# Patient Record
Sex: Male | Born: 1970 | Race: Black or African American | Hispanic: No | Marital: Single
Health system: Western US, Academic
[De-identification: ages and names within clinical notes are randomized; demographics above are authoritative.]

## PROBLEM LIST (undated history)

## (undated) DIAGNOSIS — I255 Ischemic cardiomyopathy: Secondary | ICD-10-CM

## (undated) DIAGNOSIS — I509 Heart failure, unspecified: Secondary | ICD-10-CM

## (undated) DIAGNOSIS — E785 Hyperlipidemia, unspecified: Secondary | ICD-10-CM

## (undated) DIAGNOSIS — Z95810 Presence of automatic (implantable) cardiac defibrillator: Secondary | ICD-10-CM

## (undated) DIAGNOSIS — I10 Essential (primary) hypertension: Secondary | ICD-10-CM

## (undated) DIAGNOSIS — E119 Type 2 diabetes mellitus without complications: Secondary | ICD-10-CM

## (undated) DIAGNOSIS — J8489 Other specified interstitial pulmonary diseases: Secondary | ICD-10-CM

## (undated) DIAGNOSIS — G8929 Other chronic pain: Secondary | ICD-10-CM

## (undated) HISTORY — DX: Essential (primary) hypertension: I10

## (undated) HISTORY — DX: Ischemic cardiomyopathy: I25.5

## (undated) HISTORY — DX: Hyperlipidemia, unspecified: E78.5

## (undated) HISTORY — DX: Presence of automatic (implantable) cardiac defibrillator: Z95.810

## (undated) HISTORY — PX: CHOLECYSTECTOMY: SHX55

---

## 2002-11-28 DIAGNOSIS — E1142 Type 2 diabetes mellitus with diabetic polyneuropathy: Secondary | ICD-10-CM | POA: Insufficient documentation

## 2019-09-10 HISTORY — PX: CARDIAC DEFIBRILLATOR PLACEMENT: SHX171

## 2020-09-21 ENCOUNTER — Encounter: Payer: Self-pay | Admitting: *Deleted

## 2020-09-21 ENCOUNTER — Emergency Department
Admission: EM | Admit: 2020-09-21 | Discharge: 2020-09-21 | Disposition: A | Discharge status: Home / Self Care | Payer: Self-pay | Attending: Emergency Medicine | Admitting: Emergency Medicine

## 2020-09-21 ENCOUNTER — Other Ambulatory Visit: Payer: Self-pay

## 2020-09-21 DIAGNOSIS — F17200 Nicotine dependence, unspecified, uncomplicated: Secondary | ICD-10-CM | POA: Insufficient documentation

## 2020-09-21 DIAGNOSIS — R112 Nausea with vomiting, unspecified: Secondary | ICD-10-CM | POA: Insufficient documentation

## 2020-09-21 DIAGNOSIS — R1084 Generalized abdominal pain: Secondary | ICD-10-CM | POA: Insufficient documentation

## 2020-09-21 DIAGNOSIS — M549 Dorsalgia, unspecified: Secondary | ICD-10-CM | POA: Insufficient documentation

## 2020-09-21 DIAGNOSIS — R197 Diarrhea, unspecified: Secondary | ICD-10-CM | POA: Insufficient documentation

## 2020-09-21 LAB — URINALYSIS, COMPLETE (UACMP) WITH MICROSCOPIC
Bacteria, UA: NONE SEEN
Bilirubin Urine: NEGATIVE
Glucose, UA: NEGATIVE mg/dL
Ketones, ur: NEGATIVE mg/dL
Leukocytes,Ua: NEGATIVE
Nitrite: NEGATIVE
Protein, ur: >=300 mg/dL — AB
Specific Gravity, Urine: 1.031 — ABNORMAL HIGH (ref 1.005–1.030)
pH: 5 (ref 5.0–8.0)

## 2020-09-21 LAB — COMPREHENSIVE METABOLIC PANEL
ALT: 57 U/L — ABNORMAL HIGH (ref 0–44)
AST: 38 U/L (ref 15–41)
Albumin: 3 g/dL — ABNORMAL LOW (ref 3.5–5.0)
Alkaline Phosphatase: 312 U/L — ABNORMAL HIGH (ref 38–126)
Anion gap: 8 (ref 5–15)
BUN: 16 mg/dL (ref 6–20)
CO2: 23 mmol/L (ref 22–32)
Calcium: 7.5 mg/dL — ABNORMAL LOW (ref 8.9–10.3)
Chloride: 108 mmol/L (ref 98–111)
Creatinine, Ser: 0.69 mg/dL (ref 0.61–1.24)
GFR, Estimated: >60 mL/min (ref >60)
Glucose, Bld: 135 mg/dL — ABNORMAL HIGH (ref 70–99)
Potassium: 3.6 mmol/L (ref 3.5–5.1)
Sodium: 139 mmol/L (ref 135–145)
Total Bilirubin: 1 mg/dL (ref 0.3–1.2)
Total Protein: 6.9 g/dL (ref 6.5–8.1)

## 2020-09-21 LAB — CBC
HCT: 39.2 % (ref 39.0–52.0)
Hemoglobin: 12.4 g/dL — ABNORMAL LOW (ref 13.0–17.0)
MCH: 29 pg (ref 26.0–34.0)
MCHC: 31.6 g/dL (ref 30.0–36.0)
MCV: 91.8 fL (ref 80.0–100.0)
Platelets: 238 10*3/uL (ref 150–400)
RBC: 4.27 MIL/uL (ref 4.22–5.81)
RDW: 15.5 % (ref 11.5–15.5)
WBC: 3.5 10*3/uL — ABNORMAL LOW (ref 4.0–10.5)
nRBC: 0 % (ref 0.0–0.2)

## 2020-09-21 LAB — LIPASE, BLOOD: Lipase: 30 U/L (ref 11–51)

## 2020-09-21 MED ORDER — PROMETHAZINE HCL 12.5 MG PO TABS: 12.5000 mg | ORAL_TABLET | Freq: Three times a day (TID) | ORAL | 0 refills | Status: DC | PRN

## 2020-09-21 MED ORDER — OMEPRAZOLE 20 MG PO CPDR: 20.0000 mg | DELAYED_RELEASE_CAPSULE | Freq: Every day | ORAL | 0 refills | Status: DC

## 2020-09-21 NOTE — ED Triage Notes (Signed)
Pt ambulatory to triage.  Pt has vomiting and diarrhea.  Hx pancreatitis.  Sx for 3 days.  Diarrhea x 3 today.  Pt alert speech clear.

## 2020-09-21 NOTE — ED Provider Notes (Signed)
[facility] Emergency Department Provider Note  ____________________________________________   First MD Initiated Contact with Patient 09/21/20 2131     (approximate)  I have reviewed the triage vital signs and the nursing notes.   HISTORY  Chief Complaint Emesis and Diarrhea    HPI Travis Lucas is a 49 y.o. male  Here with nausea, vomiting, diarrhea. Pt states that starting Saturday, he has had mild diffuse abdominal and back pain, nausea, vomiting, and diarrhea. He repots a h/o pancreatitis and states his current sx feel similar to his prior episodes. Reports the pain is mild, not particularly worse with eating or drinking, and non-localized though he feels it slightly more in the back. No flank pain, dysuria, frequency. Pain is not constant. No urinary sx. He has had some loose stools but no blood or mucus in stools. His emesis is random and he's been able to eat/drink without difficulty. He states that last time he had these sx, he took an OTC antacid which "worked [employer]."        No past medical history on file.  PMHx: Non contributory  PSHx: None  SHx: occasional EtOH  There are no problems to display for this patient.     Prior to Admission medications   Medication Sig Start Date End Date Taking? Authorizing Provider  omeprazole (PRILOSEC) 20 MG capsule Take 1 capsule (20 mg total) by mouth daily for 14 days. 09/21/20 10/05/20  Shaune Pollack, MD  promethazine (PHENERGAN) 12.5 MG tablet Take 1 tablet (12.5 mg total) by mouth every 8 (eight) hours as needed for nausea or vomiting. 09/21/20   Shaune Pollack, MD    Allergies Penicillins  No family history on file.  Social History Social History   Tobacco Use  . Smoking status: Current Every Day Smoker  . Smokeless tobacco: Never Used  Substance Use Topics  . Alcohol use: Yes  . Drug use: Not Currently    Review of Systems  Review of Systems  Constitutional: Positive for  fatigue. Negative for chills and fever.  HENT: Negative for sore throat.   Respiratory: Negative for shortness of breath.   Cardiovascular: Negative for chest pain.  Gastrointestinal: Positive for abdominal pain, diarrhea, nausea and vomiting.  Genitourinary: Negative for flank pain.  Musculoskeletal: Negative for neck pain.  Skin: Negative for rash and wound.  Allergic/Immunologic: Negative for immunocompromised state.  Neurological: Negative for weakness and numbness.  Hematological: Does not bruise/bleed easily.  All other systems reviewed and are negative.    ____________________________________________  PHYSICAL EXAM:      VITAL SIGNS: ED Triage Vitals  Enc Vitals Group     BP 09/21/20 1830 128/85     Pulse Rate 09/21/20 1830 100     Resp 09/21/20 1830 20     Temp 09/21/20 1830 98.2 F (36.8 C)     Temp Source 09/21/20 1830 Oral     SpO2 09/21/20 1830 100 %     Weight 09/21/20 1831 180 lb (81.6 kg)     Height 09/21/20 1831 6\' 3"  (1.905 m)     Head Circumference --      Peak Flow --      Pain Score 09/21/20 1831 4     Pain Loc --      Pain Edu? --      Excl. in GC? --      Physical Exam Vitals and nursing note reviewed.  Constitutional:      General: He is not in acute  distress.    Appearance: He is well-developed.  HENT:     Head: Normocephalic and atraumatic.  Eyes:     Conjunctiva/sclera: Conjunctivae normal.  Cardiovascular:     Rate and Rhythm: Normal rate and regular rhythm.     Heart sounds: Normal heart sounds.  Pulmonary:     Effort: Pulmonary effort is normal. No respiratory distress.     Breath sounds: No wheezing.  Abdominal:     General: Abdomen is flat. There is no distension.     Tenderness: There is no abdominal tenderness. There is no guarding or rebound.     Comments: No focal TTP. No CVAT Bilaterally.  Musculoskeletal:     Cervical back: Neck supple.  Skin:    General: Skin is warm.     Capillary Refill: Capillary refill takes less  than 2 seconds.     Findings: No rash.  Neurological:     Mental Status: He is alert and oriented to person, place, and time.     Motor: No abnormal muscle tone.       ____________________________________________   LABS (all labs ordered are listed, but only abnormal results are displayed)  Labs Reviewed  COMPREHENSIVE METABOLIC PANEL - Abnormal; Notable for the following components:      Result Value   Glucose, Bld 135 (*)    Calcium 7.5 (*)    Albumin 3.0 (*)    ALT 57 (*)    Alkaline Phosphatase 312 (*)    All other components within normal limits  CBC - Abnormal; Notable for the following components:   WBC 3.5 (*)    Hemoglobin 12.4 (*)    All other components within normal limits  URINALYSIS, COMPLETE (UACMP) WITH MICROSCOPIC - Abnormal; Notable for the following components:   Color, Urine AMBER (*)    APPearance HAZY (*)    Specific Gravity, Urine 1.031 (*)    Hgb urine dipstick SMALL (*)    Protein, ur >=300 (*)    All other components within normal limits  LIPASE, BLOOD    ____________________________________________  EKG:  ________________________________________  RADIOLOGY All imaging, including plain films, CT scans, and ultrasounds, independently reviewed by me, and interpretations confirmed via formal radiology reads.  ED MD interpretation:     Official radiology report(s): No results found.  ____________________________________________  PROCEDURES   Procedure(s) performed (including Critical Care):  Procedures  ____________________________________________  INITIAL IMPRESSION / MDM / ASSESSMENT AND PLAN / ED COURSE  As part of my medical decision making, I reviewed the following data within the electronic MEDICAL RECORD NUMBER Nursing notes reviewed and incorporated, Old chart reviewed, Notes from prior ED visits, and [place] Controlled Substance Database       *Yedidya Duddy was evaluated in Emergency Department on 09/21/2020 for the symptoms  described in the history of present illness. He was evaluated in the context of the global COVID-19 pandemic, which necessitated consideration that the patient might be at risk for infection with the SARS-CoV-2 virus that causes COVID-19. Institutional protocols and algorithms that pertain to the evaluation of patients at risk for COVID-19 are in a state of rapid change based on information released by regulatory bodies including the CDC and federal and state organizations. These policies and algorithms were followed during the patient's care in the ED.  Some ED evaluations and interventions may be delayed as a result of limited staffing during the pandemic.*     Medical Decision Making:  49 yo M here with n/v/d, mild diffuse abd  pain. On arrival, pt in no distress with no focal abd TTP. Labs show minimal anemia, slight ALT/AlkP elevation though normal LFTs and Bili. Hypocalcemia noted but only mild when corrected for albumin. Lipase is normal. UA c/w dehydration but otherwise not significant. Possible small blood noted - flank pain is b/l mild, doubt stone. No focal TTP to suggest cholecystitis, appendicitis, diverticulitis.  Discussed labs, sx with pt. Discussed that primary suspicion is viral GI illness vs recurrent gastritis/GERD vs food-borne illness. However, given his h/o prior pancreatitis, offered imaging for further evaluation. Pt states he feels much better on his own and declines further imaging. He would like to attempt outpt management. Will d/c with antacids, antiemetics and good return precautions.  ____________________________________________  FINAL CLINICAL IMPRESSION(S) / ED DIAGNOSES  Final diagnoses:  Nausea vomiting and diarrhea  Generalized abdominal pain     MEDICATIONS GIVEN DURING THIS VISIT:  Medications - No data to display   ED Discharge Orders         Ordered    omeprazole (PRILOSEC) 20 MG capsule  Daily        09/21/20 2146    promethazine (PHENERGAN) 12.5 MG  tablet  Every 8 hours PRN        09/21/20 2146           Note:  This document was prepared using Dragon voice recognition software and may include unintentional dictation errors.   Shaune Pollack, MD 09/21/20 2157

## 2020-09-21 NOTE — Discharge Instructions (Signed)
You can take over-the-counter alternatives for antacids if needed/not covered by the pharmacy.  For your pain/indigestion: - Take a generic antacid such as omeprazole, lansoprazole, esomeprazole (PPI medications). The cheapest generic should be more than enough to help. - If severe, take Tums or Maalox as needed  Avoid alcohol and foods high in fat  Drink plenty of fluid

## 2020-09-26 ENCOUNTER — Other Ambulatory Visit: Payer: Self-pay

## 2020-09-26 ENCOUNTER — Emergency Department: Payer: Self-pay

## 2020-09-26 ENCOUNTER — Emergency Department
Admission: EM | Admit: 2020-09-26 | Discharge: 2020-09-26 | Disposition: A | Discharge status: Home / Self Care | Payer: Self-pay | Attending: Emergency Medicine | Admitting: Emergency Medicine

## 2020-09-26 ENCOUNTER — Encounter: Payer: Self-pay | Admitting: Emergency Medicine

## 2020-09-26 DIAGNOSIS — F17200 Nicotine dependence, unspecified, uncomplicated: Secondary | ICD-10-CM | POA: Insufficient documentation

## 2020-09-26 DIAGNOSIS — R0602 Shortness of breath: Secondary | ICD-10-CM | POA: Insufficient documentation

## 2020-09-26 LAB — CBC
HCT: 34.4 % — ABNORMAL LOW (ref 39.0–52.0)
Hemoglobin: 11.1 g/dL — ABNORMAL LOW (ref 13.0–17.0)
MCH: 28.8 pg (ref 26.0–34.0)
MCHC: 32.3 g/dL (ref 30.0–36.0)
MCV: 89.4 fL (ref 80.0–100.0)
Platelets: 241 10*3/uL (ref 150–400)
RBC: 3.85 MIL/uL — ABNORMAL LOW (ref 4.22–5.81)
RDW: 15 % (ref 11.5–15.5)
WBC: 4.8 10*3/uL (ref 4.0–10.5)
nRBC: 0 % (ref 0.0–0.2)

## 2020-09-26 LAB — TROPONIN I (HIGH SENSITIVITY): Troponin I (High Sensitivity): 17 ng/L (ref <18)

## 2020-09-26 LAB — COMPREHENSIVE METABOLIC PANEL
ALT: 36 U/L (ref 0–44)
AST: 28 U/L (ref 15–41)
Albumin: 2.5 g/dL — ABNORMAL LOW (ref 3.5–5.0)
Alkaline Phosphatase: 391 U/L — ABNORMAL HIGH (ref 38–126)
Anion gap: 7 (ref 5–15)
BUN: 7 mg/dL (ref 6–20)
CO2: 22 mmol/L (ref 22–32)
Calcium: 7.3 mg/dL — ABNORMAL LOW (ref 8.9–10.3)
Chloride: 110 mmol/L (ref 98–111)
Creatinine, Ser: 0.74 mg/dL (ref 0.61–1.24)
GFR, Estimated: >60 mL/min (ref >60)
Glucose, Bld: 170 mg/dL — ABNORMAL HIGH (ref 70–99)
Potassium: 3.2 mmol/L — ABNORMAL LOW (ref 3.5–5.1)
Sodium: 139 mmol/L (ref 135–145)
Total Bilirubin: 0.7 mg/dL (ref 0.3–1.2)
Total Protein: 5.9 g/dL — ABNORMAL LOW (ref 6.5–8.1)

## 2020-09-26 MED ORDER — DOXYCYCLINE HYCLATE 50 MG PO CAPS: 100.0000 mg | ORAL_CAPSULE | Freq: Two times a day (BID) | ORAL | 0 refills | Status: AC

## 2020-09-26 NOTE — ED Notes (Signed)
Reviewed discharge instructions, follow-up care, and prescriptions with patient. Patient verbalized understanding of all information reviewed. Patient stable, with no distress noted at this time.  Patient brought snack and beverage prior to discharge by this RN.

## 2020-09-26 NOTE — ED Provider Notes (Addendum)
[facility] Emergency Department Provider Note   ____________________________________________   First MD Initiated Contact with Patient 09/26/20 0406     (approximate)  I have reviewed the triage vital signs and the nursing notes.   HISTORY  Chief Complaint Shortness of Breath    HPI Travis Lucas is a 49 y.o. 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  Patient states he has had multiple symptoms within the past 2 weeks including palpitations, shortness of breath, generalized weakness, a bleeding lesion to the crown of his head, and subjective fever.  Patient states that he was recently admitted to hospital in [facility] where he was receiving IV antibiotics for a "MRSA blood infection".  Patient states that he was in 3/4 weeks of IV treatment with a PICC line when he was presented the opportunity to use an experimental antibiotic.  Patient states that he got his first dose but was scheduled to have the second yesterday and was unable to receive it as he was not in [facility].  Patient is unclear as to his exact diagnosis.  Patient denies any IV drug use, known history of HIV, or any personal/family history of immunocompromising syndromes         History reviewed. No pertinent past medical history.  There are no problems to display for this patient.     Prior to Admission medications   Medication Sig Start Date End Date Taking? Authorizing Provider  doxycycline (VIBRAMYCIN) 50 MG capsule Take 2 capsules (100 mg total) by mouth 2 (two) times daily for 5 days. 09/26/20 10/01/20  Merwyn Katos, MD  omeprazole (PRILOSEC) 20 MG capsule Take 1 capsule (20 mg total) by mouth daily for 14 days. 09/21/20 10/05/20  Shaune Pollack, MD  promethazine (PHENERGAN) 12.5 MG tablet Take 1 tablet (12.5 mg total) by mouth every 8 (eight) hours as needed for nausea or vomiting. 09/21/20    Shaune Pollack, MD    Allergies Penicillins  No family history on file.  Social History Social History   Tobacco Use  . Smoking status: Current Every Day Smoker  . Smokeless tobacco: Never Used  Substance Use Topics  . Alcohol use: Yes  . Drug use: Not Currently    Review of Systems Constitutional: Positive fever/chills Eyes: No visual changes. ENT: No sore throat. Cardiovascular: Endorses chest pain. Respiratory: Endorses shortness of breath. Gastrointestinal: No abdominal pain.  No nausea, no vomiting.  No diarrhea. Genitourinary: Negative for dysuria. Musculoskeletal: Negative for acute arthralgias Skin: Negative for rash. Neurological: Negative for headaches, numbness/paresthesias in any extremity Psychiatric: Negative for suicidal ideation/homicidal ideation   ____________________________________________   PHYSICAL EXAM:  VITAL SIGNS: ED Triage Vitals  Enc Vitals Group     BP 09/26/20 0103 134/88     Pulse Rate 09/26/20 0103 100     Resp 09/26/20 0103 18     Temp 09/26/20 0103 98.1 F (36.7 C)     Temp Source 09/26/20 0103 Oral     SpO2 09/26/20 0103 97 %     Weight 09/26/20 0104 185 lb (83.9 kg)     Height 09/26/20 0104 6\' 3"  (1.905 m)     Head Circumference --      Peak Flow --      Pain Score 09/26/20 0104 7     Pain Loc --      Pain Edu? --      Excl.  in GC? --    Constitutional: Alert and oriented. Well appearing and in no acute distress. Eyes: Conjunctivae are normal. PERRL. Head: Atraumatic. Nose: No congestion/rhinnorhea. Mouth/Throat: Mucous membranes are moist. Neck: No stridor Cardiovascular: Grossly normal heart sounds.  Good peripheral circulation. Respiratory: Normal respiratory effort.  No retractions. Gastrointestinal: Soft and nontender. No distention. Musculoskeletal: No obvious deformities Neurologic:  Normal speech and language. No gross focal neurologic deficits are appreciated. Skin:  Skin is warm and dry. No rash  noted. Psychiatric: Mood and affect are normal. Speech and behavior are normal.  ____________________________________________   LABS (all labs ordered are listed, but only abnormal results are displayed)  Labs Reviewed  CBC - Abnormal; Notable for the following components:      Result Value   RBC 3.85 (*)    Hemoglobin 11.1 (*)    HCT 34.4 (*)    All other components within normal limits  COMPREHENSIVE METABOLIC PANEL - Abnormal; Notable for the following components:   Potassium 3.2 (*)    Glucose, Bld 170 (*)    Calcium 7.3 (*)    Total Protein 5.9 (*)    Albumin 2.5 (*)    Alkaline Phosphatase 391 (*)    All other components within normal limits  TROPONIN I (HIGH SENSITIVITY)  TROPONIN I (HIGH SENSITIVITY)   ____________________________________________  EKG  ED ECG REPORT I, Merwyn Katos, the attending physician, personally viewed and interpreted this ECG.  Date: 09/26/2020 EKG Time: 0104 Rate: 101 Rhythm: Tachycardic sinus rhythm QRS Axis: normal Intervals: normal ST/T Wave abnormalities: normal Narrative Interpretation: no evidence of acute ischemia  ____________________________________________  RADIOLOGY  ED MD interpretation: 2 view x-ray of the chest shows mild cardiomegaly without any significant evidence of acute cardiopulmonary pathology.  No evidence of pneumonia, pneumothorax, or widened mediastinum  Official radiology report(s): DG Chest 2 View  Result Date: 09/26/2020 CLINICAL DATA:  Dyspnea EXAM: CHEST - 2 VIEW COMPARISON:  None. FINDINGS: Lungs are clear. No pneumothorax or pleural effusion. Left subclavian dual lead pacemaker defibrillator is in expected position. Mild cardiomegaly. Pulmonary vascularity is normal. No acute bone abnormality. IMPRESSION: No active cardiopulmonary disease.  Mild cardiomegaly. Electronically Signed   By: Helyn Numbers MD   On: 09/26/2020 02:05     ____________________________________________   PROCEDURES  Procedure(s) performed (including Critical Care):  .1-3 Lead EKG Interpretation Performed by: Merwyn Katos, MD Authorized by: Merwyn Katos, MD     Interpretation: normal     ECG rate:  93   ECG rate assessment: normal     Rhythm: sinus rhythm     Ectopy: none     Conduction: normal       ____________________________________________   INITIAL IMPRESSION / ASSESSMENT AND PLAN / ED COURSE  As part of my medical decision making, I reviewed the following data within the electronic MEDICAL RECORD NUMBER Nursing notes reviewed and incorporated, Labs reviewed, EKG interpreted, Old chart reviewed, Radiograph reviewed and Notes from prior ED visits reviewed and incorporated        The patient is suffering from shortness of breath, but the immediate cause is not apparent.  Potential causes considered include, but are not limited to, asthma or COPD, congestive heart failure, pulmonary embolism, pneumothorax, coronary syndrome, pneumonia, and pleural effusion.  Despite the evaluation including history, exam, and testing, the cause of the shortness of breath remains unclear. However, during the ED stay, patients condition improved, and at the time of discharge the shortness of breath is resolved, they  are feeling well, and want to go home.  Patient will be discharged with strict return precautions and advice to follow up with primary MD within 24 hours for further evaluation.      ____________________________________________   FINAL CLINICAL IMPRESSION(S) / ED DIAGNOSES  Final diagnoses:  SOB (shortness of breath)     ED Discharge Orders         Ordered    doxycycline (VIBRAMYCIN) 50 MG capsule  2 times daily        09/26/20 0531           Note:  This document was prepared using Dragon voice recognition software and may include unintentional dictation errors.   Merwyn Katos, MD 09/26/20 3716     Merwyn Katos, MD 09/26/20 [phone]

## 2020-09-26 NOTE — ED Notes (Addendum)
Patient ambulatory to lobby with steady gait.

## 2020-09-26 NOTE — ED Triage Notes (Addendum)
Patient brought in by ems. Patient with complaint of shortness of breath that started an hour ago. Patient states that he was diagnosed with MRSA in his blood stream. Patient states that he was suppose to go have an experimental antibiotics but he was not able to get to his appointment yesterday. Patient with complaint of back pain. Patient states that he has a history of pancreatitis and this pain feels the same as when he has had pancreatitis in the past.

## 2020-09-26 NOTE — ED Notes (Signed)
ED Provider at bedside. 

## 2020-09-26 NOTE — ED Notes (Signed)
Patient reports that he was just recently released [place] state. Patient reports being dx with MRSA; patient was receiving IV Vancomycin in the hospital. Patient reports that the hospital physicians recommended he be discharged with a PICC line for IV antibiotics, and were willing to set him up in a hotel with a home health nurse that would visit him as he is homeless. Patient reports that instead he opted to stay with his sisters here in [place] and receive IM doses of antibiotic. Patient unsure of name of antibiotic, but states it was experimental. Patient was supposed to take dose yesterday but missed his dose.   Patient reports new wounds/sores to the top of his head that he believes to be infected with MRSA as he missed his dose of IM antibiotic.   Patient c/o lower right back pain, reports he thinks it's his pancreas radiating pain to the area.

## 2020-10-26 ENCOUNTER — Inpatient Hospital Stay
Admission: EM | Admit: 2020-10-26 | Discharge: 2020-11-02 | DRG: 291 | Disposition: A | Discharge status: Home / Self Care | Payer: Self-pay | Attending: Internal Medicine | Admitting: Internal Medicine

## 2020-10-26 ENCOUNTER — Emergency Department: Payer: Self-pay

## 2020-10-26 ENCOUNTER — Other Ambulatory Visit: Payer: Self-pay

## 2020-10-26 DIAGNOSIS — R778 Other specified abnormalities of plasma proteins: Secondary | ICD-10-CM | POA: Diagnosis present

## 2020-10-26 DIAGNOSIS — G8929 Other chronic pain: Secondary | ICD-10-CM | POA: Diagnosis present

## 2020-10-26 DIAGNOSIS — I5023 Acute on chronic systolic (congestive) heart failure: Secondary | ICD-10-CM | POA: Diagnosis present

## 2020-10-26 DIAGNOSIS — Z91138 Patient's unintentional underdosing of medication regimen for other reason: Secondary | ICD-10-CM

## 2020-10-26 DIAGNOSIS — I10 Essential (primary) hypertension: Secondary | ICD-10-CM

## 2020-10-26 DIAGNOSIS — T447X6A Underdosing of beta-adrenoreceptor antagonists, initial encounter: Secondary | ICD-10-CM | POA: Diagnosis present

## 2020-10-26 DIAGNOSIS — F1290 Cannabis use, unspecified, uncomplicated: Secondary | ICD-10-CM | POA: Diagnosis present

## 2020-10-26 DIAGNOSIS — Z20822 Contact with and (suspected) exposure to covid-19: Secondary | ICD-10-CM | POA: Diagnosis present

## 2020-10-26 DIAGNOSIS — I110 Hypertensive heart disease with heart failure: Principal | ICD-10-CM | POA: Diagnosis present

## 2020-10-26 DIAGNOSIS — J84116 Cryptogenic organizing pneumonia: Secondary | ICD-10-CM | POA: Diagnosis present

## 2020-10-26 DIAGNOSIS — IMO0001 Reserved for inherently not codable concepts without codable children: Secondary | ICD-10-CM

## 2020-10-26 DIAGNOSIS — F17210 Nicotine dependence, cigarettes, uncomplicated: Secondary | ICD-10-CM | POA: Diagnosis present

## 2020-10-26 DIAGNOSIS — Z8614 Personal history of Methicillin resistant Staphylococcus aureus infection: Secondary | ICD-10-CM

## 2020-10-26 DIAGNOSIS — Z880 Allergy status to penicillin: Secondary | ICD-10-CM

## 2020-10-26 DIAGNOSIS — Z794 Long term (current) use of insulin: Secondary | ICD-10-CM

## 2020-10-26 DIAGNOSIS — E785 Hyperlipidemia, unspecified: Secondary | ICD-10-CM | POA: Diagnosis present

## 2020-10-26 DIAGNOSIS — M545 Low back pain, unspecified: Secondary | ICD-10-CM | POA: Diagnosis present

## 2020-10-26 DIAGNOSIS — D649 Anemia, unspecified: Secondary | ICD-10-CM | POA: Diagnosis present

## 2020-10-26 DIAGNOSIS — T501X6A Underdosing of loop [high-ceiling] diuretics, initial encounter: Secondary | ICD-10-CM | POA: Diagnosis present

## 2020-10-26 DIAGNOSIS — I4510 Unspecified right bundle-branch block: Secondary | ICD-10-CM | POA: Diagnosis present

## 2020-10-26 DIAGNOSIS — I428 Other cardiomyopathies: Secondary | ICD-10-CM | POA: Diagnosis present

## 2020-10-26 DIAGNOSIS — E119 Type 2 diabetes mellitus without complications: Secondary | ICD-10-CM

## 2020-10-26 DIAGNOSIS — E1165 Type 2 diabetes mellitus with hyperglycemia: Secondary | ICD-10-CM | POA: Diagnosis present

## 2020-10-26 DIAGNOSIS — E876 Hypokalemia: Secondary | ICD-10-CM | POA: Diagnosis not present

## 2020-10-26 DIAGNOSIS — J8489 Other specified interstitial pulmonary diseases: Secondary | ICD-10-CM | POA: Diagnosis present

## 2020-10-26 DIAGNOSIS — I160 Hypertensive urgency: Secondary | ICD-10-CM | POA: Diagnosis present

## 2020-10-26 DIAGNOSIS — I509 Heart failure, unspecified: Secondary | ICD-10-CM

## 2020-10-26 DIAGNOSIS — I2510 Atherosclerotic heart disease of native coronary artery without angina pectoris: Secondary | ICD-10-CM | POA: Diagnosis present

## 2020-10-26 DIAGNOSIS — Z95810 Presence of automatic (implantable) cardiac defibrillator: Secondary | ICD-10-CM

## 2020-10-26 DIAGNOSIS — K219 Gastro-esophageal reflux disease without esophagitis: Secondary | ICD-10-CM | POA: Diagnosis present

## 2020-10-26 DIAGNOSIS — Z5900 Homelessness unspecified: Secondary | ICD-10-CM

## 2020-10-26 DIAGNOSIS — F17200 Nicotine dependence, unspecified, uncomplicated: Secondary | ICD-10-CM

## 2020-10-26 DIAGNOSIS — Z79899 Other long term (current) drug therapy: Secondary | ICD-10-CM

## 2020-10-26 HISTORY — DX: Heart failure, unspecified: I50.9

## 2020-10-26 HISTORY — DX: Other specified interstitial pulmonary diseases: J84.89

## 2020-10-26 HISTORY — DX: Type 2 diabetes mellitus without complications: E11.9

## 2020-10-26 HISTORY — DX: Other chronic pain: G89.29

## 2020-10-26 LAB — CBC
HCT: 35.9 % — ABNORMAL LOW (ref 39.0–52.0)
Hemoglobin: 11.2 g/dL — ABNORMAL LOW (ref 13.0–17.0)
MCH: 27.9 pg (ref 26.0–34.0)
MCHC: 31.2 g/dL (ref 30.0–36.0)
MCV: 89.3 fL (ref 80.0–100.0)
Platelets: 227 10*3/uL (ref 150–400)
RBC: 4.02 MIL/uL — ABNORMAL LOW (ref 4.22–5.81)
RDW: 15.9 % — ABNORMAL HIGH (ref 11.5–15.5)
WBC: 5.8 10*3/uL (ref 4.0–10.5)
nRBC: 0 % (ref 0.0–0.2)

## 2020-10-26 LAB — BASIC METABOLIC PANEL
Anion gap: 7 (ref 5–15)
BUN: 22 mg/dL — ABNORMAL HIGH (ref 6–20)
CO2: 24 mmol/L (ref 22–32)
Calcium: 7.7 mg/dL — ABNORMAL LOW (ref 8.9–10.3)
Chloride: 105 mmol/L (ref 98–111)
Creatinine, Ser: 0.96 mg/dL (ref 0.61–1.24)
GFR, Estimated: >60 mL/min (ref >60)
Glucose, Bld: 364 mg/dL — ABNORMAL HIGH (ref 70–99)
Potassium: 4.3 mmol/L (ref 3.5–5.1)
Sodium: 136 mmol/L (ref 135–145)

## 2020-10-26 LAB — BRAIN NATRIURETIC PEPTIDE: B Natriuretic Peptide: 1396.1 pg/mL — ABNORMAL HIGH (ref 0.0–100.0)

## 2020-10-26 LAB — TROPONIN I (HIGH SENSITIVITY): Troponin I (High Sensitivity): 20 ng/L — ABNORMAL HIGH (ref <18)

## 2020-10-26 MED ORDER — FUROSEMIDE 10 MG/ML IJ SOLN: 20.0000 mg | Freq: Once | INTRAMUSCULAR | Status: DC

## 2020-10-26 MED ORDER — LABETALOL HCL 5 MG/ML IV SOLN
10.0000 mg | INTRAVENOUS | Status: DC | PRN
  Administered 2020-10-27: 10 mg via INTRAVENOUS
  Filled 2020-10-26: qty 4

## 2020-10-26 MED ORDER — METOPROLOL SUCCINATE ER 25 MG PO TB24
25.0000 mg | ORAL_TABLET | Freq: Every day | ORAL | Status: DC
  Administered 2020-10-27: 25 mg via ORAL
  Filled 2020-10-26: qty 1

## 2020-10-26 MED ORDER — FUROSEMIDE 10 MG/ML IJ SOLN
40.0000 mg | Freq: Two times a day (BID) | INTRAMUSCULAR | Status: DC
  Administered 2020-10-27 – 2020-10-31 (×10): 40 mg via INTRAVENOUS
  Filled 2020-10-26 (×11): qty 4

## 2020-10-26 NOTE — ED Provider Notes (Signed)
[facility] Emergency Department Provider Note   ____________________________________________   First MD Initiated Contact with Patient 10/26/20 2136     (approximate)  I have reviewed the triage vital signs and the nursing notes.   HISTORY  Chief Complaint Leg Swelling    HPI Travis Lucas is a 49 y.o. male patient reports lower extremity swelling for several weeks getting worse.  Short of breath about the same amount of time.  He has a history of CHF and Boop.  The Boop apparently because of CHF.  He has an implantable defibrillator.         Past Medical History:  Diagnosis Date  . BOOP (bronchiolitis obliterans with organizing pneumonia) (HCC)   . CHF (congestive heart failure) (HCC)   . Chronic back pain   . Diabetes mellitus without complication (HCC)     There are no problems to display for this patient.   Past Surgical History:  Procedure Laterality Date  . CARDIAC DEFIBRILLATOR PLACEMENT  09/10/2019  . CHOLECYSTECTOMY      Prior to Admission medications   Medication Sig Start Date End Date Taking? Authorizing Provider  omeprazole (PRILOSEC) 20 MG capsule Take 1 capsule (20 mg total) by mouth daily for 14 days. 09/21/20 10/05/20  Shaune Pollack, MD  promethazine (PHENERGAN) 12.5 MG tablet Take 1 tablet (12.5 mg total) by mouth every 8 (eight) hours as needed for nausea or vomiting. 09/21/20   Shaune Pollack, MD    Allergies Penicillins  No family history on file.  Social History Social History   Tobacco Use  . Smoking status: Current Every Day Smoker  . Smokeless tobacco: Never Used  . Tobacco comment: 1 cigarrette a day   Vaping Use  . Vaping Use: Never used  Substance Use Topics  . Alcohol use: Yes    Comment: "socially"   . Drug use: Yes    Types: Marijuana    Review of Systems  Constitutional: No fever/chills Eyes: No visual changes. ENT: No sore throat. Cardiovascular: Denies chest pain. Respiratory:   shortness of breath. Gastrointestinal: No abdominal pain.  No nausea, no vomiting.  No diarrhea.  No constipation. Genitourinary: Negative for dysuria. Musculoskeletal: Negative for back pain. Skin: Negative for rash. Neurological: Negative for headaches, focal weakness   ____________________________________________   PHYSICAL EXAM:  VITAL SIGNS: ED Triage Vitals  Enc Vitals Group     BP 10/26/20 1541 (!) 129/92     Pulse Rate 10/26/20 1541 (!) 102     Resp 10/26/20 1541 18     Temp 10/26/20 1541 98.9 F (37.2 C)     Temp Source 10/26/20 1541 Oral     SpO2 10/26/20 1541 96 %     Weight 10/26/20 1545 180 lb (81.6 kg)     Height 10/26/20 1545 6\' 3"  (1.905 m)     Head Circumference --      Peak Flow --      Pain Score 10/26/20 1545 8     Pain Loc --      Pain Edu? --      Excl. in GC? --     Constitutional: Alert and oriented. Well appearing and in no acute distress. Eyes: Conjunctivae are normal. PER Head: Atraumatic. Nose: No congestion/rhinnorhea. Mouth/Throat: Mucous membranes are moist.  Oropharynx non-erythematous. Neck: No stridor.   Cardiovascular: Normal rate, regular rhythm. Grossly normal heart sounds.  Good peripheral circulation. Respiratory: Normal respiratory effort.  No retractions. Lungs crackles in bases Gastrointestinal: Soft and nontender.  No distention. No abdominal bruits.  Musculoskeletal: No lower extremity tenderness Marked edema.   Neurologic:  Normal speech and language. No gross focal neurologic deficits are appreciated.  Skin:  Skin is warm, dry and intact. No rash noted.   ____________________________________________   LABS (all labs ordered are listed, but only abnormal results are displayed)  Labs Reviewed  BASIC METABOLIC PANEL - Abnormal; Notable for the following components:      Result Value   Glucose, Bld 364 (*)    BUN 22 (*)    Calcium 7.7 (*)    All other components within normal limits  CBC - Abnormal; Notable for the  following components:   RBC 4.02 (*)    Hemoglobin 11.2 (*)    HCT 35.9 (*)    RDW 15.9 (*)    All other components within normal limits  BRAIN NATRIURETIC PEPTIDE - Abnormal; Notable for the following components:   B Natriuretic Peptide 1,396.1 (*)    All other components within normal limits  TROPONIN I (HIGH SENSITIVITY) - Abnormal; Notable for the following components:   Troponin I (High Sensitivity) 20 (*)    All other components within normal limits   ____________________________________________  EKG EKG read interpreted by me shows sinus tachycardia rate of 101 left axis nonspecific ST-T wave changes decreased R wave progression  ____________________________________________  RADIOLOGY Jill Poling, personally viewed and evaluated these images (plain radiographs) as part of my medical decision making, as well as reviewing the written report by the radiologist.  X-ray read by radiology and reviewed by me shows CHF with increasing vascular congestion compared to her EKG chest x-ray from last visit.  Official radiology report(s): DG Chest 2 View  Result Date: 10/26/2020 CLINICAL DATA:  Lower extremity edema for several days, shortness of breath EXAM: CHEST - 2 VIEW COMPARISON:  09/26/2020 FINDINGS: Frontal and lateral views of the chest demonstrates stable AICD. Cardiac silhouette is enlarged. Slight increase in central vascular congestion, with interval development of small bilateral pleural effusions. No acute airspace disease. No pneumothorax. IMPRESSION: 1. Mild congestive heart failure, with increased central vascular congestion and small bilateral effusions. Electronically Signed   By: Sharlet Salina M.D.   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  He then went to [facility] and was admitted to the hospital therefore group causing congestive failure.  He had a implantable cardiac defibrillator placed.  He left the hospital was discharges as before the pharmacy reopened so did not get his medicines.  He has been living partly with his sister.  Partly he was living somewhere else had a fight with his landlord and was not able to get any of his medicines including some experimental medicine that he was taking which I cannot find in the old records.  Patient basically has not had his metoprolol or Lasix since having his defibrillator placed.  He showed me his [facility] card which shows he is not service-connected and which expired last year.  Patient does have congestive failure.  I do not believe it safe to let him go in the current circumstances as he will just continue  to worsen.  He does agree to stay in the hospital.             ____________________________________________   FINAL CLINICAL IMPRESSION(S) / ED DIAGNOSES  Final diagnoses:  Acute on chronic congestive heart failure, unspecified heart failure type [facility])     ED Discharge Orders    None      *Please note:  Travis Lucas was evaluated in Emergency Department on 10/26/2020 for the symptoms described in the history of present illness. He was evaluated in the context of the global COVID-19 pandemic, which necessitated consideration that the patient might be at risk for infection with the SARS-CoV-2 virus that causes COVID-19. Institutional protocols and algorithms that pertain to the evaluation of patients at risk for COVID-19 are in a state of rapid change based on information released by regulatory bodies including the CDC and federal and state organizations. These policies and algorithms were followed  during the patient's care in the ED.  Some ED evaluations and interventions may be delayed as a result of limited staffing during and the pandemic.*   Note:  This document was prepared using Dragon voice recognition software and may include unintentional dictation errors.    Arnaldo Natal, MD 10/26/20 2236

## 2020-10-26 NOTE — ED Triage Notes (Signed)
PT to ED with cc of bilat lower extremity swelling for a few days. PT states cannot feel his feet, this is not new states it happens sometimes. It has been about a month since he's been able to take his Lasix or metoprolol. PT endorses being more shob than normal.

## 2020-10-26 NOTE — ED Notes (Signed)
Pt states that his legs started swelling a couple weeks ago bilaterally and that he began feeling shob. Denies cp and fevers. No hx of CHF.

## 2020-10-26 NOTE — H&P (Addendum)
History and Physical    [name] OHY:[id number] DOB: 02-28-1971 DOA: 10/26/2020  PCP: Patient, No Pcp Per   Patient coming from: Home   Chief Complaint: Leg swelling, SOB   HPI: Travis Lucas is a 49 y.o. male with medical history significant for nonischemic cardiomyopathy with AICD, insulin-dependent diabetes mellitus, BOOP, and chronic back pain, now presenting to emergency department for evaluation of lower extremity edema and shortness of breath.  Patient reports that he has been out of his medications for about a month and has since developed worsening leg swelling bilaterally, orthopnea, and shortness of breath.  He denies any chest pain, fevers, chills, or productive cough. He reports undergoing cardiac catheterization recently at an outside hospital and was told the "arteries are clean."  ED Course: Upon arrival to the ED, patient is found to be afebrile, saturating mid 90s on room air, heart rate of 100, and with diastolic blood pressure greater than 100.  EKG features sinus rhythm with incomplete RBBB.  Chest x-ray is notable for vascular congestion and small bilateral pleural effusions.  Chemistry panel features a glucose of 364.  High-sensitivity troponin slightly elevated.  BNP elevated to 1396.  Patient was given IV Lasix in the ED.  Review of Systems:  All other systems reviewed and apart from HPI, are negative.  Past Medical History:  Diagnosis Date  . BOOP (bronchiolitis obliterans with organizing pneumonia) (HCC)   . CHF (congestive heart failure) (HCC)   . Chronic back pain   . Diabetes mellitus without complication [facility])     Past Surgical History:  Procedure Laterality Date  . CARDIAC DEFIBRILLATOR PLACEMENT  09/10/2019  . CHOLECYSTECTOMY      Social History:   reports that he has been smoking. He has never used smokeless tobacco. He reports current alcohol use. He reports current drug use. Drug: Marijuana.  Allergies  Allergen Reactions  . Penicillins      History reviewed. No pertinent family history.   Prior to Admission medications   Medication Sig Start Date End Date Taking? Authorizing Provider  insulin aspart (NOVOLOG) 100 UNIT/ML injection Inject into the skin 2 (two) times daily with a meal. Inject 40 units before breakfast and 20 units before dinner.   Yes [provider]  insulin regular (NOVOLIN R) 100 units/mL injection Inject into the skin as needed for high blood sugar. Sliding scale.   Yes [provider]  naproxen sodium (ALEVE) 220 MG tablet Take 220 mg by mouth as needed.   Yes [provider]    Physical Exam: Vitals:   10/26/20 1541 10/26/20 1545 10/26/20 2137 10/26/20 2300  BP: (!) 129/92  (!) 133/103 (!) 155/119  Pulse: (!) 102  100 97  Resp: 18  18 19   Temp: 98.9 F (37.2 C)     TempSrc: Oral     SpO2: 96%  95% 98%  Weight:  81.6 kg    Height:  6\' 3"  (1.905 m)      Constitutional: NAD, calm  Eyes: PERTLA, lids and conjunctivae normal ENMT: Mucous membranes are moist. Posterior pharynx clear of any exudate or lesions.   Neck: normal, supple, no masses, no thyromegaly Respiratory: clear to auscultation bilaterally, no wheezing, no crackles. No accessory muscle use.  Cardiovascular: S1 & S2 heard, regular rate and rhythm. Pretibial pitting edema bilaterally. Neck veins distended.  Abdomen: No distension, no tenderness, soft. Bowel sounds active.  Musculoskeletal: no clubbing / cyanosis. No joint deformity upper and lower extremities.   Skin: no  significant rashes, lesions, ulcers. Warm, dry, well-perfused. Neurologic: CN 2-12 grossly intact. Sensation to light touch diminished in feet b/l. Strength 5/5 in all 4 limbs.  Psychiatric: Alert and oriented to person, place, and situation. Calm and cooperative.    Labs and Imaging on Admission: I have personally reviewed following labs and imaging studies  CBC: Recent Labs  Lab 10/26/20 1600  WBC 5.8  HGB 11.2*  HCT 35.9*  MCV  89.3  PLT 227   Basic Metabolic Panel: Recent Labs  Lab 10/26/20 1600  NA 136  K 4.3  CL 105  CO2 24  GLUCOSE 364*  BUN 22*  CREATININE 0.96  CALCIUM 7.7*   GFR: Estimated Creatinine Clearance: 108.6 mL/min (by C-G formula based on SCr of 0.96 mg/dL). Liver Function Tests: No results for input(s): AST, ALT, ALKPHOS, BILITOT, PROT, ALBUMIN in the last 168 hours. No results for input(s): LIPASE, AMYLASE in the last 168 hours. No results for input(s): AMMONIA in the last 168 hours. Coagulation Profile: No results for input(s): INR, PROTIME in the last 168 hours. Cardiac Enzymes: No results for input(s): CKTOTAL, CKMB, CKMBINDEX, TROPONINI in the last 168 hours. BNP (last 3 results) No results for input(s): PROBNP in the last 8760 hours. HbA1C: No results for input(s): HGBA1C in the last 72 hours. CBG: No results for input(s): GLUCAP in the last 168 hours. Lipid Profile: No results for input(s): CHOL, HDL, LDLCALC, TRIG, CHOLHDL, LDLDIRECT in the last 72 hours. Thyroid Function Tests: No results for input(s): TSH, T4TOTAL, FREET4, T3FREE, THYROIDAB in the last 72 hours. Anemia Panel: No results for input(s): VITAMINB12, FOLATE, FERRITIN, TIBC, IRON, RETICCTPCT in the last 72 hours. Urine analysis:    Component Value Date/Time   COLORURINE AMBER (A) 09/21/2020 1834   APPEARANCEUR HAZY (A) 09/21/2020 1834   LABSPEC 1.031 (H) 09/21/2020 1834   PHURINE 5.0 09/21/2020 1834   GLUCOSEU NEGATIVE 09/21/2020 1834   HGBUR SMALL (A) 09/21/2020 1834   BILIRUBINUR NEGATIVE 09/21/2020 1834   KETONESUR NEGATIVE 09/21/2020 1834   PROTEINUR >=300 (A) 09/21/2020 1834   NITRITE NEGATIVE 09/21/2020 1834   LEUKOCYTESUR NEGATIVE 09/21/2020 1834   Sepsis Labs: @LABRCNTIP (procalcitonin:4,lacticidven:4) )No results found for this or any previous visit (from the past 240 hour(s)).   Radiological Exams on Admission: DG Chest 2 View  Result Date: 10/26/2020 CLINICAL DATA:  Lower extremity  edema for several days, shortness of breath EXAM: CHEST - 2 VIEW COMPARISON:  09/26/2020 FINDINGS: Frontal and lateral views of the chest demonstrates stable AICD. Cardiac silhouette is enlarged. Slight increase in central vascular congestion, with interval development of small bilateral pleural effusions. No acute airspace disease. No pneumothorax. IMPRESSION: 1. Mild congestive heart failure, with increased central vascular congestion and small bilateral effusions. Electronically Signed   By: 09/28/2020 M.D.   On: 10/26/2020 16:33    EKG: Independently reviewed. Sinus rhythm, incomplete RBBB.   Assessment/Plan   1. Acute on chronic CHF  - Patient reports hx of NICM with AICD, has not been taking his medications for ~1 month, presents with progressive leg swelling, orthopnea, and SOB, and is found to have CHF findings on CXR and BNP 1396  - Diurese with Lasix 40 mg IV q12h, resume Toprol, check echocardiogram    2. Insulin-dependent DM  - Serum glucose 364 in ED  - Check CBGs, start with low-intensity SSI    3. BOOP  - Patient reports hx of BOOP, medical records not currently available  - Appears to be stable for outpatient  follow-up, TOC consulted for help establishing with local physician    4. Hypertensive urgency  - DBP >100 in ED in setting of hypervolemia and medication non-compliance  - Anticipate improvement with diuresis and resumption of metoprolol     DVT prophylaxis: Lovenox  Code Status: Full  Family Communication: Discussed with patient  Disposition Plan:  Patient is from: Home  Anticipated d/c is to: Home  Anticipated d/c date is: Possibly as early as 10/27/20 Patient currently: Pending improvement in SOB with diuresis, echocardiogram  Consults called: None  Admission status: Observation     Briscoe Deutscher, MD [facility]  10/26/2020, 11:45 PM

## 2020-10-27 ENCOUNTER — Observation Stay (HOSPITAL_COMMUNITY)
Admit: 2020-10-27 | Discharge: 2020-10-27 | Disposition: A | Discharge status: Home / Self Care | Payer: Self-pay | Attending: Physician Assistant | Admitting: Physician Assistant

## 2020-10-27 DIAGNOSIS — I10 Essential (primary) hypertension: Secondary | ICD-10-CM

## 2020-10-27 DIAGNOSIS — I5021 Acute systolic (congestive) heart failure: Secondary | ICD-10-CM

## 2020-10-27 DIAGNOSIS — Z720 Tobacco use: Secondary | ICD-10-CM

## 2020-10-27 DIAGNOSIS — I5023 Acute on chronic systolic (congestive) heart failure: Secondary | ICD-10-CM

## 2020-10-27 DIAGNOSIS — E785 Hyperlipidemia, unspecified: Secondary | ICD-10-CM

## 2020-10-27 LAB — RESP PANEL BY RT-PCR (FLU A&B, COVID) ARPGX2
Influenza A by PCR: NEGATIVE
Influenza B by PCR: NEGATIVE
SARS Coronavirus 2 by RT PCR: NEGATIVE

## 2020-10-27 LAB — ECHOCARDIOGRAM COMPLETE
AR max vel: 2.36 cm2
AV Area VTI: 2.06 cm2
AV Area mean vel: 2.17 cm2
AV Mean grad: 2 mmHg
AV Peak grad: 3.1 mmHg
Ao pk vel: 0.89 m/s
Area-P 1/2: 5.58 cm2
Height: 75 in
S' Lateral: 5.67 cm
Weight: 2880 oz

## 2020-10-27 LAB — BASIC METABOLIC PANEL
Anion gap: 11 (ref 5–15)
BUN: 20 mg/dL (ref 6–20)
CO2: 24 mmol/L (ref 22–32)
Calcium: 8.2 mg/dL — ABNORMAL LOW (ref 8.9–10.3)
Chloride: 104 mmol/L (ref 98–111)
Creatinine, Ser: 0.95 mg/dL (ref 0.61–1.24)
GFR, Estimated: >60 mL/min (ref >60)
Glucose, Bld: 289 mg/dL — ABNORMAL HIGH (ref 70–99)
Potassium: 3.7 mmol/L (ref 3.5–5.1)
Sodium: 139 mmol/L (ref 135–145)

## 2020-10-27 LAB — GLUCOSE, CAPILLARY
Glucose-Capillary: 296 mg/dL — ABNORMAL HIGH (ref 70–99)
Glucose-Capillary: 207 mg/dL — ABNORMAL HIGH (ref 70–99)

## 2020-10-27 LAB — HIV ANTIBODY (ROUTINE TESTING W REFLEX): HIV Screen 4th Generation wRfx: NONREACTIVE

## 2020-10-27 LAB — CBG MONITORING, ED
Glucose-Capillary: 367 mg/dL — ABNORMAL HIGH (ref 70–99)
Glucose-Capillary: 295 mg/dL — ABNORMAL HIGH (ref 70–99)
Glucose-Capillary: 258 mg/dL — ABNORMAL HIGH (ref 70–99)

## 2020-10-27 LAB — HEMOGLOBIN A1C
Hgb A1c MFr Bld: 11.9 % — ABNORMAL HIGH (ref 4.8–5.6)
Mean Plasma Glucose: 294.83 mg/dL

## 2020-10-27 LAB — TSH: TSH: 2.707 u[IU]/mL (ref 0.350–4.500)

## 2020-10-27 MED ORDER — ATORVASTATIN CALCIUM 10 MG PO TABS
10.0000 mg | ORAL_TABLET | Freq: Every day | ORAL | Status: DC
  Administered 2020-10-27: 10 mg via ORAL
  Filled 2020-10-27: qty 1

## 2020-10-27 MED ORDER — ONDANSETRON HCL 4 MG/2ML IJ SOLN: 4.0000 mg | Freq: Four times a day (QID) | INTRAMUSCULAR | Status: DC | PRN

## 2020-10-27 MED ORDER — INSULIN ASPART 100 UNIT/ML ~~LOC~~ SOLN
0.0000 [IU] | Freq: Every day | SUBCUTANEOUS | Status: DC
  Administered 2020-10-27: 5 [IU] via SUBCUTANEOUS
  Filled 2020-10-27: qty 1

## 2020-10-27 MED ORDER — INSULIN ASPART 100 UNIT/ML ~~LOC~~ SOLN
0.0000 [IU] | Freq: Every day | SUBCUTANEOUS | Status: DC
  Administered 2020-10-27 – 2020-11-01 (×4): 2 [IU] via SUBCUTANEOUS
  Filled 2020-10-27 (×4): qty 1

## 2020-10-27 MED ORDER — SODIUM CHLORIDE 0.9% FLUSH
3.0000 mL | Freq: Two times a day (BID) | INTRAVENOUS | Status: DC
  Administered 2020-10-27 – 2020-11-01 (×12): 3 mL via INTRAVENOUS

## 2020-10-27 MED ORDER — ACETAMINOPHEN 325 MG PO TABS: 650.0000 mg | ORAL_TABLET | ORAL | Status: DC | PRN

## 2020-10-27 MED ORDER — INSULIN ASPART 100 UNIT/ML ~~LOC~~ SOLN
0.0000 [IU] | Freq: Three times a day (TID) | SUBCUTANEOUS | Status: DC
  Administered 2020-10-27: 8 [IU] via SUBCUTANEOUS
  Administered 2020-10-28: 3 [IU] via SUBCUTANEOUS
  Administered 2020-10-28: 11 [IU] via SUBCUTANEOUS
  Administered 2020-10-29 (×2): 3 [IU] via SUBCUTANEOUS
  Administered 2020-10-29: 5 [IU] via SUBCUTANEOUS
  Administered 2020-10-30: 8 [IU] via SUBCUTANEOUS
  Administered 2020-10-30 (×2): 3 [IU] via SUBCUTANEOUS
  Administered 2020-10-31 (×2): 5 [IU] via SUBCUTANEOUS
  Administered 2020-10-31 – 2020-11-01 (×2): 3 [IU] via SUBCUTANEOUS
  Administered 2020-11-01: 13:00:00 5 [IU] via SUBCUTANEOUS
  Administered 2020-11-01: 17:00:00 3 [IU] via SUBCUTANEOUS
  Administered 2020-11-02: 15 [IU] via SUBCUTANEOUS
  Filled 2020-10-27 (×17): qty 1

## 2020-10-27 MED ORDER — ENOXAPARIN SODIUM 40 MG/0.4ML ~~LOC~~ SOLN
40.0000 mg | SUBCUTANEOUS | Status: DC
  Administered 2020-10-28: 40 mg via SUBCUTANEOUS
  Filled 2020-10-27: qty 0.4

## 2020-10-27 MED ORDER — INSULIN GLARGINE 100 UNIT/ML ~~LOC~~ SOLN
16.0000 [IU] | Freq: Every day | SUBCUTANEOUS | Status: DC
  Administered 2020-10-27 – 2020-10-29 (×3): 16 [IU] via SUBCUTANEOUS
  Filled 2020-10-27 (×3): qty 0.16

## 2020-10-27 MED ORDER — SODIUM CHLORIDE 0.9 % IV SOLN: 250.0000 mL | INTRAVENOUS | Status: DC | PRN

## 2020-10-27 MED ORDER — INSULIN ASPART 100 UNIT/ML ~~LOC~~ SOLN
0.0000 [IU] | Freq: Three times a day (TID) | SUBCUTANEOUS | Status: DC
  Administered 2020-10-27 (×2): 5 [IU] via SUBCUTANEOUS
  Filled 2020-10-27 (×2): qty 1

## 2020-10-27 MED ORDER — SODIUM CHLORIDE 0.9% FLUSH: 3.0000 mL | INTRAVENOUS | Status: DC | PRN

## 2020-10-27 MED ORDER — LIVING WELL WITH DIABETES BOOK
Freq: Once | Status: DC
  Filled 2020-10-27: qty 1

## 2020-10-27 MED ORDER — FUROSEMIDE 10 MG/ML IJ SOLN
40.0000 mg | Freq: Once | INTRAMUSCULAR | Status: AC
  Administered 2020-10-27: 40 mg via INTRAVENOUS
  Filled 2020-10-27: qty 4

## 2020-10-27 NOTE — Progress Notes (Signed)
*  PRELIMINARY RESULTS* Echocardiogram 2D Echocardiogram has been performed.  Travis Lucas 10/27/2020, 12:53 PM

## 2020-10-27 NOTE — Progress Notes (Addendum)
PROGRESS NOTE   Corrie Brannen  OIZ:[id number]    DOB: [birth year]    DOA: 10/26/2020  PCP: Patient, No Pcp Per   I have briefly reviewed patients previous medical records in [facility].  Chief Complaint  Patient presents with  . Leg Swelling    Brief Narrative:  49 year old male, homeless, recently moved to [place] from [place] on October 02, 2020, currently residing with his sister and plans to remain in [place], past medical history significant for nonischemic cardiomyopathy, chronic systolic CHF, AICD placed 09/20/2019, type II DM/IDDM, hypertension, hyperlipidemia, CAD, COOP, ongoing tobacco use disorder, THC use disorder, depression, GERD, gastric ulcer, MRSA bacteremia?  Completed antibiotic course, previously on home oxygen but currently not, presented to the ED on 10/26/2020 due to marked lower extremity edema, dyspnea and orthopnea in the context of noncompliance with his medications for more than a month.  He reportedly underwent cardiac cath recently at outside hospital and was told that his "arteries were clean".  Admitted for acute on chronic systolic CHF.  Cardiology was consulted for assistance with   Assessment & Plan:  Principal Problem:   Acute on chronic systolic CHF (congestive heart failure) (HCC) Active Problems:   Insulin-requiring or dependent type II diabetes mellitus (HCC)   BOOP (bronchiolitis obliterans with organizing pneumonia) (HCC)   Hypertensive urgency   Acute on chronic systolic CHF/nonischemic cardiomyopathy/s/p AICD: CHF precipitated by prolonged noncompliance to medications due to social issues.  Started on Lasix 40 mg IV twice daily, remains significantly volume overloaded, continue.  Cardiology consulted, follow 2D echo that has been ordered, strict intake output and daily weights, Toprol-XL 25 mg daily initiated and recommend starting PTA losartan and spironolactone as BP permits-after patient is euvolemic.  (As per my review of  out of facility patient's medical records, PTA patient was on aspirin 81 mg daily, Lasix 20 mg daily, Imdur 30 mg daily, Toprol-XL 100 mg daily, Aldactone 25 mg daily, Lipitor 10 mg daily, Cozaar 25 mg daily).  TOC consulted to assist with finding PCP locally and medications upon discharge.  On admission, TSH: 2.707, BNP: 1396.1, HS troponin: 20, chest x-ray suggestive of pulmonary edema.  Essential hypertension/hypertensive urgency: Currently uncontrolled as below.  Initiated Toprol XL 25 mg daily.  Also on IV Lasix for diuresis which should help.  Continue as needed labetalol.  Titrate up medications as tolerated and consider gradually adding other meds as noted above.  Hyperlipidemia: Resume Lipitor 10 mg daily.  Type II DM/IDDM: Diabetes coordinator input appreciated.  As per recommendations, started Lantus 16 units daily, continue SSI and adjust insulins as needed.  Follow A1c.  Polysubstance abuse (tobacco, THC) Cessation counseled.  Anemia, suspect of chronic disease Follow CBC.  Cryptogenic organizing pneumonia: Recommend outpatient pulmonology consultation upon discharge.  History of MRSA bacteremia Patient reports completing treatment, partly in [place] and subsequently in [place].  Homeless TOC consulted for assistance  Body mass index is 22.5 kg/m.    DVT prophylaxis: enoxaparin (LOVENOX) injection 40 mg Start: 10/27/20 0132     Code Status: Full Code Family Communication: None at bedside. Disposition:  Status is: Observation  The patient will require care spanning > 2 midnights and should be moved to inpatient because: IV treatments appropriate due to intensity of illness or inability to take PO  Dispo: The patient is from: Home              Anticipated d/c is to: Home  Anticipated d/c date is: 3 days              Patient currently is not medically stable to d/c.        Consultants:   Cardiology  Procedures:    None  Antimicrobials:    Anti-infectives (From admission, onward)   None        Subjective:  Seen this morning while still in the ED.  Urinating well.  Feels somewhat better.  Leg swelling/pain is better but still has significant leg swellings going up to his thighs.  Reports ongoing dyspnea.  No chest pain.  I briefly reviewed hard copies of medical records faxed from outside facility.  Objective:   Vitals:   10/27/20 1123 10/27/20 1200 10/27/20 1230 10/27/20 1253  BP: (!) 139/106   (!) 132/103  Pulse: 88 91 90 81  Resp: 16   16  Temp:      TempSrc:      SpO2: 99% 100% 99% 97%  Weight:      Height:        General exam: Pleasant young male, moderately built and nourished sitting up comfortably in bed without distress. Respiratory system: Few fine bibasilar crackles.  Rest of lung fields clear to auscultation without wheezing or rhonchi.  No increased work of breathing. Cardiovascular system: S1 & S2 heard, RRR. No JVD, murmurs, rubs, gallops or clicks.  3+ pitting bilateral leg edema extending up to mid thigh at least. Gastrointestinal system: Abdomen is nondistended, soft and nontender. No organomegaly or masses felt. Normal bowel sounds heard. Central nervous system: Alert and oriented. No focal neurological deficits. Extremities: Symmetric 5 x 5 power. Skin: No rashes, lesions or ulcers Psychiatry: Judgement and insight appear normal. Mood & affect appropriate.     Data Reviewed:   I have personally reviewed following labs and imaging studies   CBC: Recent Labs  Lab 10/26/20 1600  WBC 5.8  HGB 11.2*  HCT 35.9*  MCV 89.3  PLT 227    Basic Metabolic Panel: Recent Labs  Lab 10/26/20 1600 10/27/20 0420  NA 136 139  K 4.3 3.7  CL 105 104  CO2 24 24  GLUCOSE 364* 289*  BUN 22* 20  CREATININE 0.96 0.95  CALCIUM 7.7* 8.2*    Liver Function Tests: No results for input(s): AST, ALT, ALKPHOS, BILITOT, PROT, ALBUMIN in the last 168  hours.  CBG: Recent Labs  Lab 10/27/20 0152 10/27/20 0828 10/27/20 1327  GLUCAP 367* 295* 258*    Microbiology Studies:   Recent Results (from the past 240 hour(s))  Resp Panel by RT-PCR (Flu A&B, Covid) Nasopharyngeal Swab     Status: None   Collection Time: 10/26/20 11:48 PM   Specimen: Nasopharyngeal Swab; Nasopharyngeal(NP) swabs in vial transport medium  Result Value Ref Range Status   SARS Coronavirus 2 by RT PCR NEGATIVE NEGATIVE Final    Comment: (NOTE) SARS-CoV-2 target nucleic acids are NOT DETECTED.  The SARS-CoV-2 RNA is generally detectable in upper respiratory specimens during the acute phase of infection. The lowest concentration of SARS-CoV-2 viral copies this assay can detect is 138 copies/mL. A negative result does not preclude SARS-Cov-2 infection and should not be used as the sole basis for treatment or other patient management decisions. A negative result may occur with  improper specimen collection/handling, submission of specimen other than nasopharyngeal swab, presence of viral mutation(s) within the areas targeted by this assay, and inadequate number of viral copies(<138 copies/mL). A negative result must be combined  with clinical observations, patient history, and epidemiological information. The expected result is Negative.  Fact Sheet for Patients:  BloggerCourse.com  Fact Sheet for Healthcare Providers:  SeriousBroker.it  This test is no t yet approved or cleared by the [place] FDA and  has been authorized for detection and/or diagnosis of SARS-CoV-2 by FDA under an Emergency Use Authorization (EUA). This EUA will remain  in effect (meaning this test can be used) for the duration of the COVID-19 declaration under Section 564(b)(1) of the Act, 21 U.S.C.section 360bbb-3(b)(1), unless the authorization is terminated  or revoked sooner.       Influenza A by PCR NEGATIVE NEGATIVE Final    Influenza B by PCR NEGATIVE NEGATIVE Final    Comment: (NOTE) The Xpert Xpress SARS-CoV-2/FLU/RSV plus assay is intended as an aid in the diagnosis of influenza from Nasopharyngeal swab specimens and should not be used as a sole basis for treatment. Nasal washings and aspirates are unacceptable for Xpert Xpress SARS-CoV-2/FLU/RSV testing.  Fact Sheet for Patients: BloggerCourse.com  Fact Sheet for Healthcare Providers: SeriousBroker.it  This test is not yet approved or cleared by the [place] FDA and has been authorized for detection and/or diagnosis of SARS-CoV-2 by FDA under an Emergency Use Authorization (EUA). This EUA will remain in effect (meaning this test can be used) for the duration of the COVID-19 declaration under Section 564(b)(1) of the Act, 21 U.S.C. section 360bbb-3(b)(1), unless the authorization is terminated or revoked.  Performed at [facility], [address], [place], [place] [address]      Radiology Studies:  DG Chest 2 View  Result Date: 10/26/2020 CLINICAL DATA:  Lower extremity edema for several days, shortness of breath EXAM: CHEST - 2 VIEW COMPARISON:  09/26/2020 FINDINGS: Frontal and lateral views of the chest demonstrates stable AICD. Cardiac silhouette is enlarged. Slight increase in central vascular congestion, with interval development of small bilateral pleural effusions. No acute airspace disease. No pneumothorax. IMPRESSION: 1. Mild congestive heart failure, with increased central vascular congestion and small bilateral effusions. Electronically Signed   By: Sharlet Salina M.D.   On: 10/26/2020 16:33     Scheduled Meds:   . enoxaparin (LOVENOX) injection  40 mg Subcutaneous Q24H  . furosemide  40 mg Intravenous BID  . insulin aspart  0-5 Units Subcutaneous QHS  . insulin aspart  0-9 Units Subcutaneous TID WC  . living well with diabetes book   Does not apply Once  .  metoprolol succinate  25 mg Oral Daily  . sodium chloride flush  3 mL Intravenous Q12H    Continuous Infusions:   . sodium chloride       LOS: 0 days     Marcellus Scott, MD, [place], [facility]. [facility]    To contact the attending provider between 7A-7P or the covering provider during after hours 7P-7A, please log into the web site [email or web address] and access using universal [place] password for that web site. If you do not have the password, please call the hospital operator.  10/27/2020, 2:08 PM

## 2020-10-27 NOTE — Plan of Care (Signed)
?  Problem: Education: ?Goal: Ability to verbalize understanding of medication therapies will improve ?Outcome: Progressing ?  ?Problem: Activity: ?Goal: Capacity to carry out activities will improve ?Outcome: Progressing ?  ?Problem: Cardiac: ?Goal: Ability to achieve and maintain adequate cardiopulmonary perfusion will improve ?Outcome: Progressing ?  ?

## 2020-10-27 NOTE — ED Notes (Signed)
Floor RN updated on interventions.

## 2020-10-27 NOTE — ED Notes (Signed)
Lunch tray is not present, so will defer CBG check and insulin administration until lunch tray is present.

## 2020-10-27 NOTE — ED Notes (Signed)
Waiting for room to be cleaned.

## 2020-10-27 NOTE — ED Notes (Signed)
Patient given lunch tray.

## 2020-10-27 NOTE — Progress Notes (Signed)
Patient admitted to unit approximately at this time by ED nurse. Chart report reviewed by Clinical research associate. Patient ambulatory upon initial assessment, A/Ox4 and able to verbalize needs. No SOB/CP verbalized. SR/ST on tele with history of placement of an AICD device verified with monitor tech. BP remains elevated. MD aware. Patient oriented to room and room accessories. Requested shower upon admission to floor. Independent in room. Call bell within reach. Will continue to monitor.

## 2020-10-27 NOTE — ED Notes (Signed)
Echo tech at bedside.

## 2020-10-27 NOTE — Consult Note (Signed)
Cardiology Consultation:   Patient ID: Travis Lucas MRN: [id number]; DOB: 29-Oct-1971  Admit date: 10/26/2020 Date of Consult: 10/27/2020  Primary Care Provider: Patient, No Pcp Per [facility] Cardiologist:Dr. Agbor-Etang [facility] Electrophysiologist:  None    Patient Profile:   Travis Lucas is a 49 y.o. male with a hx of systolic heart failure (EF currently unknown on review of copied [place] records), s/p AICD (per records, [facility] 2020), CAD, ICM, hypertension, hyperlipidemia, DM2, GERD, gastric ulcer, migraines, pancreatitis, vertigo, chronic constipation, recent 08/2020 MRSA positive status / bacteremia, current alcohol/tobacco/marijuana use, and who is being seen today for the evaluation of acute on chronic heart failure at the request of Dr. Al Corpus.  History of Present Illness:   Travis Lucas is a 49 year old male that recently moved here from [place], [place], where he was homeless, and in order to be with his sisters.  The following PMH was obtained via copied medical records from [place] and patient report.  Of note, copied medical records do not have any specific ejection fraction, echo, or cardiac catheterization/device implantation records included.  He reports a recent hospitalization in [place], during which time he was told that he had reduced LV function.  Since that time, he reports staying away from fluids but continuing to eat salt.  He denies discharge with any cardiac medications at that time.     He denies any family history of heart disease, including sudden cardiac death.  Past surgical history listed on medical record shows cardiac defibrillator placement 09/20/2019 with dual-chamber ICD by Dr. Tobie Poet.   He currently smokes 1 cigarette/day, reduced from 10 cigarettes daily.  He reports drinking 1 beer daily.  He notes daily marijuana use.  He moved here 1 month ago, after his recent hospitalization in [place] for systolic heart failure.  On  review of paper records not yet scanned into his record from the [place] emergency department, he presented 09/01/2020 with chest pain and shortness of breath, as well as nausea and emesis.  He reported dizziness x1 day with EMS reporting blood glucose 444.  It was noted that he followed regularly with a cardiologist but had been lost to follow-up for over 1 year and homeless.  He had not been taking his Lasix as directed.  Lactic acid 2.2, Tn 0.03, BNP 1786.  He received IV insulin. ID found him positive for MRSA.  He was discharged on ASA 81 mg, Dalvance 500 mg, furosemide 20 mg daily, Imdur 30 mg daily, Toprol-XL 100 mg daily, atorvastatin 10 mg daily, and losartan 25 mg daily along with his other medications and IV abx.  Since that time, he reports progressive shortness of breath, dyspnea, and increasing lower extremity edema.  At times, he reports racing heart rate and dizziness with hospital records showing previous diagnosis of vertigo.  He denies chest pain, palpitations, pnd, orthopnea, n, v, syncope, or early satiety.  He reportedly decided to present to [facility] emergency department.  In the ED, initial vitals stable.  Labs significant for stable renal function and electrolytes.  BNP 1396.1, high-sensitivity troponin 20, hemoglobin 11.2 hematocrit 35.9, respiratory panel negative.Chest x-ray showed mild congestive heart failure with increase central vascular congestion and small bilateral effusions.   Past Medical History:  Diagnosis Date   BOOP (bronchiolitis obliterans with organizing pneumonia) (HCC)    CHF (congestive heart failure) (HCC)    Chronic back pain    Diabetes mellitus without complication [facility])     Past Surgical History:  Procedure Laterality Date  CARDIAC DEFIBRILLATOR PLACEMENT  09/10/2019   CHOLECYSTECTOMY       Home Medications:  Prior to Admission medications   Medication Sig Start Date End Date Taking? Authorizing Provider  insulin aspart (NOVOLOG) 100 UNIT/ML  injection Inject into the skin 2 (two) times daily with a meal. Inject 40 units before breakfast and 20 units before dinner.   Yes [provider]  insulin regular (NOVOLIN R) 100 units/mL injection Inject into the skin as needed for high blood sugar. Sliding scale.   Yes [provider]  naproxen sodium (ALEVE) 220 MG tablet Take 220 mg by mouth as needed.   Yes [provider]    Inpatient Medications: Scheduled Meds:  enoxaparin (LOVENOX) injection  40 mg Subcutaneous Q24H   furosemide  40 mg Intravenous BID   insulin aspart  0-5 Units Subcutaneous QHS   insulin aspart  0-9 Units Subcutaneous TID WC   metoprolol succinate  25 mg Oral Daily   sodium chloride flush  3 mL Intravenous Q12H   Continuous Infusions:  sodium chloride     PRN Meds: sodium chloride, acetaminophen, labetalol, ondansetron (ZOFRAN) IV, sodium chloride flush  Allergies:    Allergies  Allergen Reactions   Penicillins     Social History:   Social History   Socioeconomic History   Marital status: Single    Spouse name: Not on file   Number of children: Not on file   Years of education: Not on file   Highest education level: Not on file  Occupational History   Not on file  Tobacco Use   Smoking status: Current Every Day Smoker   Smokeless tobacco: Never Used   Tobacco comment: 1 cigarrette a day   Vaping Use   Vaping Use: Never used  Substance and Sexual Activity   Alcohol use: Yes    Comment: "socially"    Drug use: Yes    Types: Marijuana   Sexual activity: Not on file  Other Topics Concern   Not on file  Social History Narrative   Not on file   Social Determinants of Health   Financial Resource Strain:    Difficulty of Paying Living Expenses: Not on file  Food Insecurity:    Worried About Running Out of Food in the Last Year: Not on file   Ran Out of Food in the Last Year: Not on file  Transportation Needs:    Lack of  Transportation (Medical): Not on file   Lack of Transportation (Non-Medical): Not on file  Physical Activity:    Days of Exercise per Week: Not on file   Minutes of Exercise per Session: Not on file  Stress:    Feeling of Stress : Not on file  Social Connections:    Frequency of Communication with Friends and Family: Not on file   Frequency of Social Gatherings with Friends and Family: Not on file   Attends Religious Services: Not on file   Active Member of Clubs or Organizations: Not on file   Attends Banker Meetings: Not on file   Marital Status: Not on file  Intimate Partner Violence:    Fear of Current or Ex-Partner: Not on file   Emotionally Abused: Not on file   Physically Abused: Not on file   Sexually Abused: Not on file    Family History:   History reviewed. No pertinent family history.  He denies family history of CAD or heart failure. ROS:  Please see the history of present  illness.  Review of Systems  Respiratory: Positive for shortness of breath. Negative for cough and hemoptysis.   Cardiovascular: Positive for leg swelling. Negative for chest pain, palpitations and orthopnea.  Gastrointestinal: Negative for blood in stool and melena.  Genitourinary: Negative for hematuria.  Musculoskeletal: Negative for falls.  Neurological: Positive for dizziness. Negative for loss of consciousness.  Psychiatric/Behavioral: Positive for substance abuse.  All other systems reviewed and are negative.   All other ROS reviewed and negative.     Physical Exam/Data:   Vitals:   10/27/20 0700 10/27/20 0702 10/27/20 0730 10/27/20 0832  BP: (!) 123/99  (!) 133/101 114/88  Pulse: 91 89  87  Resp: 13 12  14   Temp:    98.4 F (36.9 C)  TempSrc:    Oral  SpO2: (!) 87% 96%  100%  Weight:      Height:        Intake/Output Summary (Last 24 hours) at 10/27/2020 1018 Last data filed at 10/27/2020 0935 Gross per 24 hour  Intake --  Output 2950 ml  Net  -2950 ml   Last 3 Weights 10/26/2020 09/26/2020 09/21/2020  Weight (lbs) 180 lb 185 lb 180 lb  Weight (kg) 81.647 kg 83.915 kg 81.647 kg     Body mass index is 22.5 kg/m.  General:  Well nourished, well developed, in no acute distress. Eating breakfast. HEENT: normal Lymph: no adenopathy Neck: JVP ~11-12cm Endocrine:  No thryomegaly Vascular: No carotid bruits; FA pulses 2+ bilaterally without bruits  Cardiac:  normal S1, S2; tachycardic; no murmur  Lungs: Right basilar crackles, reduced left basilar breath sounds   09/23/2020 distended, no hepatomegaly  Ext: 1-2+ bilateral lower extremity edema extending up into the thigh Musculoskeletal:  No deformities, BUE and BLE strength normal and equal Skin: warm and dry  Neuro:  CNs 2-12 intact, no focal abnormalities noted Psych:  Normal affect   EKG:  The EKG was personally reviewed and demonstrates: NSR, 99 bpm, left atrial enlargement, incomplete right bundle branch block with QRS 92 ms, poor R wave progression Telemetry:  Telemetry was personally reviewed and demonstrates: NSR to ST, 90s to 110  Relevant CV Studies: No CV studies from [id number] in copied records  Pending echo  Laboratory Data:  High Sensitivity Troponin:   Recent Labs  Lab 10/26/20 1600  TROPONINIHS 20*     Chemistry Recent Labs  Lab 10/26/20 1600 10/27/20 0420  NA 136 139  K 4.3 3.7  CL 105 104  CO2 24 24  GLUCOSE 364* 289*  BUN 22* 20  CREATININE 0.96 0.95  CALCIUM 7.7* 8.2*  GFRNONAA >60 >60  ANIONGAP 7 11    No results for input(s): PROT, ALBUMIN, AST, ALT, ALKPHOS, BILITOT in the last 168 hours. Hematology Recent Labs  Lab 10/26/20 1600  WBC 5.8  RBC 4.02*  HGB 11.2*  HCT 35.9*  MCV 89.3  MCH 27.9  MCHC 31.2  RDW 15.9*  PLT 227   BNP Recent Labs  Lab 10/26/20 1600  BNP 1,396.1*    DDimer No results for input(s): DDIMER in the last 168 hours.   Radiology/Studies:  DG Chest 2 View  Result Date: 10/26/2020 CLINICAL  DATA:  Lower extremity edema for several days, shortness of breath EXAM: CHEST - 2 VIEW COMPARISON:  09/26/2020 FINDINGS: Frontal and lateral views of the chest demonstrates stable AICD. Cardiac silhouette is enlarged. Slight increase in central vascular congestion, with interval development of small bilateral pleural effusions. No acute airspace disease. No  pneumothorax. IMPRESSION: 1. Mild congestive heart failure, with increased central vascular congestion and small bilateral effusions. Electronically Signed   By: Sharlet Salina M.D.   On: 10/26/2020 16:33     Assessment and Plan:   1. Acute on chronic systolic heart failure s/p AICD (EF unknown)  Volume up on exam.  Reports significant sx and DOE with minimal activity.  Continue IV diuresis with Lasix 40 mg twice daily.    He will need transition back to oral diuresis before discharge.  I's/O's.   Net -1.5L yesterday though intake untracked.  Standing weights.    Wt 81.6kg.  Echo pending with further recommendations at that time.  Hospital records from [place] do not indicate most recent EF.  Interrogate device in ED.   Restart home losartan 25 mg daily as renal function and BP allows. Escalate BB for rate control as tolerated by BP.  Current BP soft. Escalate once room.  Daily BMET.  Replete electrolytes as needed.  Reviewed CHF education.    Admits to heavy salt intake and does not enjoy his current heart healthy breakfast.    2. Hypokalemia  Replete with goal 4.0. Check Mg with goal 2.0.  3. CAD  No chest pain. Medical records indicate a history of CAD. S/p AICD. High-sensitivity troponin minimally elevated at 20.  EKG without acute ST/T changes.    Not consistent with ACS.   Suspect supply demand ischemia in the setting of volume overload.  No indication for IV heparin at this time. Continue Lovenox.  No plan for cardiac catheterization at this time.  We will check an echo with further recommendations at  that time and pending reassessment of EF /wall motion / structure.   Continue medical management. Restart home medications including ASA 81 mg daily, Toprol XL. Restart statin.  Recommend high intensity statin for risk factor modification. Escalate GDMT as tolerated.  4. Sinus tachycardia  Occasionally feels racing heart rate.  Notes occasional dizziness with standing diagnosis of vertigo.  Restart home BB and escalate as tolerated by BP with previous dose of Toprol 100 mg.  Checking TSH.  Maintain electrolytes at goal. Interrogate device in ED and monitor on telemetry.   5. HTN  Continue Toprol 25 mg daily.  Home medications included Toprol 100 mg daily, losartan 25 mg qd. As above, restart losartan losartan 25 mg daily as BP / Cr allows.  Escalate Toprol as tolerated by BP.  6. HLD  Restart statin.  6.   Remainder per IM.         New [place] Heart Association (NYHA) Functional Class NYHA Class III        For questions or updates, please contact [facility] Please consult [email or web address] for contact info under    Signed, Lennon Alstrom, PA-C  10/27/2020 10:18 AM

## 2020-10-27 NOTE — ED Notes (Signed)
Pt ambulatory to toilet by self.  

## 2020-10-27 NOTE — Progress Notes (Addendum)
Inpatient Diabetes Program Recommendations  AACE/ADA: New Consensus Statement on Inpatient Glycemic Control (2015)  Target Ranges:  Prepandial:   less than 140 mg/dL      Peak postprandial:   less than 180 mg/dL (1-2 hours)      Critically ill patients:  140 - 180 mg/dL   Lab Results  Component Value Date   GLUCAP 295 (H) 10/27/2020    Review of Glycemic Control Results for NHAT, HEARNE (MRN [id number]) as of 10/27/2020 10:47  Ref. Range 10/27/2020 01:52 10/27/2020 08:28  Glucose-Capillary Latest Ref Range: 70 - 99 mg/dL 376 (H) 283 (H)   Diabetes history: DM2 Outpatient Diabetes medications: Novolog 40 units ac breakfast + 20 units ac supper + Novolin R sliding scale Current orders for Inpatient glycemic control: Novolog correction 0-9 units tid + hs 0-5 units  Inpatient Diabetes Program Recommendations:   Consider: -Lantus 16 units now and then q day (0.2 units/kg x 81.6 kg.)  Noted patient does not have insurance listed. Novolin 70/30 insulin bid would be an option for patient if needs longer acting insulin. A1c pending. Secure chat sent to Dr. Waymon Amato.  Spoke with patient @ bedside to discuss insulin regimen which is atypical. Patient states he has been on basal in the past but unable to afford it. Pt. Has been getting his insulin from the health department and states basal and 70/30 insulin is not available from the health department. Consult to Transition of care to evaluate for medication needs and followup.  Thank you, Travis Lucas. Ennis Heavner, RN, MSN, CDE  Diabetes Coordinator Inpatient Glycemic Control Team Team Pager [phone] (8am-5pm) 10/27/2020 10:51 AM

## 2020-10-28 DIAGNOSIS — F17200 Nicotine dependence, unspecified, uncomplicated: Secondary | ICD-10-CM

## 2020-10-28 DIAGNOSIS — I10 Essential (primary) hypertension: Secondary | ICD-10-CM

## 2020-10-28 LAB — URINE DRUG SCREEN, QUALITATIVE (ARMC ONLY)
Amphetamines, Ur Screen: NOT DETECTED
Barbiturates, Ur Screen: NOT DETECTED
Benzodiazepine, Ur Scrn: NOT DETECTED
Cannabinoid 50 Ng, Ur ~~LOC~~: POSITIVE — AB
Cocaine Metabolite,Ur ~~LOC~~: NOT DETECTED
MDMA (Ecstasy)Ur Screen: NOT DETECTED
Methadone Scn, Ur: NOT DETECTED
Opiate, Ur Screen: NOT DETECTED
Phencyclidine (PCP) Ur S: NOT DETECTED

## 2020-10-28 LAB — BASIC METABOLIC PANEL
Anion gap: 12 (ref 5–15)
BUN: 18 mg/dL (ref 6–20)
CO2: 27 mmol/L (ref 22–32)
Calcium: 7.9 mg/dL — ABNORMAL LOW (ref 8.9–10.3)
Chloride: 103 mmol/L (ref 98–111)
Creatinine, Ser: 0.75 mg/dL (ref 0.61–1.24)
GFR, Estimated: >60 mL/min (ref >60)
Glucose, Bld: 161 mg/dL — ABNORMAL HIGH (ref 70–99)
Potassium: 3.2 mmol/L — ABNORMAL LOW (ref 3.5–5.1)
Sodium: 142 mmol/L (ref 135–145)

## 2020-10-28 LAB — HEPATIC FUNCTION PANEL
ALT: 45 U/L — ABNORMAL HIGH (ref 0–44)
AST: 40 U/L (ref 15–41)
Albumin: 2.2 g/dL — ABNORMAL LOW (ref 3.5–5.0)
Alkaline Phosphatase: 440 U/L — ABNORMAL HIGH (ref 38–126)
Bilirubin, Direct: 0.2 mg/dL (ref 0.0–0.2)
Indirect Bilirubin: 0.8 mg/dL (ref 0.3–0.9)
Total Bilirubin: 1 mg/dL (ref 0.3–1.2)
Total Protein: 5.7 g/dL — ABNORMAL LOW (ref 6.5–8.1)

## 2020-10-28 LAB — CBC
HCT: 35.1 % — ABNORMAL LOW (ref 39.0–52.0)
Hemoglobin: 11.3 g/dL — ABNORMAL LOW (ref 13.0–17.0)
MCH: 28.5 pg (ref 26.0–34.0)
MCHC: 32.2 g/dL (ref 30.0–36.0)
MCV: 88.4 fL (ref 80.0–100.0)
Platelets: 228 10*3/uL (ref 150–400)
RBC: 3.97 MIL/uL — ABNORMAL LOW (ref 4.22–5.81)
RDW: 15.9 % — ABNORMAL HIGH (ref 11.5–15.5)
WBC: 5.8 10*3/uL (ref 4.0–10.5)
nRBC: 0.3 % — ABNORMAL HIGH (ref 0.0–0.2)

## 2020-10-28 LAB — GLUCOSE, CAPILLARY
Glucose-Capillary: 370 mg/dL — ABNORMAL HIGH (ref 70–99)
Glucose-Capillary: 196 mg/dL — ABNORMAL HIGH (ref 70–99)
Glucose-Capillary: 81 mg/dL (ref 70–99)
Glucose-Capillary: 180 mg/dL — ABNORMAL HIGH (ref 70–99)

## 2020-10-28 MED ORDER — METOPROLOL SUCCINATE ER 50 MG PO TB24
50.0000 mg | ORAL_TABLET | Freq: Every day | ORAL | Status: DC
  Administered 2020-10-28 – 2020-11-02 (×6): 50 mg via ORAL
  Filled 2020-10-28 (×6): qty 1

## 2020-10-28 MED ORDER — POTASSIUM CHLORIDE CRYS ER 20 MEQ PO TBCR
40.0000 meq | EXTENDED_RELEASE_TABLET | Freq: Once | ORAL | Status: AC
  Administered 2020-10-28: 40 meq via ORAL
  Filled 2020-10-28: qty 2

## 2020-10-28 MED ORDER — ATORVASTATIN CALCIUM 20 MG PO TABS
80.0000 mg | ORAL_TABLET | Freq: Every day | ORAL | Status: DC
  Administered 2020-10-28 – 2020-11-02 (×6): 80 mg via ORAL
  Filled 2020-10-28 (×3): qty 1
  Filled 2020-10-28: qty 4
  Filled 2020-10-28: qty 1
  Filled 2020-10-28: qty 4

## 2020-10-28 MED ORDER — LOSARTAN POTASSIUM 25 MG PO TABS
25.0000 mg | ORAL_TABLET | Freq: Every day | ORAL | Status: DC
  Administered 2020-10-28 – 2020-11-02 (×6): 25 mg via ORAL
  Filled 2020-10-28 (×6): qty 1

## 2020-10-28 NOTE — Consult Note (Addendum)
   Heart failure nurse navigator note  HfrEF <20%  He presented with worsening lower extremity edema, dyspnea and orthopnea.  Co morbidities:  Diabetes type 2 Hypertension Hyperlipidemia Depression  Alcohol/TCH abuse He has ICD    Cardiac catheterization performed for complaints of chest pain, revealed no coronary artery disease.  Medications: Lipitor 80 mg daily Furosemide 80 mg IV 2 times a day Losartan 25 mg once daily Metoprolol succinate 50 mg once daily   Labs: Sodium 142, potassium 3.2, UN 18, creatinine 0.75, AST 40, ALT 45, hemoglobin 11.3 and hematocrit 35.1 Weight 85.2 kg (89) BMI 23.49 Blood pressure 138/102 Intake 480 mL Output 4220 mL   Assessment:  General he is awake and alert, no acute distress.  HEENT-pupils are equal, no JVD noted.  Cardiac-heart tones are regular rate and rhythm no rubs or murmur appreciated.  Chest-lungs are clear to posterior auscultation.  Abdomen-soft nontender.  Musculoskeletal-2+ edema to his thighs  Psych is pleasant and appropriate makes good eye contact.  Neurologic- speech  is clear, is all extremities without difficulty.  He has a productive cough of white sputum.  He also states when he bends over to tie his shoe he notices shortness of breath.  Denies any shortness of breath with ambulation around his room.  He notices  at home he can normally go 1 block before he notices having to stop due to shortness of breath.  He states prior to admission shorts that he had worn were loose and he had to tie them up for them to stay up, but just  before admission, they were no longer loose.  Discussed  heart failure taking care of himself to discharge from the hospital.  He states that if he does not have a scale that he will be able to get one.  Stressed the importance of weighing daily and recording.  What to report to physician.  He was made aware that the function of his left ventricle is less than 20%.  Stressed the  importance of sticking with low-sodium diet less than 2000 mg daily and less than 64 ounces in a 24-hour period also.  He states that he lives with his younger sister she is very supportive and he states that he does like foods that are higher in sodium but discussed eating more fresh fruits and vegetables which he said would be possible.  States that he viewed the heart failure videos yesterday and found that they were very informative but he has not read the heart failure booklet yet.  He also has his own zone  magnet.  He was also given the survining the holiday handout.  Also explained that he will be seen  in outpatient heart failure clinic, Clarisa Kindred NP. She will determine how how often for him to be seen.  Arielle RN, states that they had tried to do REDS clip reading but was unable to obtain reading.   Tresa Endo RN, CHFN

## 2020-10-28 NOTE — Progress Notes (Signed)
Inpatient Diabetes Program Recommendations  AACE/ADA: New Consensus Statement on Inpatient Glycemic Control   Target Ranges:  Prepandial:   less than 140 mg/dL      Peak postprandial:   less than 180 mg/dL (1-2 hours)      Critically ill patients:  140 - 180 mg/dL   Results for HARROL, NOVELLO (MRN [id number]) as of 10/28/2020 10:34  Ref. Range 10/27/2020 08:28 10/27/2020 13:27 10/27/2020 16:24 10/27/2020 20:22 10/28/2020 09:34  Glucose-Capillary Latest Ref Range: 70 - 99 mg/dL 528 (H) 413 (H) 244 (H) 207 (H) 370 (H)   Review of Glycemic Control  Diabetes history: DM2 Outpatient Diabetes medications: Novolog 40 units QAM, 20 units QPM plus Novolin R sliding scale  Current orders for Inpatient glycemic control: Lantus 16 units daily, Novolog 0-15 units TID with meals, Novolog 0-5 units QHS  Inpatient Diabetes Program Recommendations:    Insulin: Please consider increasing Lantus to 20 units daily and ordering Novolog 5 units TID with meals for meal coverage if patient eats at least 50% of meals.  Thanks, Orlando Penner, RN, MSN, CDE Diabetes Coordinator Inpatient Diabetes Program [phone] (Team Pager from 8am to 5pm)

## 2020-10-28 NOTE — Progress Notes (Signed)
Progress Note  Patient Name: Travis Lucas Date of Encounter: 10/28/2020  [facility] Cardiologist: Dr. Azucena Cecil  Subjective   Reports improvement in breathing. No CP.  Inpatient Medications    Scheduled Meds: . atorvastatin  80 mg Oral Daily  . enoxaparin (LOVENOX) injection  40 mg Subcutaneous Q24H  . furosemide  40 mg Intravenous BID  . insulin aspart  0-15 Units Subcutaneous TID WC  . insulin aspart  0-5 Units Subcutaneous QHS  . insulin glargine  16 Units Subcutaneous Daily  . living well with diabetes book   Does not apply Once  . losartan  25 mg Oral Daily  . metoprolol succinate  50 mg Oral Daily  . sodium chloride flush  3 mL Intravenous Q12H   Continuous Infusions: . sodium chloride     PRN Meds: sodium chloride, acetaminophen, labetalol, ondansetron (ZOFRAN) IV, sodium chloride flush   Vital Signs    Vitals:   10/27/20 1621 10/27/20 1933 10/28/20 0350 10/28/20 0823  BP: (!) 125/100 (!) 133/96 (!) 134/91 (!) 138/102  Pulse: 90 98 98 (!) 103  Resp: 19 17 17 18   Temp: 98.6 F (37 C) 97.9 F (36.6 C) 98.4 F (36.9 C) 98 F (36.7 C)  TempSrc: Oral Oral Oral   SpO2: 98% 100% 98% 95%  Weight:   85.2 kg   Height:        Intake/Output Summary (Last 24 hours) at 10/28/2020 0907 Last data filed at 10/28/2020 0546 Gross per 24 hour  Intake 480 ml  Output 3950 ml  Net -3470 ml   Last 3 Weights 10/28/2020 10/27/2020 10/26/2020  Weight (lbs) 187 lb 14.4 oz 196 lb 3.2 oz 180 lb  Weight (kg) 85.231 kg 88.996 kg 81.647 kg      Telemetry    NSR-ST, 90-100s (currently not attached) - Personally Reviewed  ECG    No new tracings- Personally Reviewed  Physical Exam   GEN: No acute distress.   Neck:JVP ~12cm Cardiac: tachycardic but regular, no murmurs, rubs, or gallops.  Respiratory: Bibasilar reduced breath sounds GI: Soft, nontender, non-distended  MS: 2+ bilateral edema; No deformity. Neuro:  Nonfocal  Psych: Normal affect   Labs    High  Sensitivity Troponin:   Recent Labs  Lab 10/26/20 1600  TROPONINIHS 20*      Chemistry Recent Labs  Lab 10/26/20 1600 10/27/20 0420 10/28/20 0457  NA 136 139 142  K 4.3 3.7 3.2*  CL 105 104 103  CO2 24 24 27   GLUCOSE 364* 289* 161*  BUN 22* 20 18  CREATININE 0.96 0.95 0.75  CALCIUM 7.7* 8.2* 7.9*  PROT  --   --  5.7*  ALBUMIN  --   --  2.2*  AST  --   --  40  ALT  --   --  45*  ALKPHOS  --   --  440*  BILITOT  --   --  1.0  GFRNONAA >60 >60 >60  ANIONGAP 7 11 12      Hematology Recent Labs  Lab 10/26/20 1600 10/28/20 0457  WBC 5.8 5.8  RBC 4.02* 3.97*  HGB 11.2* 11.3*  HCT 35.9* 35.1*  MCV 89.3 88.4  MCH 27.9 28.5  MCHC 31.2 32.2  RDW 15.9* 15.9*  PLT 227 228    BNP Recent Labs  Lab 10/26/20 1600  BNP 1,396.1*     DDimer No results for input(s): DDIMER in the last 168 hours.   Radiology    DG Chest 2 View  Result Date: 10/26/2020 CLINICAL DATA:  Lower extremity edema for several days, shortness of breath EXAM: CHEST - 2 VIEW COMPARISON:  09/26/2020 FINDINGS: Frontal and lateral views of the chest demonstrates stable AICD. Cardiac silhouette is enlarged. Slight increase in central vascular congestion, with interval development of small bilateral pleural effusions. No acute airspace disease. No pneumothorax. IMPRESSION: 1. Mild congestive heart failure, with increased central vascular congestion and small bilateral effusions. Electronically Signed   By: Sharlet Salina M.D.   On: 10/26/2020 16:33    Cardiac Studies   Pending echo On discussion with MD, echo EF <20%  Patient Profile     49 y.o. male with a hx of systolic heart failure (EF currently unknown on review of copied [place] records), s/p AICD (per records, [facility] 2020), CAD, ICM, hypertension, hyperlipidemia, DM2, GERD, gastric ulcer, migraines, pancreatitis, vertigo, chronic constipation, recent 08/2020 MRSA positive status / bacteremia, current alcohol/tobacco/marijuana use, and who is being seen  today for the evaluation of acute on chronic heart failure.  Assessment & Plan    1. Acute on chronic systolic heart failure s/p AICD (EF unknown) ? Volume up on exam.  Reports significant sx and DOE with minimal activity. ? Continue IV diuresis with Lasix 40 mg twice daily.    Transition to oral diuresis before discharge. ? I's/O's.   Net -4.2L yesterday. ? Standing weights.    Wt 81.6kg  85.2kg. ? Accuracy? ? Echo pending - per MD, EF <20%.  ? Per MD UDS pending given reduced EF.  ? Continue home losartan 25 mg qd. Continue BB.  As below, increased dose of BB given room in HR and BP to Toprol XL 50mg . If additional BP support needed (and pending UDS results), consider Coreg over Toprol. Pending echo results, could consider Entresto if financial feasible.  Daily BMET.    Replete electrolytes as needed.  CHF education  2. Hypokalemia  K 3.2.  Replete with goal 4.0.   Check Mg with goal 2.0.  3. CAD  No chest pain. Medical records indicate a history of CAD. S/p AICD. High-sensitivity troponin minimally elevated at 20.  EKG without acute ST/T changes.    Not consistent with ACS. Suspect supply demand ischemia in the setting of volume overload.  No indication for IV heparin at this time. Continue Lovenox.  No plan for cardiac catheterization at this time.    Echo pending as above with preliminary MD read of EF less than 20%.  Continue medical management and escalate as tolerated per GDMT.  ASA 81 mg daily, Toprol XL, ARB, statin.    Recommend high intensity statin for risk factor modification. Increased dose of statin today to atorvastatin 80mg  qd. Recheck ALTs and LDL in 6-8 weeks. ALT elevated at this time but still acceptable with close monitoring of LFTs.  Increased BB today for rate/BP control.   Could also consider Coreg if additional BP support needed this admission / pending UDS.  4. Sinus tachycardia ? Continue BB and escalate as tolerated by BP with  previous dose of Toprol 100 mg.   ? Recommend increased dose BB to Toprol 50mg , given current rate and room in BP for now. ? Maintain electrolytes at goal.   5. HTN  Continue Toprol 25 mg daily. Continue losartan 25 mg qd.  Escalate Toprol as tolerated by BP. Consider Coreg pending UDS and if further BP support needed. Ideally, transition to [facility] if financially feasible and EF reduced below 20% as per initial MD report (pending official  echo findings).  6. Current polysubstance use  EtOH, marijuana, tobacco.  Complete cessation advised.  7. HLD 1. Continue high intensity statin.  2. LFTs with ALT elevated but still acceptable.  3. Recheck LFTs and ALT in 6-8 weeks.   8. Anemia ? Daily CBC.  ? Further workup per IM.  Remainder per IM.  For questions or updates, please contact [facility] Please consult [email or web address] for contact info under        Signed, Lennon Alstrom, PA-C  10/28/2020, 9:07 AM

## 2020-10-28 NOTE — Plan of Care (Signed)
  Problem: Education: Goal: Ability to verbalize understanding of medication therapies will improve Outcome: Progressing   Problem: Education: Goal: Individualized Educational Video(s) Outcome: Progressing   Problem: Cardiac: Goal: Ability to achieve and maintain adequate cardiopulmonary perfusion will improve Outcome: Progressing

## 2020-10-28 NOTE — Progress Notes (Signed)
PROGRESS NOTE    Travis Lucas  UEA:[id number] DOB: 1971/01/02 DOA: 10/26/2020 PCP: Patient, No Pcp Per   Brief Narrative: Taken from prior notes. 49 year old male, homeless, recently moved to [place] from [place] on October 02, 2020, currently residing with his sister and plans to remain in [place], past medical history significant for nonischemic cardiomyopathy, chronic systolic CHF, AICD placed 09/20/2019, type II DM/IDDM, hypertension, hyperlipidemia, CAD, COOP, ongoing tobacco use disorder, THC use disorder, depression, GERD, gastric ulcer, MRSA bacteremia?  Completed antibiotic course, previously on home oxygen but currently not, presented to the ED on 10/26/2020 due to marked lower extremity edema, dyspnea and orthopnea in the context of noncompliance with his medications for more than a month.  He reportedly underwent cardiac cath recently at outside hospital and was told that his "arteries were clean".  Admitted for acute on chronic systolic CHF.  Cardiology was consulted   Subjective: Patient was lying comfortably when seen today.  No new complaint.  Denies any orthopnea or PND.  Assessment & Plan:   Principal Problem:   Acute on chronic systolic CHF (congestive heart failure) (HCC) Active Problems:   Insulin-requiring or dependent type II diabetes mellitus (HCC)   BOOP (bronchiolitis obliterans with organizing pneumonia) (HCC)   Hypertensive urgency   Primary hypertension   Smoking  Acute on chronic HFrEF/nonischemic cardiomyopathy s/p AICD. Diuresing well.  Able to lay flat, continue to have significant lower extremity edema.  Repeat echocardiogram with EF of less than 20%. Cardiology was consulted-appreciate their recommendations. -Continue with IV Lasix twice daily. -Continue with aspirin, Imdur, Toprol, Aldactone, Cozaar. -Continue with strict intake and output. -Daily BMP and weight.  Essential hypertension.  Initially admitted with hypertensive urgency.   Blood pressure within goal now. -Patient is on multiple medications. -Continue current management.  Hyperlipidemia. -Continue Lipitor.  Type 2 diabetes mellitus.  CBG improved after starting Lantus. -Continue with Lantus and SSI.  Polysubstance abuse (tobacco, THC).  UDS still pending Cessation counseled.  Anemia, suspect of chronic disease Follow CBC.  Cryptogenic organizing pneumonia: Recommend outpatient pulmonology consultation upon discharge.  History of MRSA bacteremia Patient reports completing treatment, partly in [place] and subsequently in [place].  Homeless TOC consulted for assistance  Body mass index is 22.5 kg/m.  Objective: Vitals:   10/28/20 0350 10/28/20 0823 10/28/20 1225 10/28/20 1606  BP: (!) 134/91 (!) 138/102 124/88 111/81  Pulse: 98 (!) 103 96 91  Resp: 17 18 18 18   Temp: 98.4 F (36.9 C) 98 F (36.7 C) 97.7 F (36.5 C) (!) 97.5 F (36.4 C)  TempSrc: Oral   Oral  SpO2: 98% 95% 100% 90%  Weight: 85.2 kg     Height:        Intake/Output Summary (Last 24 hours) at 10/28/2020 1749 Last data filed at 10/28/2020 1247 Gross per 24 hour  Intake 1680 ml  Output 4850 ml  Net -3170 ml   Filed Weights   10/26/20 1545 10/27/20 1400 10/28/20 0350  Weight: 81.6 kg 89 kg 85.2 kg    Examination:  General exam: Appears calm and comfortable  Respiratory system: Clear to auscultation. Respiratory effort normal. Cardiovascular system: S1 & S2 heard, RRR.  Gastrointestinal system: Soft, nontender, nondistended, bowel sounds positive. Central nervous system: Alert and oriented. No focal neurological deficits.Symmetric 5 x 5 power. Extremities: 2+ LE edema, no cyanosis, pulses intact and symmetrical. Psychiatry: Judgement and insight appear normal.    DVT prophylaxis: Lovenox Code Status: Full Family Communication: Discussed with patient Disposition Plan:  Status is: Inpatient  Remains inpatient appropriate because:Inpatient level  of care appropriate due to severity of illness   Dispo: The patient is from: Home              Anticipated d/c is to: Home              Anticipated d/c date is: 2 days              Patient currently is not medically stable to d/c.  Consultants:   Cardiology  Procedures:  Antimicrobials:   Data Reviewed: I have personally reviewed following labs and imaging studies  CBC: Recent Labs  Lab 10/26/20 1600 10/28/20 0457  WBC 5.8 5.8  HGB 11.2* 11.3*  HCT 35.9* 35.1*  MCV 89.3 88.4  PLT 227 228   Basic Metabolic Panel: Recent Labs  Lab 10/26/20 1600 10/27/20 0420 10/28/20 0457  NA 136 139 142  K 4.3 3.7 3.2*  CL 105 104 103  CO2 24 24 27   GLUCOSE 364* 289* 161*  BUN 22* 20 18  CREATININE 0.96 0.95 0.75  CALCIUM 7.7* 8.2* 7.9*   GFR: Estimated Creatinine Clearance: 135 mL/min (by C-G formula based on SCr of 0.75 mg/dL). Liver Function Tests: Recent Labs  Lab 10/28/20 0457  AST 40  ALT 45*  ALKPHOS 440*  BILITOT 1.0  PROT 5.7*  ALBUMIN 2.2*   No results for input(s): LIPASE, AMYLASE in the last 168 hours. No results for input(s): AMMONIA in the last 168 hours. Coagulation Profile: No results for input(s): INR, PROTIME in the last 168 hours. Cardiac Enzymes: No results for input(s): CKTOTAL, CKMB, CKMBINDEX, TROPONINI in the last 168 hours. BNP (last 3 results) No results for input(s): PROBNP in the last 8760 hours. HbA1C: Recent Labs    10/27/20 1155  HGBA1C 11.9*   CBG: Recent Labs  Lab 10/27/20 1624 10/27/20 2022 10/28/20 0934 10/28/20 1223 10/28/20 1607  GLUCAP 296* 207* 370* 196* 81   Lipid Profile: No results for input(s): CHOL, HDL, LDLCALC, TRIG, CHOLHDL, LDLDIRECT in the last 72 hours. Thyroid Function Tests: Recent Labs    10/27/20 0420  TSH 2.707   Anemia Panel: No results for input(s): VITAMINB12, FOLATE, FERRITIN, TIBC, IRON, RETICCTPCT in the last 72 hours. Sepsis Labs: No results for input(s): PROCALCITON, LATICACIDVEN in  the last 168 hours.  Recent Results (from the past 240 hour(s))  Resp Panel by RT-PCR (Flu A&B, Covid) Nasopharyngeal Swab     Status: None   Collection Time: 10/26/20 11:48 PM   Specimen: Nasopharyngeal Swab; Nasopharyngeal(NP) swabs in vial transport medium  Result Value Ref Range Status   SARS Coronavirus 2 by RT PCR NEGATIVE NEGATIVE Final    Comment: (NOTE) SARS-CoV-2 target nucleic acids are NOT DETECTED.  The SARS-CoV-2 RNA is generally detectable in upper respiratory specimens during the acute phase of infection. The lowest concentration of SARS-CoV-2 viral copies this assay can detect is 138 copies/mL. A negative result does not preclude SARS-Cov-2 infection and should not be used as the sole basis for treatment or other patient management decisions. A negative result may occur with  improper specimen collection/handling, submission of specimen other than nasopharyngeal swab, presence of viral mutation(s) within the areas targeted by this assay, and inadequate number of viral copies(<138 copies/mL). A negative result must be combined with clinical observations, patient history, and epidemiological information. The expected result is Negative.  Fact Sheet for Patients:  10/28/20  Fact Sheet for Healthcare Providers:  BloggerCourse.com  This test is no  t yet approved or cleared by the [place] and  has been authorized for detection and/or diagnosis of SARS-CoV-2 by FDA under an Emergency Use Authorization (EUA). This EUA will remain  in effect (meaning this test can be used) for the duration of the COVID-19 declaration under Section 564(b)(1) of the Act, 21 U.S.C.section 360bbb-3(b)(1), unless the authorization is terminated  or revoked sooner.       Influenza A by PCR NEGATIVE NEGATIVE Final   Influenza B by PCR NEGATIVE NEGATIVE Final    Comment: (NOTE) The Xpert Xpress SARS-CoV-2/FLU/RSV plus assay  is intended as an aid in the diagnosis of influenza from Nasopharyngeal swab specimens and should not be used as a sole basis for treatment. Nasal washings and aspirates are unacceptable for Xpert Xpress SARS-CoV-2/FLU/RSV testing.  Fact Sheet for Patients: BloggerCourse.com  Fact Sheet for Healthcare Providers: SeriousBroker.it  This test is not yet approved or cleared by the [place] FDA and has been authorized for detection and/or diagnosis of SARS-CoV-2 by FDA under an Emergency Use Authorization (EUA). This EUA will remain in effect (meaning this test can be used) for the duration of the COVID-19 declaration under Section 564(b)(1) of the Act, 21 U.S.C. section 360bbb-3(b)(1), unless the authorization is terminated or revoked.  Performed at [facility], [address] [place], [place] [address]      Radiology Studies: ECHOCARDIOGRAM COMPLETE  Result Date: 10/27/2020    ECHOCARDIOGRAM REPORT   Patient Name:   Travis Lucas Date of Exam: 10/27/2020 Medical Rec #:  [id number]    Height:       75.0 in Accession #:    [id number]   Weight:       180.0 lb Date of Birth:  1971-01-21    BSA:          2.098 m Patient Age:    49 years     BP:           139/106 mmHg Patient Gender: M            HR:           90 bpm. Exam Location:  [facility] Procedure: 2D Echo, Color Doppler, Cardiac Doppler and Strain Analysis Indications:     I50.21 CHF-Acute Systolic  History:         Patient has no prior history of Echocardiogram examinations.                  CHF; Risk Factors:Diabetes.  Sonographer:     Humphrey Rolls RDCS (AE) Referring Phys:  [id number] Lennon Alstrom Diagnosing Phys: Debbe Odea MD  Sonographer Comments: Global longitudinal strain was attempted. IMPRESSIONS  1. Left ventricular ejection fraction, by estimation, is <20%. The left ventricle has severely decreased function. The left ventricle demonstrates global hypokinesis.  The left ventricular internal cavity size was moderately to severely dilated. Left ventricular diastolic parameters are consistent with Grade III diastolic dysfunction (restrictive).  2. Right ventricular systolic function is severely reduced. The right ventricular size is mildly enlarged. There is moderately elevated pulmonary artery systolic pressure.  3. Left atrial size was moderately dilated.  4. Right atrial size was moderately dilated.  5. The mitral valve is normal in structure. Moderate mitral valve regurgitation.  6. The aortic valve is tricuspid. Aortic valve regurgitation is not visualized.  7. The inferior vena cava is dilated in size with <50% respiratory variability, suggesting right atrial pressure of 15 mmHg. FINDINGS  Left Ventricle: 3D LVEF obtained but not  reported due to inacurate tracking. Left ventricular ejection fraction, by estimation, is <20%. The left ventricle has severely decreased function. The left ventricle demonstrates global hypokinesis. Global longitudinal strain performed but not reported based on interpreter judgement due to suboptimal tracking. The left ventricular internal cavity size was moderately to severely dilated. There is no left ventricular hypertrophy. Left ventricular diastolic parameters are consistent with Grade III diastolic dysfunction (restrictive). Right Ventricle: The right ventricular size is mildly enlarged. No increase in right ventricular wall thickness. Right ventricular systolic function is severely reduced. There is moderately elevated pulmonary artery systolic pressure. The tricuspid regurgitant velocity is 2.99 m/s, and with an assumed right atrial pressure of 15 mmHg, the estimated right ventricular systolic pressure is 50.8 mmHg. Left Atrium: Left atrial size was moderately dilated. Right Atrium: Right atrial size was moderately dilated. Pericardium: Trivial pericardial effusion is present. Mitral Valve: The mitral valve is normal in structure.  Moderate mitral valve regurgitation. MV peak gradient, 4.7 mmHg. The mean mitral valve gradient is 2.0 mmHg. Tricuspid Valve: The tricuspid valve is normal in structure. Tricuspid valve regurgitation is mild. Aortic Valve: The aortic valve is tricuspid. Aortic valve regurgitation is not visualized. Aortic valve mean gradient measures 2.0 mmHg. Aortic valve peak gradient measures 3.1 mmHg. Aortic valve area, by VTI measures 2.06 cm. Pulmonic Valve: The pulmonic valve was normal in structure. Pulmonic valve regurgitation is mild. Aorta: The aortic root is normal in size and structure. Venous: The inferior vena cava is dilated in size with less than 50% respiratory variability, suggesting right atrial pressure of 15 mmHg. IAS/Shunts: No atrial level shunt detected by color flow Doppler. Additional Comments: A pacer wire is visualized.  LEFT VENTRICLE PLAX 2D LVIDd:         6.79 cm  Diastology LVIDs:         5.67 cm  LV e' medial:    3.37 cm/s LV PW:         1.45 cm  LV E/e' medial:  31.5 LV IVS:        0.79 cm  LV e' lateral:   6.64 cm/s LVOT diam:     2.00 cm  LV E/e' lateral: 16.0 LV SV:         32 LV SV Index:   15 LVOT Area:     3.14 cm                          3D Volume EF:                         3D EF:        39 %                         LV EDV:       295 ml                         LV ESV:       180 ml                         LV SV:        115 ml RIGHT VENTRICLE RV Basal diam:  5.41 cm LEFT ATRIUM             Index       RIGHT ATRIUM  Index LA diam:        4.90 cm 2.34 cm/m  RA Area:     28.40 cm LA Vol (A2C):   91.6 ml 43.65 ml/m RA Volume:   91.10 ml  43.42 ml/m LA Vol (A4C):   94.8 ml 45.18 ml/m LA Biplane Vol: 96.2 ml 45.85 ml/m  AORTIC VALVE                   PULMONIC VALVE AV Area (Vmax):    2.36 cm    PV Vmax:       0.54 m/s AV Area (Vmean):   2.17 cm    PV Vmean:      37.500 cm/s AV Area (VTI):     2.06 cm    PV VTI:        0.100 m AV Vmax:           88.70 cm/s  PV Peak grad:  1.2  mmHg AV Vmean:          69.500 cm/s PV Mean grad:  1.0 mmHg AV VTI:            0.154 m AV Peak Grad:      3.1 mmHg AV Mean Grad:      2.0 mmHg LVOT Vmax:         66.60 cm/s LVOT Vmean:        47.900 cm/s LVOT VTI:          0.101 m LVOT/AV VTI ratio: 0.66  AORTA Ao Root diam: 3.70 cm MITRAL VALVE                TRICUSPID VALVE MV Area (PHT): 5.58 cm     TR Peak grad:   35.8 mmHg MV Peak grad:  4.7 mmHg     TR Vmax:        299.00 cm/s MV Mean grad:  2.0 mmHg MV Vmax:       1.08 m/s     SHUNTS MV Vmean:      70.1 cm/s    Systemic VTI:  0.10 m MV Decel Time: 136 msec     Systemic Diam: 2.00 cm MV E velocity: 106.00 cm/s MV A velocity: 47.60 cm/s MV E/A ratio:  2.23 Debbe Odea MD Electronically signed by Debbe Odea MD Signature Date/Time: 10/27/2020/4:53:10 PM    Final     Scheduled Meds: . atorvastatin  80 mg Oral Daily  . enoxaparin (LOVENOX) injection  40 mg Subcutaneous Q24H  . furosemide  40 mg Intravenous BID  . insulin aspart  0-15 Units Subcutaneous TID WC  . insulin aspart  0-5 Units Subcutaneous QHS  . insulin glargine  16 Units Subcutaneous Daily  . living well with diabetes book   Does not apply Once  . losartan  25 mg Oral Daily  . metoprolol succinate  50 mg Oral Daily  . sodium chloride flush  3 mL Intravenous Q12H   Continuous Infusions: . sodium chloride       LOS: 1 day   Time spent: 30 minutes  Arnetha Courser, MD [facility]  If 7PM-7AM, please contact night-coverage [email or web address]  10/28/2020, 5:49 PM   This record has been created using Conservation officer, historic buildings. Errors have been sought and corrected,but may not always be located. Such creation errors do not reflect on the standard of care.

## 2020-10-28 NOTE — Progress Notes (Signed)
Mobility Specialist - Progress Note   10/28/20 1500  Mobility  Activity Ambulated in room  Level of Assistance Independent  Assistive Device None  Distance Ambulated (ft) 100 ft  Mobility Response Tolerated well  Mobility performed by Mobility specialist  $Mobility charge 1 Mobility    Pre-mobility: 93 HR, 109/83 BP, 95% SpO2 During mobility: 101 HR, 97% SpO2 Post-mobility: 99 HR, 99% SpO2   Pt was lying in bed upon arrival utilizing room air. Pt agreed to session. Pt denied any pain, nausea, or fatigue but did c/o his feet tingling. Pt was independent in all transfers this date, including ambulation. Pt ambulated 100' in room with no LOB noted. Pt denied SOB, weakness, or dizziness. No AD used throughout activity. Overall, pt tolerated session well. Pt was left in bed with all needs in reach.    Filiberto Pinks Mobility Specialist 10/28/20, 3:41 PM

## 2020-10-29 DIAGNOSIS — E119 Type 2 diabetes mellitus without complications: Secondary | ICD-10-CM

## 2020-10-29 DIAGNOSIS — I160 Hypertensive urgency: Secondary | ICD-10-CM

## 2020-10-29 DIAGNOSIS — Z794 Long term (current) use of insulin: Secondary | ICD-10-CM

## 2020-10-29 LAB — BASIC METABOLIC PANEL
Anion gap: 9 (ref 5–15)
BUN: 22 mg/dL — ABNORMAL HIGH (ref 6–20)
CO2: 26 mmol/L (ref 22–32)
Calcium: 7.9 mg/dL — ABNORMAL LOW (ref 8.9–10.3)
Chloride: 101 mmol/L (ref 98–111)
Creatinine, Ser: 0.73 mg/dL (ref 0.61–1.24)
GFR, Estimated: >60 mL/min (ref >60)
Glucose, Bld: 260 mg/dL — ABNORMAL HIGH (ref 70–99)
Potassium: 3.9 mmol/L (ref 3.5–5.1)
Sodium: 136 mmol/L (ref 135–145)

## 2020-10-29 LAB — GLUCOSE, CAPILLARY
Glucose-Capillary: 283 mg/dL — ABNORMAL HIGH (ref 70–99)
Glucose-Capillary: 187 mg/dL — ABNORMAL HIGH (ref 70–99)
Glucose-Capillary: 281 mg/dL — ABNORMAL HIGH (ref 70–99)
Glucose-Capillary: 192 mg/dL — ABNORMAL HIGH (ref 70–99)
Glucose-Capillary: 212 mg/dL — ABNORMAL HIGH (ref 70–99)

## 2020-10-29 MED ORDER — LIDOCAINE 5 % EX PTCH
1.0000 | MEDICATED_PATCH | CUTANEOUS | Status: DC
  Administered 2020-10-29 – 2020-11-01 (×4): 1 via TRANSDERMAL
  Filled 2020-10-29 (×6): qty 1

## 2020-10-29 MED ORDER — ENOXAPARIN SODIUM 40 MG/0.4ML ~~LOC~~ SOLN
40.0000 mg | SUBCUTANEOUS | Status: DC
  Administered 2020-10-29 – 2020-11-02 (×5): 40 mg via SUBCUTANEOUS
  Filled 2020-10-29 (×4): qty 0.4

## 2020-10-29 MED ORDER — INSULIN GLARGINE 100 UNIT/ML ~~LOC~~ SOLN
20.0000 [IU] | Freq: Every day | SUBCUTANEOUS | Status: DC
  Administered 2020-10-30 – 2020-11-02 (×4): 20 [IU] via SUBCUTANEOUS
  Filled 2020-10-29 (×4): qty 0.2

## 2020-10-29 NOTE — Progress Notes (Addendum)
  Heart Failure Nurse Navigator Note  Stopped by patients room for visit but he was asleep, lying supine, no acute distress noted.   HFrEF< 20 %  He has ICD.   Medications:  Lipitor 80 mg daily Losartan 25 mg daily Metoprolol succinate 50 mg daily   Labs:  Sodium 136, potassium 3.9, chloride 101, CO2 26, BUN 22, creatinine 0.73 and a GFR greater than 60.  Weight 86 kg Blood pressure 117/90 pulse 96 BMI 23.71  Nurse - Darel Hong reports that he is not following with fluid restrictions nor his diet restrictions "wanted three bowls of cereal for breakfast."  Further cardiologist note he recommends that the patient stay until his leg edema is resolved with the IV furosemide.  Also question if he would not benefit from addition of SGLT 2 inhibitor and MRA(spironolactone) added to his regimen.   Tresa Endo RN,CHFN

## 2020-10-29 NOTE — Progress Notes (Signed)
Inpatient Diabetes Program Recommendations  AACE/ADA: New Consensus Statement on Inpatient Glycemic Control (2015)  Target Ranges:  Prepandial:   less than 140 mg/dL      Peak postprandial:   less than 180 mg/dL (1-2 hours)      Critically ill patients:  140 - 180 mg/dL   Lab Results  Component Value Date   GLUCAP 281 (H) 10/29/2020   HGBA1C 11.9 (H) 10/27/2020    Review of Glycemic Control Results for Travis Lucas, Travis Lucas (MRN [id number]) as of 10/29/2020 12:37  Ref. Range 10/29/2020 04:01 10/29/2020 08:19 10/29/2020 11:24  Glucose-Capillary Latest Ref Range: 70 - 99 mg/dL 902 (H) 409 (H) 735 (H)   Diabetes history: DM2 Outpatient Diabetes medications: Novolog 40 units QAM, 20 units QPM plus Novolin R sliding scale  Current orders for Inpatient glycemic control: Lantus 16 units daily, Novolog 0-15 units TID with meals, Novolog 0-5 units QHS  Inpatient Diabetes Program Recommendations:    Insulin: Please consider increasing Lantus to 20 units daily and ordering Novolog 5 units TID with meals for meal coverage if patient eats at least 50% of meals.  If patient to be discharged today, will need to consider option of 75/25 14 units BID. Awaiting TOC consult.  Thanks, Lujean Rave, MSN, RNC-OB Diabetes Coordinator [phone] (8a-5p)

## 2020-10-29 NOTE — Progress Notes (Signed)
PROGRESS NOTE    Travis Lucas  OZD:[id number] DOB: 04-10-1971 DOA: 10/26/2020 PCP: Patient, No Pcp Per   Brief Narrative: Taken from prior notes. 49 year old male, homeless, recently moved to [place] from [place] on October 02, 2020, currently residing with his sister and plans to remain in [place], past medical history significant for nonischemic cardiomyopathy, chronic systolic CHF, AICD placed 09/20/2019, type II DM/IDDM, hypertension, hyperlipidemia, CAD, COOP, ongoing tobacco use disorder, THC use disorder, depression, GERD, gastric ulcer, MRSA bacteremia?  Completed antibiotic course, previously on home oxygen but currently not, presented to the ED on 10/26/2020 due to marked lower extremity edema, dyspnea and orthopnea in the context of noncompliance with his medications for more than a month.  He reportedly underwent cardiac cath recently at outside hospital and was told that his "arteries were clean".  Admitted for acute on chronic systolic CHF.  Cardiology was consulted   Subjective: Patient was complaining of lower back pain, stating that Tylenol and Aleve does not work. Per patient he had been history of back pain for a long time now.  Assessment & Plan:   Principal Problem:   Acute on chronic systolic CHF (congestive heart failure) (HCC) Active Problems:   Insulin-requiring or dependent type II diabetes mellitus (HCC)   BOOP (bronchiolitis obliterans with organizing pneumonia) (HCC)   Hypertensive urgency   Primary hypertension   Smoking  Acute on chronic HFrEF/nonischemic cardiomyopathy s/p AICD. Diuresing well.  Able to lay flat, continue to have significant lower extremity edema.  Repeat echocardiogram with EF of less than 20%. Cardiology was consulted-appreciate their recommendations, they want to continue with IV diuresis as he is still has quite a bit of lower extremity edema. -Continue with IV Lasix twice daily. -Continue with aspirin, Imdur, Toprol,  Aldactone, Cozaar. -Continue with strict intake and output. -Daily BMP and weight.  Essential hypertension.  Initially admitted with hypertensive urgency.  Blood pressure within goal now. -Patient is on multiple medications. -Continue current management.  Backache. Seems chronic. -Lidocaine patch. -We will avoid any opioids.  Hyperlipidemia. -Continue Lipitor.  Type 2 diabetes mellitus.  CBG elevated. -Increase Lantus to 20 units daily. -Continue with SSI.  Polysubstance abuse (tobacco, THC).  UDS positive for cannabinoid. Cessation counseled.  Anemia, suspect of chronic disease Follow CBC.  Cryptogenic organizing pneumonia: Recommend outpatient pulmonology consultation upon discharge.  History of MRSA bacteremia Patient reports completing treatment, partly in [place] and subsequently in [place].  Homeless TOC consulted for assistance  Body mass index is 22.5 kg/m.  Objective: Vitals:   10/29/20 0250 10/29/20 0359 10/29/20 0820 10/29/20 1128  BP:  (!) 108/94 (!) 120/92 117/90  Pulse:  92 (!) 104 96  Resp:  16 16 16   Temp:  98.7 F (37.1 C) 97.8 F (36.6 C) 98.6 F (37 C)  TempSrc:  Oral  Oral  SpO2:  98% 100% 100%  Weight: 86 kg 86 kg    Height:        Intake/Output Summary (Last 24 hours) at 10/29/2020 1448 Last data filed at 10/29/2020 1345 Gross per 24 hour  Intake 720 ml  Output 600 ml  Net 120 ml   Filed Weights   10/28/20 0350 10/29/20 0250 10/29/20 0359  Weight: 85.2 kg 86 kg 86 kg    Examination:  General. Well-developed gentleman, in no acute distress. Pulmonary.  Lungs clear bilaterally, normal respiratory effort. CV.  Regular rate and rhythm, no JVD, rub or murmur. Abdomen.  Soft, nontender, nondistended, BS positive. CNS.  Alert  and oriented x3.  No focal neurologic deficit. Extremities.  2+ LE edema, no cyanosis, pulses intact and symmetrical. Psychiatry.  Judgment and insight appears normal.  DVT prophylaxis:  Lovenox Code Status: Full Family Communication: Discussed with patient Disposition Plan:  Status is: Inpatient  Remains inpatient appropriate because:Inpatient level of care appropriate due to severity of illness   Dispo: The patient is from: Home              Anticipated d/c is to: Home              Anticipated d/c date is: 2 days              Patient currently is not medically stable to d/c.  Consultants:   Cardiology  Procedures:  Antimicrobials:   Data Reviewed: I have personally reviewed following labs and imaging studies  CBC: Recent Labs  Lab 10/26/20 1600 10/28/20 0457  WBC 5.8 5.8  HGB 11.2* 11.3*  HCT 35.9* 35.1*  MCV 89.3 88.4  PLT 227 228   Basic Metabolic Panel: Recent Labs  Lab 10/26/20 1600 10/27/20 0420 10/28/20 0457 10/29/20 0528  NA 136 139 142 136  K 4.3 3.7 3.2* 3.9  CL 105 104 103 101  CO2 24 24 27 26   GLUCOSE 364* 289* 161* 260*  BUN 22* 20 18 22*  CREATININE 0.96 0.95 0.75 0.73  CALCIUM 7.7* 8.2* 7.9* 7.9*   GFR: Estimated Creatinine Clearance: 135 mL/min (by C-G formula based on SCr of 0.73 mg/dL). Liver Function Tests: Recent Labs  Lab 10/28/20 0457  AST 40  ALT 45*  ALKPHOS 440*  BILITOT 1.0  PROT 5.7*  ALBUMIN 2.2*   No results for input(s): LIPASE, AMYLASE in the last 168 hours. No results for input(s): AMMONIA in the last 168 hours. Coagulation Profile: No results for input(s): INR, PROTIME in the last 168 hours. Cardiac Enzymes: No results for input(s): CKTOTAL, CKMB, CKMBINDEX, TROPONINI in the last 168 hours. BNP (last 3 results) No results for input(s): PROBNP in the last 8760 hours. HbA1C: Recent Labs    10/27/20 1155  HGBA1C 11.9*   CBG: Recent Labs  Lab 10/28/20 1607 10/28/20 2052 10/29/20 0401 10/29/20 0819 10/29/20 1124  GLUCAP 81 180* 283* 187* 281*   Lipid Profile: No results for input(s): CHOL, HDL, LDLCALC, TRIG, CHOLHDL, LDLDIRECT in the last 72 hours. Thyroid Function Tests: Recent  Labs    10/27/20 0420  TSH 2.707   Anemia Panel: No results for input(s): VITAMINB12, FOLATE, FERRITIN, TIBC, IRON, RETICCTPCT in the last 72 hours. Sepsis Labs: No results for input(s): PROCALCITON, LATICACIDVEN in the last 168 hours.  Recent Results (from the past 240 hour(s))  Resp Panel by RT-PCR (Flu A&B, Covid) Nasopharyngeal Swab     Status: None   Collection Time: 10/26/20 11:48 PM   Specimen: Nasopharyngeal Swab; Nasopharyngeal(NP) swabs in vial transport medium  Result Value Ref Range Status   SARS Coronavirus 2 by RT PCR NEGATIVE NEGATIVE Final    Comment: (NOTE) SARS-CoV-2 target nucleic acids are NOT DETECTED.  The SARS-CoV-2 RNA is generally detectable in upper respiratory specimens during the acute phase of infection. The lowest concentration of SARS-CoV-2 viral copies this assay can detect is 138 copies/mL. A negative result does not preclude SARS-Cov-2 infection and should not be used as the sole basis for treatment or other patient management decisions. A negative result may occur with  improper specimen collection/handling, submission of specimen other than nasopharyngeal swab, presence of viral mutation(s) within the  areas targeted by this assay, and inadequate number of viral copies(<138 copies/mL). A negative result must be combined with clinical observations, patient history, and epidemiological information. The expected result is Negative.  Fact Sheet for Patients:  BloggerCourse.com  Fact Sheet for Healthcare Providers:  SeriousBroker.it  This test is no t yet approved or cleared by the [place] FDA and  has been authorized for detection and/or diagnosis of SARS-CoV-2 by FDA under an Emergency Use Authorization (EUA). This EUA will remain  in effect (meaning this test can be used) for the duration of the COVID-19 declaration under Section 564(b)(1) of the Act, 21 U.S.C.section 360bbb-3(b)(1),  unless the authorization is terminated  or revoked sooner.       Influenza A by PCR NEGATIVE NEGATIVE Final   Influenza B by PCR NEGATIVE NEGATIVE Final    Comment: (NOTE) The Xpert Xpress SARS-CoV-2/FLU/RSV plus assay is intended as an aid in the diagnosis of influenza from Nasopharyngeal swab specimens and should not be used as a sole basis for treatment. Nasal washings and aspirates are unacceptable for Xpert Xpress SARS-CoV-2/FLU/RSV testing.  Fact Sheet for Patients: BloggerCourse.com  Fact Sheet for Healthcare Providers: SeriousBroker.it  This test is not yet approved or cleared by the [place] FDA and has been authorized for detection and/or diagnosis of SARS-CoV-2 by FDA under an Emergency Use Authorization (EUA). This EUA will remain in effect (meaning this test can be used) for the duration of the COVID-19 declaration under Section 564(b)(1) of the Act, 21 U.S.C. section 360bbb-3(b)(1), unless the authorization is terminated or revoked.  Performed at [facility], [address] [place], [place] [address]      Radiology Studies: No results found.  Scheduled Meds: . atorvastatin  80 mg Oral Daily  . enoxaparin (LOVENOX) injection  40 mg Subcutaneous Q24H  . furosemide  40 mg Intravenous BID  . insulin aspart  0-15 Units Subcutaneous TID WC  . insulin aspart  0-5 Units Subcutaneous QHS  . insulin glargine  16 Units Subcutaneous Daily  . lidocaine  1 patch Transdermal Q24H  . living well with diabetes book   Does not apply Once  . losartan  25 mg Oral Daily  . metoprolol succinate  50 mg Oral Daily  . sodium chloride flush  3 mL Intravenous Q12H   Continuous Infusions: . sodium chloride       LOS: 2 days   Time spent: 25 minutes  Arnetha Courser, MD [facility]  If 7PM-7AM, please contact night-coverage [email or web address]  10/29/2020, 2:48 PM   This record has been created using  Conservation officer, historic buildings. Errors have been sought and corrected,but may not always be located. Such creation errors do not reflect on the standard of care.

## 2020-10-29 NOTE — Progress Notes (Signed)
   Unable to examine the patient this AM as he is in the restroom each time I have stopped by his room this morning. Will attempt to examine him later morning or early afternoon.   Signed, Lennon Alstrom, PA-C 10/29/2020, 10:09 AM

## 2020-10-29 NOTE — Progress Notes (Signed)
Progress Note  Patient Name: Travis Lucas Date of Encounter: 10/29/2020  [facility] Cardiologist: No primary care provider on file.   Subjective   Minimally conversant this morning, denies any new complaints Still with massive leg edema, mild abdominal distention  Echocardiogram reviewed, ejection fraction less than 20% global hypokinesis Notes indicating medication noncompliance He was not very forthcoming with details   Inpatient Medications    Scheduled Meds: . atorvastatin  80 mg Oral Daily  . enoxaparin (LOVENOX) injection  40 mg Subcutaneous Q24H  . furosemide  40 mg Intravenous BID  . insulin aspart  0-15 Units Subcutaneous TID WC  . insulin aspart  0-5 Units Subcutaneous QHS  . insulin glargine  16 Units Subcutaneous Daily  . lidocaine  1 patch Transdermal Q24H  . living well with diabetes book   Does not apply Once  . losartan  25 mg Oral Daily  . metoprolol succinate  50 mg Oral Daily  . sodium chloride flush  3 mL Intravenous Q12H   Continuous Infusions: . sodium chloride     PRN Meds: sodium chloride, acetaminophen, labetalol, ondansetron (ZOFRAN) IV, sodium chloride flush   Vital Signs    Vitals:   10/29/20 0250 10/29/20 0359 10/29/20 0820 10/29/20 1128  BP:  (!) 108/94 (!) 120/92 117/90  Pulse:  92 (!) 104 96  Resp:  16 16 16   Temp:  98.7 F (37.1 C) 97.8 F (36.6 C) 98.6 F (37 C)  TempSrc:  Oral  Oral  SpO2:  98% 100% 100%  Weight: 86 kg 86 kg    Height:        Intake/Output Summary (Last 24 hours) at 10/29/2020 1432 Last data filed at 10/29/2020 1345 Gross per 24 hour  Intake 720 ml  Output 600 ml  Net 120 ml   Last 3 Weights 10/29/2020 10/29/2020 10/28/2020  Weight (lbs) 189 lb 11.2 oz 189 lb 11.2 oz 187 lb 14.4 oz  Weight (kg) 86.047 kg 86.047 kg 85.231 kg      Telemetry    NSR - Personally Reviewed  ECG     - Personally Reviewed  Physical Exam   GEN: No acute distress.   Neck:  JVD 10+ Cardiac: RRR, no murmurs, rubs,  or gallops.  2+ pitting lower extremity edema Respiratory: Clear to auscultation bilaterally. GI: Soft, nontender, non-distended  MS: No edema; No deformity. Neuro:  Nonfocal  Psych: Normal affect   Labs    High Sensitivity Troponin:   Recent Labs  Lab 10/26/20 1600  TROPONINIHS 20*      Chemistry Recent Labs  Lab 10/27/20 0420 10/28/20 0457 10/29/20 0528  NA 139 142 136  K 3.7 3.2* 3.9  CL 104 103 101  CO2 24 27 26   GLUCOSE 289* 161* 260*  BUN 20 18 22*  CREATININE 0.95 0.75 0.73  CALCIUM 8.2* 7.9* 7.9*  PROT  --  5.7*  --   ALBUMIN  --  2.2*  --   AST  --  40  --   ALT  --  45*  --   ALKPHOS  --  440*  --   BILITOT  --  1.0  --   GFRNONAA >60 >60 >60  ANIONGAP 11 12 9      Hematology Recent Labs  Lab 10/26/20 1600 10/28/20 0457  WBC 5.8 5.8  RBC 4.02* 3.97*  HGB 11.2* 11.3*  HCT 35.9* 35.1*  MCV 89.3 88.4  MCH 27.9 28.5  MCHC 31.2 32.2  RDW 15.9* 15.9*  PLT 227 228    BNP Recent Labs  Lab 10/26/20 1600  BNP 1,396.1*     DDimer No results for input(s): DDIMER in the last 168 hours.   Radiology    No results found.  Cardiac Studies   Echocardiogram 1. Left ventricular ejection fraction, by estimation, is <20%. The left  ventricle has severely decreased function. The left ventricle demonstrates  global hypokinesis. The left ventricular internal cavity size was  moderately to severely dilated. Left  ventricular diastolic parameters are consistent with Grade III diastolic  dysfunction (restrictive).  2. Right ventricular systolic function is severely reduced. The right  ventricular size is mildly enlarged. There is moderately elevated  pulmonary artery systolic pressure.  3. Left atrial size was moderately dilated.  4. Right atrial size was moderately dilated.  5. The mitral valve is normal in structure. Moderate mitral valve  regurgitation.  6. The aortic valve is tricuspid. Aortic valve regurgitation is not  visualized.  7.  The inferior vena cava is dilated in size with <50% respiratory  variability, suggesting right atrial pressure of 15 mmHg.    Patient Profile     49 year old gentleman with history of nonischemic cardiomyopathy, status post AICD 08/2019 [facility]), hypertension, hyperlipidemia, current smoker presenting with shortness of breath and extremity swelling.  Assessment & Plan    Nonischemic cardiomyopathy S/p ICD,  No recent outpatient follow-up, moved from [place] Still with massive lower extremity edema -On losartan 25 daily, metoprolol succinate 50 daily Lasix 40 IV twice daily Would continue IV diuresis until leg edema improves  Hyperlipidemia On Lipitor 80 daily  Hypertension Blood pressure reasonable, at times low No further titration of medication Continue losartan and metoprolol  Smoker Smoking cessation recommended    Total encounter time more than 25 minutes  Greater than 50% was spent in counseling and coordination of care with the patient   For questions or updates, please contact [facility] Please consult [email or web address] for contact info under        Signed, Julien Nordmann, MD  10/29/2020, 2:32 PM

## 2020-10-29 NOTE — Plan of Care (Signed)
  Problem: Education: Goal: Ability to verbalize understanding of medication therapies will improve Outcome: Progressing  Strict I&Os, daily weights , low salt and carb modified diet reviewed with patient with reinforcement needed.  Patient continues to void in commode without using urinal, Requests fluids and high carbohydrate foods (3 cereal bowls).

## 2020-10-30 DIAGNOSIS — I420 Dilated cardiomyopathy: Secondary | ICD-10-CM

## 2020-10-30 LAB — GLUCOSE, CAPILLARY
Glucose-Capillary: 268 mg/dL — ABNORMAL HIGH (ref 70–99)
Glucose-Capillary: 140 mg/dL — ABNORMAL HIGH (ref 70–99)
Glucose-Capillary: 156 mg/dL — ABNORMAL HIGH (ref 70–99)
Glucose-Capillary: 233 mg/dL — ABNORMAL HIGH (ref 70–99)

## 2020-10-30 LAB — BASIC METABOLIC PANEL
Anion gap: 10 (ref 5–15)
BUN: 19 mg/dL (ref 6–20)
CO2: 26 mmol/L (ref 22–32)
Calcium: 8 mg/dL — ABNORMAL LOW (ref 8.9–10.3)
Chloride: 102 mmol/L (ref 98–111)
Creatinine, Ser: 0.78 mg/dL (ref 0.61–1.24)
GFR, Estimated: >60 mL/min (ref >60)
Glucose, Bld: 277 mg/dL — ABNORMAL HIGH (ref 70–99)
Potassium: 4.1 mmol/L (ref 3.5–5.1)
Sodium: 138 mmol/L (ref 135–145)

## 2020-10-30 MED ORDER — SPIRONOLACTONE 25 MG PO TABS
25.0000 mg | ORAL_TABLET | Freq: Every day | ORAL | Status: DC
  Administered 2020-10-30 – 2020-11-01 (×3): 25 mg via ORAL
  Filled 2020-10-30 (×3): qty 1

## 2020-10-30 NOTE — Plan of Care (Signed)
  Problem: Education: Goal: Ability to demonstrate management of disease process will improve Outcome: Progressing Goal: Ability to verbalize understanding of medication therapies will improve Outcome: Progressing Goal: Individualized Educational Video(s) Outcome: Progressing   Problem: Activity: Goal: Capacity to carry out activities will improve Outcome: Progressing   Problem: Cardiac: Goal: Ability to achieve and maintain adequate cardiopulmonary perfusion will improve Outcome: Progressing   Problem: Education: Goal: Knowledge of General Education information will improve Description: Including pain rating scale, medication(s)/side effects and non-pharmacologic comfort measures Outcome: Progressing   Problem: Education: Goal: Ability to describe self-care measures that may prevent or decrease complications (Diabetes Survival Skills Education) will improve Outcome: Progressing Goal: Individualized Educational Video(s) Outcome: Progressing

## 2020-10-30 NOTE — Progress Notes (Signed)
Inpatient Diabetes Program Recommendations  AACE/ADA: New Consensus Statement on Inpatient Glycemic Control   Target Ranges:  Prepandial:   less than 140 mg/dL      Peak postprandial:   less than 180 mg/dL (1-2 hours)      Critically ill patients:  140 - 180 mg/dL  Results for RISHAV, ROCKEFELLER (MRN [id number]) as of 10/30/2020 09:26  Ref. Range 10/29/2020 08:19 10/29/2020 11:24 10/29/2020 16:28 10/29/2020 20:37 10/30/2020 09:20  Glucose-Capillary Latest Ref Range: 70 - 99 mg/dL 818 (H) 403 (H) 754 (H) 212 (H) 268 (H)    Review of Glycemic Control  Diabetes history: DM2 Outpatient Diabetes medications: Novolog 40 units QAM, 20 units QPM plus Novolin R sliding scale  Current orders for Inpatient glycemic control: Lantus 20 units daily, Novolog 0-15 units TID with meals, Novolog 0-5 units QHS  Inpatient Diabetes Program Recommendations:    Insulin: Please consider ordering Novolog 3 units TID with meals.  Outpatient: Due to lack of insurance and need for affordable insulin, please consider discharging on Humalog 75/25 14 units BID (dose will provide a total of 21 units for basal and 7 units for meal coverage per day).   Thanks, Orlando Penner, RN, MSN, CDE Diabetes Coordinator Inpatient Diabetes Program [phone] (Team Pager from 8am to 5pm)

## 2020-10-30 NOTE — Progress Notes (Signed)
   Heart Failure Nurse Navigator Note  HFrEF < 20 %  He presented with worsening lower extremity edema, dyspnea and orthopnea.  Comorbidities:  Diabetes type 2 Hypertension Hyperlipidemia Depression  Alcohol/TCH/tobacco use He has ICD.    Medications:  Atorvastatin 80 mg daily Furosemide 40 mg IV twice a day Losartan 25 mg daily Metoprolol succinate 50 mg daily  Labs:  Sodium 138, potassium 4.1, chloride 102, CO2 26, BUN 19 creatinine 0.78.  Weight 83.7 kg (86) Intake 1080 mL Output 1850 mL BMI 23.06 Blood pressure 122/78 with a pulse of 104.   Assessment  General-he is awake and alert sitting up in the bed in no acute distress playing games on his iPad.  HEENT pupils are equal reactive to light there is no JVD.  Cardiac-heart tones are regular rate and rhythm  Chest-lungs are clear to posterior auscultation he continues with productive cough of clear to white sputum.   Abdomen- rounded soft non tender.  Musculoskeletal legs remain edematous up to the top of both thighs, 2+  Neurologic-speech is clear moves all extremities without difficulty.  Psych is pleasant and appropriate he makes eye contact.   Patient states he knows that he has a couple more days to receive IV diuretic.  Asked patient if he would be willing to wear TED hose, he states that he is worn support stockings in the past and would be willing to do it again.  Discussed by teach back method how he is going to take care of himself once he goes home.  He states he knows he needs to make dietary changes, and is willing to do it.  He repeated that he would be on a 2000 mg sodium restriction along with 2000 cc fluid restriction daily.  He volunteered foods that are in his sister's kitchen that he should avoid.  Also volunteered no salt in his grits.  Discussed daily weights and recording and what to, needs reinforcement with 2 to 3 pounds weight gain overnight, knew that it was 5 pounds within  the week.      Tresa Endo RN, CHFN

## 2020-10-30 NOTE — Progress Notes (Signed)
Progress Note  Patient Name: Travis Lucas Date of Encounter: 10/30/2020  Primary Cardiologist: Agbor-Etang  Subjective   Up walking the halls this morning.  Continues to note lower extremity edema.  No chest pain.  Intermittent coughing episodes noted.  Documented urine output of 745 mL for the past 24 hours with a net -7.9 L for the admission.  Weight 86 trending to 83.7 kg over the past 24 hours.  Renal function stable.  Inpatient Medications    Scheduled Meds: . atorvastatin  80 mg Oral Daily  . enoxaparin (LOVENOX) injection  40 mg Subcutaneous Q24H  . furosemide  40 mg Intravenous BID  . insulin aspart  0-15 Units Subcutaneous TID WC  . insulin aspart  0-5 Units Subcutaneous QHS  . insulin glargine  20 Units Subcutaneous Daily  . lidocaine  1 patch Transdermal Q24H  . living well with diabetes book   Does not apply Once  . losartan  25 mg Oral Daily  . metoprolol succinate  50 mg Oral Daily  . sodium chloride flush  3 mL Intravenous Q12H   Continuous Infusions: . sodium chloride     PRN Meds: sodium chloride, acetaminophen, labetalol, ondansetron (ZOFRAN) IV, sodium chloride flush   Vital Signs    Vitals:   10/29/20 1629 10/29/20 2035 10/30/20 0437 10/30/20 0700  BP: (!) 123/94 119/90 122/78   Pulse: 90 98 (!) 104   Resp: 16 18 18    Temp: 98 F (36.7 C) (!) 97.5 F (36.4 C) 98.8 F (37.1 C)   TempSrc: Oral Oral Oral   SpO2: 94% 97% 96%   Weight:    83.7 kg  Height:        Intake/Output Summary (Last 24 hours) at 10/30/2020 0916 Last data filed at 10/30/2020 0440 Gross per 24 hour  Intake 1080 ml  Output 1825 ml  Net -745 ml   Filed Weights   10/29/20 0250 10/29/20 0359 10/30/20 0700  Weight: 86 kg 86 kg 83.7 kg    Telemetry    SR - Personally Reviewed  ECG    No new tracings - Personally Reviewed  Physical Exam   GEN: No acute distress.   Neck: JVD elevated to the angle of mandible. Cardiac: RRR, no murmurs, rubs, or gallops.   Respiratory:  Faint bibasilar crackles.  GI: Soft, nontender, non-distended.   MS:  1+ bilateral lower extremity pitting edema to the thighs; No deformity. Neuro:  Alert and oriented x 3; Nonfocal.  Psych: Normal affect.  Labs    Chemistry Recent Labs  Lab 10/28/20 0457 10/29/20 0528 10/30/20 0616  NA 142 136 138  K 3.2* 3.9 4.1  CL 103 101 102  CO2 27 26 26   GLUCOSE 161* 260* 277*  BUN 18 22* 19  CREATININE 0.75 0.73 0.78  CALCIUM 7.9* 7.9* 8.0*  PROT 5.7*  --   --   ALBUMIN 2.2*  --   --   AST 40  --   --   ALT 45*  --   --   ALKPHOS 440*  --   --   BILITOT 1.0  --   --   GFRNONAA >60 >60 >60  ANIONGAP 12 9 10      Hematology Recent Labs  Lab 10/26/20 1600 10/28/20 0457  WBC 5.8 5.8  RBC 4.02* 3.97*  HGB 11.2* 11.3*  HCT 35.9* 35.1*  MCV 89.3 88.4  MCH 27.9 28.5  MCHC 31.2 32.2  RDW 15.9* 15.9*  PLT 227 228  Cardiac EnzymesNo results for input(s): TROPONINI in the last 168 hours. No results for input(s): TROPIPOC in the last 168 hours.   BNP Recent Labs  Lab 10/26/20 1600  BNP 1,396.1*     DDimer No results for input(s): DDIMER in the last 168 hours.   Radiology    No results found.  Cardiac Studies   2D echo 10/27/2020: 1. Left ventricular ejection fraction, by estimation, is <20%. The left  ventricle has severely decreased function. The left ventricle demonstrates  global hypokinesis. The left ventricular internal cavity size was  moderately to severely dilated. Left  ventricular diastolic parameters are consistent with Grade III diastolic  dysfunction (restrictive).  2. Right ventricular systolic function is severely reduced. The right  ventricular size is mildly enlarged. There is moderately elevated  pulmonary artery systolic pressure.  3. Left atrial size was moderately dilated.  4. Right atrial size was moderately dilated.  5. The mitral valve is normal in structure. Moderate mitral valve  regurgitation.  6. The aortic  valve is tricuspid. Aortic valve regurgitation is not  visualized.  7. The inferior vena cava is dilated in size with <50% respiratory  variability, suggesting right atrial pressure of 15 mmHg.   Patient Profile     49 y.o. male with history of HFrEF secondary to NICM status post [employer] ICD in 08/2019, HTN, HLD, and ongoing tobacco use who we're seeing for volume overload.  Assessment & Plan    1.  HFrEF secondary to NICM status post ICD: -He continues to appear volume overloaded -Likely in the setting of medical nonadherence with no recent outpatient follow-up following his move from [place] -Continue IV Lasix 40 mg twice daily, he will likely need several more days of inpatient diuresis -Continue losartan and Toprol XL -Over the next 24 hours look to add spironolactone -In outpatient follow-up consider transitioning losartan to Entresto if this is not precluded from a financial perspective -CHF education -Daily weight -Strict I's and O's -As an outpatient he will need to establish with EP  2.  HTN: -Blood pressure is well controlled -Continue current medications as outlined above  3.  HLD: -Continue Lipitor  For questions or updates, please contact [facility] Please consult [email or web address] for contact info under Cardiology/STEMI.    Signed, Eula Listen, PA-C [facility] Pager: [phone] 10/30/2020, 9:16 AM

## 2020-10-30 NOTE — Progress Notes (Signed)
PROGRESS NOTE    Travis Lucas  OQH:[id number] DOB: September 15, 1971 DOA: 10/26/2020 PCP: Patient, No Pcp Per   Brief Narrative: Taken from prior notes. 49 year old male, homeless, recently moved to [place] from [place] on October 02, 2020, currently residing with his sister and plans to remain in [place], past medical history significant for nonischemic cardiomyopathy, chronic systolic CHF, AICD placed 09/20/2019, type II DM/IDDM, hypertension, hyperlipidemia, CAD, COOP, ongoing tobacco use disorder, THC use disorder, depression, GERD, gastric ulcer, MRSA bacteremia?  Completed antibiotic course, previously on home oxygen but currently not, presented to the ED on 10/26/2020 due to marked lower extremity edema, dyspnea and orthopnea in the context of noncompliance with his medications for more than a month.  He reportedly underwent cardiac cath recently at outside hospital and was told that his "arteries were clean".  Admitted for acute on chronic systolic CHF.  Cardiology was consulted   Subjective: Patient was seen and examined during morning rounds today.  Sitting comfortably at the side of his bed.  No new complaint.  Assessment & Plan:   Principal Problem:   Acute on chronic systolic CHF (congestive heart failure) (HCC) Active Problems:   Insulin-requiring or dependent type II diabetes mellitus (HCC)   BOOP (bronchiolitis obliterans with organizing pneumonia) (HCC)   Hypertensive urgency   Primary hypertension   Smoking  Acute on chronic HFrEF/nonischemic cardiomyopathy s/p AICD. Diuresing well.  Able to lay flat, continue to have significant lower extremity edema.  Repeat echocardiogram with EF of less than 20%. Cardiology was consulted-appreciate their recommendations, they want to continue with IV diuresis as he is still has quite a bit of lower extremity edema. Total of net balance more than 7 L. -Continue with IV Lasix twice daily. -Continue with aspirin, Imdur,  Toprol, Aldactone, Cozaar. -Continue with strict intake and output. -Daily BMP and weight.  Essential hypertension.  Initially admitted with hypertensive urgency.  Blood pressure within goal now. -Patient is on multiple medications. -Continue current management.  Backache. Seems chronic. -Lidocaine patch. -We will avoid any opioids.  Hyperlipidemia. -Continue Lipitor.  Type 2 diabetes mellitus.  CBG with some improvement. -Continue Lantus to 20 units daily. -Continue with SSI.  Polysubstance abuse (tobacco, THC).  UDS positive for cannabinoid. Cessation counseled.  Anemia, suspect of chronic disease Follow CBC.  Cryptogenic organizing pneumonia: Recommend outpatient pulmonology consultation upon discharge.  History of MRSA bacteremia Patient reports completing treatment, partly in [place] and subsequently in [place].  Homeless TOC consulted for assistance  Body mass index is 22.5 kg/m.  Objective: Vitals:   10/30/20 0700 10/30/20 0922 10/30/20 1200 10/30/20 1605  BP:  113/83 (!) 119/97 (!) 121/91  Pulse:  96 96 96  Resp:  17 16 18   Temp:  98.3 F (36.8 C) 98.6 F (37 C) 97.9 F (36.6 C)  TempSrc:  Oral    SpO2:  97% 100% 97%  Weight: 83.7 kg     Height:        Intake/Output Summary (Last 24 hours) at 10/30/2020 1706 Last data filed at 10/30/2020 1605 Gross per 24 hour  Intake 600 ml  Output 3250 ml  Net -2650 ml   Filed Weights   10/29/20 0250 10/29/20 0359 10/30/20 0700  Weight: 86 kg 86 kg 83.7 kg    Examination:  General.  Well-developed gentleman, in no acute distress. Pulmonary.  Lungs clear bilaterally, normal respiratory effort. CV.  Regular rate and rhythm, no JVD, rub or murmur. Abdomen.  Soft, nontender, nondistended, BS positive. CNS.  Alert and oriented x3.  No focal neurologic deficit. Extremities.  2+ LE edema, no cyanosis, pulses intact and symmetrical. Psychiatry.  Judgment and insight appears normal.  DVT  prophylaxis: Lovenox Code Status: Full Family Communication: Discussed with patient Disposition Plan:  Status is: Inpatient  Remains inpatient appropriate because:Inpatient level of care appropriate due to severity of illness   Dispo: The patient is from: Home              Anticipated d/c is to: Home              Anticipated d/c date is: 2 days              Patient currently is not medically stable to d/c.  Patient will need few more days of diuresis.  Consultants:   Cardiology  Procedures:  Antimicrobials:   Data Reviewed: I have personally reviewed following labs and imaging studies  CBC: Recent Labs  Lab 10/26/20 1600 10/28/20 0457  WBC 5.8 5.8  HGB 11.2* 11.3*  HCT 35.9* 35.1*  MCV 89.3 88.4  PLT 227 228   Basic Metabolic Panel: Recent Labs  Lab 10/26/20 1600 10/27/20 0420 10/28/20 0457 10/29/20 0528 10/30/20 0616  NA 136 139 142 136 138  K 4.3 3.7 3.2* 3.9 4.1  CL 105 104 103 101 102  CO2 24 24 27 26 26   GLUCOSE 364* 289* 161* 260* 277*  BUN 22* 20 18 22* 19  CREATININE 0.96 0.95 0.75 0.73 0.78  CALCIUM 7.7* 8.2* 7.9* 7.9* 8.0*   GFR: Estimated Creatinine Clearance: 133.7 mL/min (by C-G formula based on SCr of 0.78 mg/dL). Liver Function Tests: Recent Labs  Lab 10/28/20 0457  AST 40  ALT 45*  ALKPHOS 440*  BILITOT 1.0  PROT 5.7*  ALBUMIN 2.2*   No results for input(s): LIPASE, AMYLASE in the last 168 hours. No results for input(s): AMMONIA in the last 168 hours. Coagulation Profile: No results for input(s): INR, PROTIME in the last 168 hours. Cardiac Enzymes: No results for input(s): CKTOTAL, CKMB, CKMBINDEX, TROPONINI in the last 168 hours. BNP (last 3 results) No results for input(s): PROBNP in the last 8760 hours. HbA1C: No results for input(s): HGBA1C in the last 72 hours. CBG: Recent Labs  Lab 10/29/20 1628 10/29/20 2037 10/30/20 0920 10/30/20 1200 10/30/20 1625  GLUCAP 192* 212* 268* 140* 156*   Lipid Profile: No results  for input(s): CHOL, HDL, LDLCALC, TRIG, CHOLHDL, LDLDIRECT in the last 72 hours. Thyroid Function Tests: No results for input(s): TSH, T4TOTAL, FREET4, T3FREE, THYROIDAB in the last 72 hours. Anemia Panel: No results for input(s): VITAMINB12, FOLATE, FERRITIN, TIBC, IRON, RETICCTPCT in the last 72 hours. Sepsis Labs: No results for input(s): PROCALCITON, LATICACIDVEN in the last 168 hours.  Recent Results (from the past 240 hour(s))  Resp Panel by RT-PCR (Flu A&B, Covid) Nasopharyngeal Swab     Status: None   Collection Time: 10/26/20 11:48 PM   Specimen: Nasopharyngeal Swab; Nasopharyngeal(NP) swabs in vial transport medium  Result Value Ref Range Status   SARS Coronavirus 2 by RT PCR NEGATIVE NEGATIVE Final    Comment: (NOTE) SARS-CoV-2 target nucleic acids are NOT DETECTED.  The SARS-CoV-2 RNA is generally detectable in upper respiratory specimens during the acute phase of infection. The lowest concentration of SARS-CoV-2 viral copies this assay can detect is 138 copies/mL. A negative result does not preclude SARS-Cov-2 infection and should not be used as the sole basis for treatment or other patient management decisions. A negative result  may occur with  improper specimen collection/handling, submission of specimen other than nasopharyngeal swab, presence of viral mutation(s) within the areas targeted by this assay, and inadequate number of viral copies(<138 copies/mL). A negative result must be combined with clinical observations, patient history, and epidemiological information. The expected result is Negative.  Fact Sheet for Patients:  BloggerCourse.com  Fact Sheet for Healthcare Providers:  SeriousBroker.it  This test is no t yet approved or cleared by the [place] FDA and  has been authorized for detection and/or diagnosis of SARS-CoV-2 by FDA under an Emergency Use Authorization (EUA). This EUA will remain  in  effect (meaning this test can be used) for the duration of the COVID-19 declaration under Section 564(b)(1) of the Act, 21 U.S.C.section 360bbb-3(b)(1), unless the authorization is terminated  or revoked sooner.       Influenza A by PCR NEGATIVE NEGATIVE Final   Influenza B by PCR NEGATIVE NEGATIVE Final    Comment: (NOTE) The Xpert Xpress SARS-CoV-2/FLU/RSV plus assay is intended as an aid in the diagnosis of influenza from Nasopharyngeal swab specimens and should not be used as a sole basis for treatment. Nasal washings and aspirates are unacceptable for Xpert Xpress SARS-CoV-2/FLU/RSV testing.  Fact Sheet for Patients: BloggerCourse.com  Fact Sheet for Healthcare Providers: SeriousBroker.it  This test is not yet approved or cleared by the [place] FDA and has been authorized for detection and/or diagnosis of SARS-CoV-2 by FDA under an Emergency Use Authorization (EUA). This EUA will remain in effect (meaning this test can be used) for the duration of the COVID-19 declaration under Section 564(b)(1) of the Act, 21 U.S.C. section 360bbb-3(b)(1), unless the authorization is terminated or revoked.  Performed at [facility], [address] [place], [place] [address]      Radiology Studies: No results found.  Scheduled Meds: . atorvastatin  80 mg Oral Daily  . enoxaparin (LOVENOX) injection  40 mg Subcutaneous Q24H  . furosemide  40 mg Intravenous BID  . insulin aspart  0-15 Units Subcutaneous TID WC  . insulin aspart  0-5 Units Subcutaneous QHS  . insulin glargine  20 Units Subcutaneous Daily  . lidocaine  1 patch Transdermal Q24H  . living well with diabetes book   Does not apply Once  . losartan  25 mg Oral Daily  . metoprolol succinate  50 mg Oral Daily  . sodium chloride flush  3 mL Intravenous Q12H  . spironolactone  25 mg Oral Daily   Continuous Infusions: . sodium chloride       LOS: 3 days    Time spent: 25 minutes  Arnetha Courser, MD [facility]  If 7PM-7AM, please contact night-coverage [email or web address]  10/30/2020, 5:06 PM   This record has been created using Conservation officer, historic buildings. Errors have been sought and corrected,but may not always be located. Such creation errors do not reflect on the standard of care.

## 2020-10-31 LAB — BASIC METABOLIC PANEL
Anion gap: 8 (ref 5–15)
BUN: 19 mg/dL (ref 6–20)
CO2: 28 mmol/L (ref 22–32)
Calcium: 7.8 mg/dL — ABNORMAL LOW (ref 8.9–10.3)
Chloride: 100 mmol/L (ref 98–111)
Creatinine, Ser: 0.73 mg/dL (ref 0.61–1.24)
GFR, Estimated: >60 mL/min (ref >60)
Glucose, Bld: 217 mg/dL — ABNORMAL HIGH (ref 70–99)
Potassium: 3.6 mmol/L (ref 3.5–5.1)
Sodium: 136 mmol/L (ref 135–145)

## 2020-10-31 LAB — GLUCOSE, CAPILLARY
Glucose-Capillary: 248 mg/dL — ABNORMAL HIGH (ref 70–99)
Glucose-Capillary: 246 mg/dL — ABNORMAL HIGH (ref 70–99)
Glucose-Capillary: 160 mg/dL — ABNORMAL HIGH (ref 70–99)

## 2020-10-31 NOTE — Progress Notes (Signed)
Progress Note  Patient Name: Travis Lucas Date of Encounter: 10/31/2020  Primary Cardiologist: Agbor-Etang  Subjective   Dyspnea slightly improved.  Continues to note lower extremity edema.  No chest pain.  Intermittent coughing episodes noted.  Documented urine output of 3.2 mL for the past 24 hours with a net -10.9 L for the admission.  Weight 83.7-->82 kg over the past 24 hours.  Renal function stable.  Inpatient Medications    Scheduled Meds: . atorvastatin  80 mg Oral Daily  . enoxaparin (LOVENOX) injection  40 mg Subcutaneous Q24H  . furosemide  40 mg Intravenous BID  . insulin aspart  0-15 Units Subcutaneous TID WC  . insulin aspart  0-5 Units Subcutaneous QHS  . insulin glargine  20 Units Subcutaneous Daily  . lidocaine  1 patch Transdermal Q24H  . living well with diabetes book   Does not apply Once  . losartan  25 mg Oral Daily  . metoprolol succinate  50 mg Oral Daily  . sodium chloride flush  3 mL Intravenous Q12H  . spironolactone  25 mg Oral Daily   Continuous Infusions: . sodium chloride     PRN Meds: sodium chloride, acetaminophen, labetalol, ondansetron (ZOFRAN) IV, sodium chloride flush   Vital Signs    Vitals:   10/31/20 0058 10/31/20 0104 10/31/20 0500 10/31/20 0624  BP:  123/86  112/84  Pulse:  92  89  Resp:  18  18  Temp:  98.5 F (36.9 C)  98.2 F (36.8 C)  TempSrc:  Oral  Oral  SpO2:  100%  98%  Weight: 82 kg  82 kg   Height:        Intake/Output Summary (Last 24 hours) at 10/31/2020 0737 Last data filed at 10/31/2020 0544 Gross per 24 hour  Intake --  Output 3240 ml  Net -3240 ml   Filed Weights   10/30/20 0700 10/31/20 0058 10/31/20 0500  Weight: 83.7 kg 82 kg 82 kg    Telemetry    Not on telemetry - Personally Reviewed  ECG    No new tracings - Personally Reviewed  Physical Exam   GEN: No acute distress.   Neck: JVD elevated to the angle of mandible. Cardiac: RRR, no murmurs, rubs, or gallops.  Respiratory:  Faint  crackles along left base.  GI: Soft, nontender, non-distended.   MS:  1+ bilateral lower extremity pitting edema to the thighs; No deformity. Neuro:  Alert and oriented x 3; Nonfocal.  Psych: Normal affect.  Labs    Chemistry Recent Labs  Lab 10/28/20 0457 10/28/20 0457 10/29/20 0528 10/30/20 0616 10/31/20 0635  NA 142   < > 136 138 136  K 3.2*   < > 3.9 4.1 3.6  CL 103   < > 101 102 100  CO2 27   < > 26 26 28   GLUCOSE 161*   < > 260* 277* 217*  BUN 18   < > 22* 19 19  CREATININE 0.75   < > 0.73 0.78 0.73  CALCIUM 7.9*   < > 7.9* 8.0* 7.8*  PROT 5.7*  --   --   --   --   ALBUMIN 2.2*  --   --   --   --   AST 40  --   --   --   --   ALT 45*  --   --   --   --   ALKPHOS 440*  --   --   --   --  BILITOT 1.0  --   --   --   --   GFRNONAA >60   < > >60 >60 >60  ANIONGAP 12   < > 9 10 8    < > = values in this interval not displayed.     Hematology Recent Labs  Lab 10/26/20 1600 10/28/20 0457  WBC 5.8 5.8  RBC 4.02* 3.97*  HGB 11.2* 11.3*  HCT 35.9* 35.1*  MCV 89.3 88.4  MCH 27.9 28.5  MCHC 31.2 32.2  RDW 15.9* 15.9*  PLT 227 228    Cardiac EnzymesNo results for input(s): TROPONINI in the last 168 hours. No results for input(s): TROPIPOC in the last 168 hours.   BNP Recent Labs  Lab 10/26/20 1600  BNP 1,396.1*     DDimer No results for input(s): DDIMER in the last 168 hours.   Radiology    No results found.  Cardiac Studies   2D echo 10/27/2020: 1. Left ventricular ejection fraction, by estimation, is <20%. The left  ventricle has severely decreased function. The left ventricle demonstrates  global hypokinesis. The left ventricular internal cavity size was  moderately to severely dilated. Left  ventricular diastolic parameters are consistent with Grade III diastolic  dysfunction (restrictive).  2. Right ventricular systolic function is severely reduced. The right  ventricular size is mildly enlarged. There is moderately elevated  pulmonary  artery systolic pressure.  3. Left atrial size was moderately dilated.  4. Right atrial size was moderately dilated.  5. The mitral valve is normal in structure. Moderate mitral valve  regurgitation.  6. The aortic valve is tricuspid. Aortic valve regurgitation is not  visualized.  7. The inferior vena cava is dilated in size with <50% respiratory  variability, suggesting right atrial pressure of 15 mmHg.   Patient Profile     49 y.o. male with history of HFrEF secondary to NICM status post [employer] ICD in 08/2019, HTN, HLD, and ongoing tobacco use who we're seeing for volume overload.  Assessment & Plan    1.  HFrEF secondary to NICM status post ICD: -Volume status is improving, though he continues to appear volume overloaded -Likely in the setting of medical nonadherence with no recent outpatient follow-up following his move from 09/2019 -Continue IV Lasix 40 mg twice daily until there are signs of intravascular volume depletion, he will likely need several more days of inpatient diuresis -Continue losartan, Toprol XL, and spironolactone -In outpatient follow-up consider transitioning losartan to Entresto if this is not precluded from a financial perspective -CHF education -Daily weight -Strict I's and O's -As an outpatient he will need to establish with EP  2.  HTN: -Blood pressure is well controlled -Continue current medications as outlined above  3.  HLD: -Continue Lipitor  For questions or updates, please contact [facility] Please consult [email or web address] for contact info under Cardiology/STEMI.    Signed, [place], PA-C [facility] [place] [facility] Pager: [phone] 10/31/2020, 7:37 AM

## 2020-10-31 NOTE — Progress Notes (Signed)
PROGRESS NOTE    Travis Lucas  HKV:[id number] DOB: 28-Apr-1971 DOA: 10/26/2020 PCP: Patient, No Pcp Per   Brief Narrative: Taken from prior notes. 49 year old male, homeless, recently moved to [place] from [place] on October 02, 2020, currently residing with his sister and plans to remain in [place], past medical history significant for nonischemic cardiomyopathy, chronic systolic CHF, AICD placed 09/20/2019, type II DM/IDDM, hypertension, hyperlipidemia, CAD, COOP, ongoing tobacco use disorder, THC use disorder, depression, GERD, gastric ulcer, MRSA bacteremia?  Completed antibiotic course, previously on home oxygen but currently not, presented to the ED on 10/26/2020 due to marked lower extremity edema, dyspnea and orthopnea in the context of noncompliance with his medications for more than a month.  He reportedly underwent cardiac cath recently at outside hospital and was told that his "arteries were clean".  Admitted for acute on chronic systolic CHF.  Cardiology was consulted. Patient with EF of less than 20%.  Cardiology wants to continue IV diuresis till reaches euvolemia or increase in his creatinine.  Subjective: Patient with no new complaint.  Continue to diurese well, still quite significant lower extremity edema.  Assessment & Plan:   Principal Problem:   Acute on chronic systolic CHF (congestive heart failure) (HCC) Active Problems:   Insulin-requiring or dependent type II diabetes mellitus (HCC)   BOOP (bronchiolitis obliterans with organizing pneumonia) (HCC)   Hypertensive urgency   Primary hypertension   Smoking  Acute on chronic HFrEF/nonischemic cardiomyopathy s/p AICD. Diuresing well.  Able to lay flat, continue to have significant lower extremity edema.  Repeat echocardiogram with EF of less than 20%. Cardiology was consulted-appreciate their recommendations, they want to continue with IV diuresis as he is still has quite a bit of lower extremity  edema. Total of net balance more than 10 L. -Continue with IV Lasix twice daily. -Continue with aspirin, Imdur, Toprol, Aldactone, Cozaar. -Continue with strict intake and output. -Daily BMP and weight.  Essential hypertension.  Initially admitted with hypertensive urgency.  Blood pressure within goal now. -Patient is on multiple medications. -Continue current management.  Backache. Seems chronic. -Lidocaine patch. -We will avoid any opioids.  Hyperlipidemia. -Continue Lipitor.  Type 2 diabetes mellitus.  CBG elevated -Increase Lantus to 15 units twice daily. -Add 4 units with meals -Continue with SSI.  Polysubstance abuse (tobacco, THC).  UDS positive for cannabinoid. Cessation counseled.  Anemia, suspect of chronic disease Follow CBC.  Cryptogenic organizing pneumonia: Recommend outpatient pulmonology consultation upon discharge.  History of MRSA bacteremia Patient reports completing treatment, partly in [place] and subsequently in [place].  Homeless TOC consulted for assistance  Body mass index is 22.5 kg/m.  Objective: Vitals:   10/31/20 0500 10/31/20 0624 10/31/20 0745 10/31/20 1202  BP:  112/84 116/84 116/90  Pulse:  89 91 93  Resp:  18 16 16   Temp:  98.2 F (36.8 C) 98.6 F (37 C) 98.6 F (37 C)  TempSrc:  Oral Oral   SpO2:  98% 100% (!) 88%  Weight: 82 kg     Height:        Intake/Output Summary (Last 24 hours) at 10/31/2020 1356 Last data filed at 10/31/2020 0544 Gross per 24 hour  Intake --  Output 2315 ml  Net -2315 ml   Filed Weights   10/30/20 0700 10/31/20 0058 10/31/20 0500  Weight: 83.7 kg 82 kg 82 kg    Examination:  General.  Gentleman, in no acute distress. Pulmonary.  Lungs clear bilaterally, normal respiratory effort. CV.  Regular  rate and rhythm, no JVD, rub or murmur. Abdomen.  Soft, nontender, nondistended, BS positive. CNS.  Alert and oriented x3.  No focal neurologic deficit. Extremities.  2+ LE edema,  no cyanosis, pulses intact and symmetrical. Psychiatry.  Judgment and insight appears normal.  DVT prophylaxis: Lovenox Code Status: Full Family Communication: Discussed with patient Disposition Plan:  Status is: Inpatient  Remains inpatient appropriate because:Inpatient level of care appropriate due to severity of illness   Dispo: The patient is from: Home              Anticipated d/c is to: Home              Anticipated d/c date is: 2 days              Patient currently is not medically stable to d/c.  Patient will need few more days of diuresis.  Patient would also need medical assistance on discharge, TOC is aware.  Consultants:   Cardiology  Procedures:  Antimicrobials:   Data Reviewed: I have personally reviewed following labs and imaging studies  CBC: Recent Labs  Lab 10/26/20 1600 10/28/20 0457  WBC 5.8 5.8  HGB 11.2* 11.3*  HCT 35.9* 35.1*  MCV 89.3 88.4  PLT 227 228   Basic Metabolic Panel: Recent Labs  Lab 10/27/20 0420 10/28/20 0457 10/29/20 0528 10/30/20 0616 10/31/20 0635  NA 139 142 136 138 136  K 3.7 3.2* 3.9 4.1 3.6  CL 104 103 101 102 100  CO2 24 27 26 26 28   GLUCOSE 289* 161* 260* 277* 217*  BUN 20 18 22* 19 19  CREATININE 0.95 0.75 0.73 0.78 0.73  CALCIUM 8.2* 7.9* 7.9* 8.0* 7.8*   GFR: Estimated Creatinine Clearance: 131 mL/min (by C-G formula based on SCr of 0.73 mg/dL). Liver Function Tests: Recent Labs  Lab 10/28/20 0457  AST 40  ALT 45*  ALKPHOS 440*  BILITOT 1.0  PROT 5.7*  ALBUMIN 2.2*   No results for input(s): LIPASE, AMYLASE in the last 168 hours. No results for input(s): AMMONIA in the last 168 hours. Coagulation Profile: No results for input(s): INR, PROTIME in the last 168 hours. Cardiac Enzymes: No results for input(s): CKTOTAL, CKMB, CKMBINDEX, TROPONINI in the last 168 hours. BNP (last 3 results) No results for input(s): PROBNP in the last 8760 hours. HbA1C: No results for input(s): HGBA1C in the last 72  hours. CBG: Recent Labs  Lab 10/30/20 1200 10/30/20 1625 10/30/20 2022 10/31/20 0744 10/31/20 1201  GLUCAP 140* 156* 233* 248* 246*   Lipid Profile: No results for input(s): CHOL, HDL, LDLCALC, TRIG, CHOLHDL, LDLDIRECT in the last 72 hours. Thyroid Function Tests: No results for input(s): TSH, T4TOTAL, FREET4, T3FREE, THYROIDAB in the last 72 hours. Anemia Panel: No results for input(s): VITAMINB12, FOLATE, FERRITIN, TIBC, IRON, RETICCTPCT in the last 72 hours. Sepsis Labs: No results for input(s): PROCALCITON, LATICACIDVEN in the last 168 hours.  Recent Results (from the past 240 hour(s))  Resp Panel by RT-PCR (Flu A&B, Covid) Nasopharyngeal Swab     Status: None   Collection Time: 10/26/20 11:48 PM   Specimen: Nasopharyngeal Swab; Nasopharyngeal(NP) swabs in vial transport medium  Result Value Ref Range Status   SARS Coronavirus 2 by RT PCR NEGATIVE NEGATIVE Final    Comment: (NOTE) SARS-CoV-2 target nucleic acids are NOT DETECTED.  The SARS-CoV-2 RNA is generally detectable in upper respiratory specimens during the acute phase of infection. The lowest concentration of SARS-CoV-2 viral copies this assay can detect is  138 copies/mL. A negative result does not preclude SARS-Cov-2 infection and should not be used as the sole basis for treatment or other patient management decisions. A negative result may occur with  improper specimen collection/handling, submission of specimen other than nasopharyngeal swab, presence of viral mutation(s) within the areas targeted by this assay, and inadequate number of viral copies(<138 copies/mL). A negative result must be combined with clinical observations, patient history, and epidemiological information. The expected result is Negative.  Fact Sheet for Patients:  BloggerCourse.com  Fact Sheet for Healthcare Providers:  SeriousBroker.it  This test is no t yet approved or cleared by  the [place] FDA and  has been authorized for detection and/or diagnosis of SARS-CoV-2 by FDA under an Emergency Use Authorization (EUA). This EUA will remain  in effect (meaning this test can be used) for the duration of the COVID-19 declaration under Section 564(b)(1) of the Act, 21 U.S.C.section 360bbb-3(b)(1), unless the authorization is terminated  or revoked sooner.       Influenza A by PCR NEGATIVE NEGATIVE Final   Influenza B by PCR NEGATIVE NEGATIVE Final    Comment: (NOTE) The Xpert Xpress SARS-CoV-2/FLU/RSV plus assay is intended as an aid in the diagnosis of influenza from Nasopharyngeal swab specimens and should not be used as a sole basis for treatment. Nasal washings and aspirates are unacceptable for Xpert Xpress SARS-CoV-2/FLU/RSV testing.  Fact Sheet for Patients: BloggerCourse.com  Fact Sheet for Healthcare Providers: SeriousBroker.it  This test is not yet approved or cleared by the [place] FDA and has been authorized for detection and/or diagnosis of SARS-CoV-2 by FDA under an Emergency Use Authorization (EUA). This EUA will remain in effect (meaning this test can be used) for the duration of the COVID-19 declaration under Section 564(b)(1) of the Act, 21 U.S.C. section 360bbb-3(b)(1), unless the authorization is terminated or revoked.  Performed at [facility], [address], [place], [place] [address]      Radiology Studies: No results found.  Scheduled Meds: . atorvastatin  80 mg Oral Daily  . enoxaparin (LOVENOX) injection  40 mg Subcutaneous Q24H  . furosemide  40 mg Intravenous BID  . insulin aspart  0-15 Units Subcutaneous TID WC  . insulin aspart  0-5 Units Subcutaneous QHS  . insulin glargine  20 Units Subcutaneous Daily  . lidocaine  1 patch Transdermal Q24H  . living well with diabetes book   Does not apply Once  . losartan  25 mg Oral Daily  . metoprolol succinate   50 mg Oral Daily  . sodium chloride flush  3 mL Intravenous Q12H  . spironolactone  25 mg Oral Daily   Continuous Infusions: . sodium chloride       LOS: 4 days   Time spent: 25 minutes  Arnetha Courser, MD [facility]  If 7PM-7AM, please contact night-coverage [email or web address]  10/31/2020, 1:56 PM   This record has been created using Conservation officer, historic buildings. Errors have been sought and corrected,but may not always be located. Such creation errors do not reflect on the standard of care.

## 2020-10-31 NOTE — Plan of Care (Signed)
  Problem: Education: Goal: Ability to demonstrate management of disease process will improve Outcome: Progressing   Problem: Activity: Goal: Capacity to carry out activities will improve Outcome: Progressing   Problem: Education: Goal: Knowledge of General Education information will improve Description: Including pain rating scale, medication(s)/side effects and non-pharmacologic comfort measures Outcome: Progressing   Problem: Education: Goal: Ability to describe self-care measures that may prevent or decrease complications (Diabetes Survival Skills Education) will improve Outcome: Progressing

## 2020-10-31 NOTE — Progress Notes (Signed)
Report received from [facility] in 2A.  Pt to transfer to 1C bed 111.

## 2020-10-31 NOTE — TOC Progression Note (Addendum)
Transition of Care [facility]) - Progression Note    Patient Details  Name: Travis Lucas MRN: [id number] Date of Birth: 12-29-1970  [facility]) CM/SW Contact  Bing Quarry, RN Phone Number: 10/31/2020, 10:58 AM  Clinical Narrative:   12/4: 1430 Patient noted to be transferred to Rm 111A.   12/4 Still in volume overload per cardiologist note 12/4. Anticipated discharge on 12/3 was 12/5 or 12/6. HF RN Navigator saw patient on 12/3 and discussed diet, [place] of compression hose, dietary changes patient needs to make, fluid restriction, sodium restrictions, weights, how many pounds weight gain to monitor for. No PT/OT evaluation ordered at this time. Will continue to monitor. Gabriel Cirri RN CM         Expected Discharge Plan and Services                                                 Social Determinants of Health (SDOH) Interventions    Readmission Risk Interventions No flowsheet data found.

## 2020-11-01 LAB — BASIC METABOLIC PANEL
Anion gap: 9 (ref 5–15)
BUN: 18 mg/dL (ref 6–20)
CO2: 28 mmol/L (ref 22–32)
Calcium: 8 mg/dL — ABNORMAL LOW (ref 8.9–10.3)
Chloride: 101 mmol/L (ref 98–111)
Creatinine, Ser: 0.79 mg/dL (ref 0.61–1.24)
GFR, Estimated: >60 mL/min (ref >60)
Glucose, Bld: 235 mg/dL — ABNORMAL HIGH (ref 70–99)
Potassium: 3.8 mmol/L (ref 3.5–5.1)
Sodium: 138 mmol/L (ref 135–145)

## 2020-11-01 LAB — GLUCOSE, CAPILLARY
Glucose-Capillary: 156 mg/dL — ABNORMAL HIGH (ref 70–99)
Glucose-Capillary: 175 mg/dL — ABNORMAL HIGH (ref 70–99)
Glucose-Capillary: 209 mg/dL — ABNORMAL HIGH (ref 70–99)
Glucose-Capillary: 224 mg/dL — ABNORMAL HIGH (ref 70–99)
Glucose-Capillary: 238 mg/dL — ABNORMAL HIGH (ref 70–99)

## 2020-11-01 MED ORDER — FUROSEMIDE 10 MG/ML IJ SOLN
60.0000 mg | Freq: Two times a day (BID) | INTRAMUSCULAR | Status: DC
  Administered 2020-11-01: 60 mg via INTRAVENOUS
  Filled 2020-11-01: qty 6

## 2020-11-01 MED ORDER — FUROSEMIDE 40 MG PO TABS
40.0000 mg | ORAL_TABLET | Freq: Two times a day (BID) | ORAL | Status: DC
  Administered 2020-11-01 – 2020-11-02 (×2): 40 mg via ORAL
  Filled 2020-11-01 (×2): qty 1

## 2020-11-01 NOTE — Progress Notes (Signed)
PROGRESS NOTE    Travis Lucas  FMB:[id number] DOB: [birth year] DOA: 10/26/2020 PCP: Patient, No Pcp Per   Brief Narrative: Taken from prior notes. 49 year old male, homeless, recently moved to [place] from [place] on October 02, 2020, currently residing with his sister and plans to remain in [place], past medical history significant for nonischemic cardiomyopathy, chronic systolic CHF, AICD placed 09/20/2019, type II DM/IDDM, hypertension, hyperlipidemia, CAD, COOP, ongoing tobacco use disorder, THC use disorder, depression, GERD, gastric ulcer, MRSA bacteremia?  Completed antibiotic course, previously on home oxygen but currently not, presented to the ED on 10/26/2020 due to marked lower extremity edema, dyspnea and orthopnea in the context of noncompliance with his medications for more than a month.  He reportedly underwent cardiac cath recently at outside hospital and was told that his "arteries were clean".  Admitted for acute on chronic systolic CHF.  Cardiology was consulted. Patient with EF of less than 20%.  Cardiology wants to continue IV diuresis till reaches euvolemia or increase in his creatinine.  Subjective: Patient was eating breakfast when seen today.  Denies any chest pain or shortness of breath.  Assessment & Plan:   Principal Problem:   Acute on chronic systolic CHF (congestive heart failure) (HCC) Active Problems:   Insulin-requiring or dependent type II diabetes mellitus (HCC)   BOOP (bronchiolitis obliterans with organizing pneumonia) (HCC)   Hypertensive urgency   Primary hypertension   Smoking  Acute on chronic HFrEF/nonischemic cardiomyopathy s/p AICD. Diuresing well.  Able to lay flat, continue to have significant lower extremity edema.  Repeat echocardiogram with EF of less than 20%. Cardiology was consulted-appreciate their recommendations. Total of net balance more than 10 L. -He will be transitioned to p.o. Lasix later today. -Continue with  aspirin, Imdur, Toprol, Aldactone, Cozaar. -Continue with strict intake and output. -Daily BMP and weight.  Essential hypertension.  Initially admitted with hypertensive urgency.  Blood pressure within goal now. -Patient is on multiple medications. -Continue current management.  Backache. Seems chronic. -Lidocaine patch. -We will avoid any opioids.  Hyperlipidemia. -Continue Lipitor.  Type 2 diabetes mellitus.  CBG elevated -Increase Lantus to 15 units twice daily. -Add 4 units with meals -Continue with SSI.  Polysubstance abuse (tobacco, THC).  UDS positive for cannabinoid. Cessation counseled.  Anemia, suspect of chronic disease Follow CBC.  Cryptogenic organizing pneumonia: Recommend outpatient pulmonology consultation upon discharge.  History of MRSA bacteremia Patient reports completing treatment, partly in [place] and subsequently in [place].  Homeless TOC consulted for assistance  Body mass index is 22.5 kg/m.  Objective: Vitals:   11/01/20 0510 11/01/20 0658 11/01/20 0802 11/01/20 1143  BP:   113/87 107/87  Pulse:   96 96  Resp:   18 18  Temp: 98.2 F (36.8 C)  98.3 F (36.8 C) 98.4 F (36.9 C)  TempSrc:      SpO2:   100% 96%  Weight:  81.7 kg    Height:        Intake/Output Summary (Last 24 hours) at 11/01/2020 1318 Last data filed at 11/01/2020 1100 Gross per 24 hour  Intake 360 ml  Output 2050 ml  Net -1690 ml   Filed Weights   10/31/20 0500 11/01/20 0500 11/01/20 0658  Weight: 82 kg 82 kg 81.7 kg    Examination:  General.  Well-developed gentleman, in no acute distress. Pulmonary.  Lungs clear bilaterally, normal respiratory effort. CV.  Regular rate and rhythm, no JVD, rub or murmur. Abdomen.  Soft, nontender, nondistended, BS positive.  CNS.  Alert and oriented x3.  No focal neurologic deficit. Extremities.  1+LE edema, no cyanosis, pulses intact and symmetrical. Psychiatry.  Judgment and insight appears normal.  DVT  prophylaxis: Lovenox Code Status: Full Family Communication: Discussed with patient Disposition Plan:  Status is: Inpatient  Remains inpatient appropriate because:Inpatient level of care appropriate due to severity of illness   Dispo: The patient is from: Home              Anticipated d/c is to: Home              Anticipated d/c date is: 1 day.              Patient currently is not medically stable to d/c.  Patient can be discharged home tomorrow after arranging medications with medical management with outpatient follow-up at heart failure clinic.  Consultants:   Cardiology  Procedures:  Antimicrobials:   Data Reviewed: I have personally reviewed following labs and imaging studies  CBC: Recent Labs  Lab 10/26/20 1600 10/28/20 0457  WBC 5.8 5.8  HGB 11.2* 11.3*  HCT 35.9* 35.1*  MCV 89.3 88.4  PLT 227 228   Basic Metabolic Panel: Recent Labs  Lab 10/28/20 0457 10/29/20 0528 10/30/20 0616 10/31/20 0635 11/01/20 0450  NA 142 136 138 136 138  K 3.2* 3.9 4.1 3.6 3.8  CL 103 101 102 100 101  CO2 27 26 26 28 28   GLUCOSE 161* 260* 277* 217* 235*  BUN 18 22* 19 19 18   CREATININE 0.75 0.73 0.78 0.73 0.79  CALCIUM 7.9* 7.9* 8.0* 7.8* 8.0*   GFR: Estimated Creatinine Clearance: 130.5 mL/min (by C-G formula based on SCr of 0.79 mg/dL). Liver Function Tests: Recent Labs  Lab 10/28/20 0457  AST 40  ALT 45*  ALKPHOS 440*  BILITOT 1.0  PROT 5.7*  ALBUMIN 2.2*   No results for input(s): LIPASE, AMYLASE in the last 168 hours. No results for input(s): AMMONIA in the last 168 hours. Coagulation Profile: No results for input(s): INR, PROTIME in the last 168 hours. Cardiac Enzymes: No results for input(s): CKTOTAL, CKMB, CKMBINDEX, TROPONINI in the last 168 hours. BNP (last 3 results) No results for input(s): PROBNP in the last 8760 hours. HbA1C: No results for input(s): HGBA1C in the last 72 hours. CBG: Recent Labs  Lab 10/31/20 1201 10/31/20 1653  11/01/20 0043 11/01/20 0801 11/01/20 1222  GLUCAP 246* 160* 238* 156* 209*   Lipid Profile: No results for input(s): CHOL, HDL, LDLCALC, TRIG, CHOLHDL, LDLDIRECT in the last 72 hours. Thyroid Function Tests: No results for input(s): TSH, T4TOTAL, FREET4, T3FREE, THYROIDAB in the last 72 hours. Anemia Panel: No results for input(s): VITAMINB12, FOLATE, FERRITIN, TIBC, IRON, RETICCTPCT in the last 72 hours. Sepsis Labs: No results for input(s): PROCALCITON, LATICACIDVEN in the last 168 hours.  Recent Results (from the past 240 hour(s))  Resp Panel by RT-PCR (Flu A&B, Covid) Nasopharyngeal Swab     Status: None   Collection Time: 10/26/20 11:48 PM   Specimen: Nasopharyngeal Swab; Nasopharyngeal(NP) swabs in vial transport medium  Result Value Ref Range Status   SARS Coronavirus 2 by RT PCR NEGATIVE NEGATIVE Final    Comment: (NOTE) SARS-CoV-2 target nucleic acids are NOT DETECTED.  The SARS-CoV-2 RNA is generally detectable in upper respiratory specimens during the acute phase of infection. The lowest concentration of SARS-CoV-2 viral copies this assay can detect is 138 copies/mL. A negative result does not preclude SARS-Cov-2 infection and should not be used as the  sole basis for treatment or other patient management decisions. A negative result may occur with  improper specimen collection/handling, submission of specimen other than nasopharyngeal swab, presence of viral mutation(s) within the areas targeted by this assay, and inadequate number of viral copies(<138 copies/mL). A negative result must be combined with clinical observations, patient history, and epidemiological information. The expected result is Negative.  Fact Sheet for Patients:  BloggerCourse.com  Fact Sheet for Healthcare Providers:  SeriousBroker.it  This test is no t yet approved or cleared by the [place] FDA and  has been authorized for detection  and/or diagnosis of SARS-CoV-2 by FDA under an Emergency Use Authorization (EUA). This EUA will remain  in effect (meaning this test can be used) for the duration of the COVID-19 declaration under Section 564(b)(1) of the Act, 21 U.S.C.section 360bbb-3(b)(1), unless the authorization is terminated  or revoked sooner.       Influenza A by PCR NEGATIVE NEGATIVE Final   Influenza B by PCR NEGATIVE NEGATIVE Final    Comment: (NOTE) The Xpert Xpress SARS-CoV-2/FLU/RSV plus assay is intended as an aid in the diagnosis of influenza from Nasopharyngeal swab specimens and should not be used as a sole basis for treatment. Nasal washings and aspirates are unacceptable for Xpert Xpress SARS-CoV-2/FLU/RSV testing.  Fact Sheet for Patients: BloggerCourse.com  Fact Sheet for Healthcare Providers: SeriousBroker.it  This test is not yet approved or cleared by the [place] FDA and has been authorized for detection and/or diagnosis of SARS-CoV-2 by FDA under an Emergency Use Authorization (EUA). This EUA will remain in effect (meaning this test can be used) for the duration of the COVID-19 declaration under Section 564(b)(1) of the Act, 21 U.S.C. section 360bbb-3(b)(1), unless the authorization is terminated or revoked.  Performed at [facility], [address] [place], [place] [address]      Radiology Studies: No results found.  Scheduled Meds: . atorvastatin  80 mg Oral Daily  . enoxaparin (LOVENOX) injection  40 mg Subcutaneous Q24H  . furosemide  40 mg Oral BID  . insulin aspart  0-15 Units Subcutaneous TID WC  . insulin aspart  0-5 Units Subcutaneous QHS  . insulin glargine  20 Units Subcutaneous Daily  . lidocaine  1 patch Transdermal Q24H  . living well with diabetes book   Does not apply Once  . losartan  25 mg Oral Daily  . metoprolol succinate  50 mg Oral Daily  . sodium chloride flush  3 mL Intravenous Q12H   . spironolactone  25 mg Oral Daily   Continuous Infusions: . sodium chloride       LOS: 5 days   Time spent: 25 minutes  Arnetha Courser, MD [facility]  If 7PM-7AM, please contact night-coverage [email or web address]  11/01/2020, 1:18 PM   This record has been created using Conservation officer, historic buildings. Errors have been sought and corrected,but may not always be located. Such creation errors do not reflect on the standard of care.

## 2020-11-01 NOTE — Progress Notes (Signed)
Progress Note  Patient Name: Travis Lucas Date of Encounter: 11/01/2020  [facility] Cardiologist:  [place] rounding   Subjective   Doing okay, shortness of breath much better than yesterday.  Inpatient Medications    Scheduled Meds: . atorvastatin  80 mg Oral Daily  . enoxaparin (LOVENOX) injection  40 mg Subcutaneous Q24H  . furosemide  40 mg Oral BID  . insulin aspart  0-15 Units Subcutaneous TID WC  . insulin aspart  0-5 Units Subcutaneous QHS  . insulin glargine  20 Units Subcutaneous Daily  . lidocaine  1 patch Transdermal Q24H  . living well with diabetes book   Does not apply Once  . losartan  25 mg Oral Daily  . metoprolol succinate  50 mg Oral Daily  . sodium chloride flush  3 mL Intravenous Q12H  . spironolactone  25 mg Oral Daily   Continuous Infusions: . sodium chloride     PRN Meds: sodium chloride, acetaminophen, labetalol, ondansetron (ZOFRAN) IV, sodium chloride flush   Vital Signs    Vitals:   11/01/20 0510 11/01/20 0658 11/01/20 0802 11/01/20 1143  BP:   113/87 107/87  Pulse:   96 96  Resp:   18 18  Temp: 98.2 F (36.8 C)  98.3 F (36.8 C) 98.4 F (36.9 C)  TempSrc:      SpO2:   100% 96%  Weight:  81.7 kg    Height:        Intake/Output Summary (Last 24 hours) at 11/01/2020 1210 Last data filed at 11/01/2020 1044 Gross per 24 hour  Intake 360 ml  Output 1550 ml  Net -1190 ml   Last 3 Weights 11/01/2020 11/01/2020 10/31/2020  Weight (lbs) 180 lb 1.9 oz 180 lb 12.4 oz 180 lb 12.4 oz  Weight (kg) 81.7 kg 82 kg 82 kg      Telemetry    Sinus rhythm- Personally Reviewed  ECG    New EKG obtained- Personally Reviewed  Physical Exam   GEN: Well nourished, no acute distress HEENT: Normal LYMPHATICS: No lymphadenopathy CARDIAC: RRR, no murmurs, rubs, gallops RESPIRATORY: Mild crackles at bases ABDOMEN: Soft, non-tender, MUSCULOSKELETAL: Trace edema; No deformity  SKIN: Warm and dry NEUROLOGIC: Alert, oriented   Labs     High Sensitivity Troponin:   Recent Labs  Lab 10/26/20 1600  TROPONINIHS 20*      Chemistry Recent Labs  Lab 10/28/20 0457 10/29/20 0528 10/30/20 0616 10/31/20 0635 11/01/20 0450  NA 142   < > 138 136 138  K 3.2*   < > 4.1 3.6 3.8  CL 103   < > 102 100 101  CO2 27   < > 26 28 28   GLUCOSE 161*   < > 277* 217* 235*  BUN 18   < > 19 19 18   CREATININE 0.75   < > 0.78 0.73 0.79  CALCIUM 7.9*   < > 8.0* 7.8* 8.0*  PROT 5.7*  --   --   --   --   ALBUMIN 2.2*  --   --   --   --   AST 40  --   --   --   --   ALT 45*  --   --   --   --   ALKPHOS 440*  --   --   --   --   BILITOT 1.0  --   --   --   --   GFRNONAA >60   < > >60 >  60 >60  ANIONGAP 12   < > 10 8 9    < > = values in this interval not displayed.     Hematology Recent Labs  Lab 10/26/20 1600 10/28/20 0457  WBC 5.8 5.8  RBC 4.02* 3.97*  HGB 11.2* 11.3*  HCT 35.9* 35.1*  MCV 89.3 88.4  MCH 27.9 28.5  MCHC 31.2 32.2  RDW 15.9* 15.9*  PLT 227 228    BNP Recent Labs  Lab 10/26/20 1600  BNP 1,396.1*     DDimer No results for input(s): DDIMER in the last 168 hours.   Radiology    No results found.  Cardiac Studies   echo 10/27/2020: 1. Left ventricular ejection fraction, by estimation, is <20%. The left  ventricle has severely decreased function. The left ventricle demonstrates  global hypokinesis. The left ventricular internal cavity size was  moderately to severely dilated. Left  ventricular diastolic parameters are consistent with Grade III diastolic  dysfunction (restrictive).  2. Right ventricular systolic function is severely reduced. The right  ventricular size is mildly enlarged. There is moderately elevated  pulmonary artery systolic pressure.  3. Left atrial size was moderately dilated.  4. Right atrial size was moderately dilated.  5. The mitral valve is normal in structure. Moderate mitral valve  regurgitation.  6. The aortic valve is tricuspid. Aortic valve regurgitation is  not  visualized.  7. The inferior vena cava is dilated in size with <50% respiratory  variability, suggesting right atrial pressure of 15 mmHg.  Patient Profile     49 y.o. male  with history of nonischemic cardiomyopathy,EF <20% s/p AICD 08/2019 [facility]), hypertension, hyperlipidemia, current smoker presenting with shortness of breath and extremity swelling being seen for congestive heart failure exacerbation.  Assessment & Plan    1. NICM s/p ICD, EF <20% -Appears euvolemic.  Walking, eating without shortness of breath. -Stop IV Lasix, start p.o. Lasix 40 mg twice daily -Toprol-XL, losartan, Aldactone -He will need good dispo planning, medication assistance program on discharge as he is high risk for readmission.  2.  Hypertension -BP controlled -Toprol-XL, losartan , aldactone  3.  Hyperlipidemia -PTA Lipitor  Greater than 50% was spent in counseling and coordination of care with patient Total encounter time 35 minutes       Signed, HOSP DR. CAYETANO COLL Y TOSTE, MD  11/01/2020, 12:10 PM

## 2020-11-02 ENCOUNTER — Other Ambulatory Visit: Payer: Self-pay | Admitting: Internal Medicine

## 2020-11-02 LAB — BASIC METABOLIC PANEL
Anion gap: 7 (ref 5–15)
BUN: 18 mg/dL (ref 6–20)
CO2: 28 mmol/L (ref 22–32)
Calcium: 7.9 mg/dL — ABNORMAL LOW (ref 8.9–10.3)
Chloride: 102 mmol/L (ref 98–111)
Creatinine, Ser: 0.91 mg/dL (ref 0.61–1.24)
GFR, Estimated: >60 mL/min (ref >60)
Glucose, Bld: 184 mg/dL — ABNORMAL HIGH (ref 70–99)
Potassium: 4 mmol/L (ref 3.5–5.1)
Sodium: 137 mmol/L (ref 135–145)

## 2020-11-02 LAB — GLUCOSE, CAPILLARY
Glucose-Capillary: 355 mg/dL — ABNORMAL HIGH (ref 70–99)
Glucose-Capillary: 213 mg/dL — ABNORMAL HIGH (ref 70–99)

## 2020-11-02 MED ORDER — LIDOCAINE 5 % EX PTCH
1.0000 | MEDICATED_PATCH | CUTANEOUS | 0 refills | Status: DC
Start: 2020-11-02 — End: 2021-02-10

## 2020-11-02 MED ORDER — FUROSEMIDE 40 MG PO TABS
40.0000 mg | ORAL_TABLET | Freq: Two times a day (BID) | ORAL | 2 refills | Status: DC
Start: 2020-11-02 — End: 2021-01-15

## 2020-11-02 MED ORDER — METOPROLOL SUCCINATE ER 50 MG PO TB24
50.0000 mg | ORAL_TABLET | Freq: Every day | ORAL | 1 refills | Status: DC
Start: 2020-11-02 — End: 2020-12-11

## 2020-11-02 MED ORDER — ATORVASTATIN CALCIUM 80 MG PO TABS
80.0000 mg | ORAL_TABLET | Freq: Every day | ORAL | 1 refills | Status: DC
Start: 2020-11-02 — End: 2020-11-02

## 2020-11-02 MED ORDER — EMPAGLIFLOZIN 25 MG PO TABS: 25.0000 mg | ORAL_TABLET | Freq: Every day | ORAL | 11 refills | Status: DC

## 2020-11-02 MED ORDER — LOSARTAN POTASSIUM 25 MG PO TABS
25.0000 mg | ORAL_TABLET | Freq: Every day | ORAL | 1 refills | Status: DC
Start: 2020-11-02 — End: 2021-01-13

## 2020-11-02 MED ORDER — PEN NEEDLES 31G X 5 MM MISC: 15.0000 [IU] | Freq: Two times a day (BID) | 6 refills | Status: DC

## 2020-11-02 MED ORDER — INSULIN GLARGINE 100 UNIT/ML SOLOSTAR PEN: 15.0000 [IU] | PEN_INJECTOR | Freq: Two times a day (BID) | SUBCUTANEOUS | 11 refills | Status: DC

## 2020-11-02 MED ORDER — SPIRONOLACTONE 25 MG PO TABS
25.0000 mg | ORAL_TABLET | Freq: Every day | ORAL | 1 refills | Status: DC
Start: 2020-11-02 — End: 2020-11-02

## 2020-11-02 NOTE — Progress Notes (Signed)
Pt removed IV. Refused to have another one. NP notified.

## 2020-11-02 NOTE — Discharge Summary (Signed)
Physician Discharge Summary  Travis Lucas XYI:[id number] DOB: Nov 27, 1971 DOA: 10/26/2020  PCP: Patient, No Pcp Per  Admit date: 10/26/2020 Discharge date: 11/02/2020  Admitted From: Home Disposition: Home  Recommendations for Outpatient Follow-up:  1. Follow up with PCP in 1-2 weeks 2. Please obtain BMP/CBC in one week 3. Please follow up on the following pending results: None  Home Health: No Equipment/Devices: None Discharge Condition: Stable CODE STATUS: Full Diet recommendation: Heart Healthy / Carb Modified    Brief/Interim Summary: 49 year old male, homeless, recently moved to [place] from [place] on October 02, 2020, currently residing with his sister and plans to remain in [place], past medical history significant for nonischemic cardiomyopathy, chronic systolic CHF, AICD placed 09/20/2019, type II DM/IDDM, hypertension, hyperlipidemia, CAD, COOP, ongoing tobacco use disorder, THC use disorder, depression, GERD, gastric ulcer, MRSA bacteremia? Completed antibiotic course, previously on home oxygen but currently not, presented to the ED on 10/26/2020 due to marked lower extremity edema, dyspnea and orthopnea in the context of noncompliance with his medications for more than a month. He reportedly underwent cardiac cath recently at outside hospital and was told that his "arteries were clean". Admitted for acute on chronic systolic CHF. Cardiology was consulted. Repeat echocardiogram with EF of less than 20%.  She was diuresed with IV Lasix and diuresed well with net of more than 10 L.  Continue to have some lower extremity edema but improved a lot from his admission. He was also started on appropriate medications for HFrEF and make appointment to follow-up in heart failure clinic.  His UDS was positive for cannabinoid.  We counseled extensively during hospitalization against tobacco and substance abuse.  He will need persistent counseling from his primary care  provider.  Patient will continue with current medical regimen he was discharged on, medication was provided from medical management clinic.  He was also given information for [facility] clinics and advised to follow-up with his primary care provider.   Discharge Diagnoses:  Principal Problem:   Acute on chronic systolic CHF (congestive heart failure) (HCC) Active Problems:   Insulin-requiring or dependent type II diabetes mellitus (HCC)   BOOP (bronchiolitis obliterans with organizing pneumonia) (HCC)   Hypertensive urgency   Primary hypertension   Smoking   Discharge Instructions  Discharge Instructions    (HEART FAILURE PATIENTS) Call MD:  Anytime you have any of the following symptoms: 1) 3 pound weight gain in 24 hours or 5 pounds in 1 week 2) shortness of breath, with or without a dry hacking cough 3) swelling in the hands, feet or stomach 4) if you have to sleep on extra pillows at night in order to breathe.   Complete by: As directed    AMB referral to CHF clinic   Complete by: As directed    Avoid straining   Complete by: As directed    Diet - low sodium heart healthy   Complete by: As directed    Discharge instructions   Complete by: As directed    Okay with taking care of you. Please take your medications regularly as directed and follow-up in heart failure clinic. I made some changes to your insulin, now you will take insulin glargine twice a day, I also added another medication called [name] or empagliflozin as it will also help with your heart failure.  You need to work closely with your primary care provider to have your blood glucose under good control to prevent further complications.   Heart Failure patients record your daily  weight using the same scale at the same time of day   Complete by: As directed    Increase activity slowly   Complete by: As directed    STOP any activity that causes chest pain, shortness of breath, dizziness, sweating, or exessive  weakness   Complete by: As directed      Allergies as of 11/02/2020      Reactions   Penicillins       Medication List    STOP taking these medications   insulin aspart 100 UNIT/ML injection Commonly known as: novoLOG   insulin regular 100 units/mL injection Commonly known as: NOVOLIN R     TAKE these medications   atorvastatin 80 MG tablet Commonly known as: LIPITOR Take 1 tablet (80 mg total) by mouth daily.   empagliflozin 25 MG Tabs tablet Commonly known as: [name] Take 1 tablet (25 mg total) by mouth daily before breakfast.   furosemide 40 MG tablet Commonly known as: LASIX Take 1 tablet (40 mg total) by mouth 2 (two) times daily.   insulin glargine 100 UNIT/ML Solostar Pen Commonly known as: LANTUS Inject 15 Units into the skin 2 (two) times daily.   lidocaine 5 % Commonly known as: LIDODERM Place 1 patch onto the skin daily. Remove & Discard patch within 12 hours or as directed by MD   losartan 25 MG tablet Commonly known as: COZAAR Take 1 tablet (25 mg total) by mouth daily.   metoprolol succinate 50 MG 24 hr tablet Commonly known as: TOPROL-XL Take 1 tablet (50 mg total) by mouth daily. Take with or immediately following a meal.   naproxen sodium 220 MG tablet Commonly known as: ALEVE Take 220 mg by mouth as needed.   Pen Needles 31G X 5 MM Misc 15 Units by Does not apply route 2 (two) times daily.   spironolactone 25 MG tablet Commonly known as: ALDACTONE Take 1 tablet (25 mg total) by mouth daily.       Follow-up Information    [facility] Follow up on 11/04/2020.   Specialty: Cardiology Why: at 8:30am. Enter through the [facility] entrance Contact information: [address] [place] [address] [phone]             Allergies  Allergen Reactions  . Penicillins     Consultations:  Cardiology  Procedures/Studies: DG Chest 2 View  Result Date:  10/26/2020 CLINICAL DATA:  Lower extremity edema for several days, shortness of breath EXAM: CHEST - 2 VIEW COMPARISON:  09/26/2020 FINDINGS: Frontal and lateral views of the chest demonstrates stable AICD. Cardiac silhouette is enlarged. Slight increase in central vascular congestion, with interval development of small bilateral pleural effusions. No acute airspace disease. No pneumothorax. IMPRESSION: 1. Mild congestive heart failure, with increased central vascular congestion and small bilateral effusions. Electronically Signed   By: Sharlet Salina M.D.   On: 10/26/2020 16:33   ECHOCARDIOGRAM COMPLETE  Result Date: 10/27/2020    ECHOCARDIOGRAM REPORT   Patient Name:   Travis Lucas Date of Exam: 10/27/2020 Medical Rec #:  [id number]    Height:       75.0 in Accession #:    [id number]   Weight:       180.0 lb Date of Birth:  1971/04/11    BSA:          2.098 m Patient Age:    49 years     BP:  139/106 mmHg Patient Gender: M            HR:           90 bpm. Exam Location:  [facility] Procedure: 2D Echo, Color Doppler, Cardiac Doppler and Strain Analysis Indications:     I50.21 CHF-Acute Systolic  History:         Patient has no prior history of Echocardiogram examinations.                  CHF; Risk Factors:Diabetes.  Sonographer:     Humphrey Rolls RDCS (AE) Referring Phys:  [id number] Lennon Alstrom Diagnosing Phys: Debbe Odea MD  Sonographer Comments: Global longitudinal strain was attempted. IMPRESSIONS  1. Left ventricular ejection fraction, by estimation, is <20%. The left ventricle has severely decreased function. The left ventricle demonstrates global hypokinesis. The left ventricular internal cavity size was moderately to severely dilated. Left ventricular diastolic parameters are consistent with Grade III diastolic dysfunction (restrictive).  2. Right ventricular systolic function is severely reduced. The right ventricular size is mildly enlarged. There is moderately elevated pulmonary artery  systolic pressure.  3. Left atrial size was moderately dilated.  4. Right atrial size was moderately dilated.  5. The mitral valve is normal in structure. Moderate mitral valve regurgitation.  6. The aortic valve is tricuspid. Aortic valve regurgitation is not visualized.  7. The inferior vena cava is dilated in size with <50% respiratory variability, suggesting right atrial pressure of 15 mmHg. FINDINGS  Left Ventricle: 3D LVEF obtained but not reported due to inacurate tracking. Left ventricular ejection fraction, by estimation, is <20%. The left ventricle has severely decreased function. The left ventricle demonstrates global hypokinesis. Global longitudinal strain performed but not reported based on interpreter judgement due to suboptimal tracking. The left ventricular internal cavity size was moderately to severely dilated. There is no left ventricular hypertrophy. Left ventricular diastolic parameters are consistent with Grade III diastolic dysfunction (restrictive). Right Ventricle: The right ventricular size is mildly enlarged. No increase in right ventricular wall thickness. Right ventricular systolic function is severely reduced. There is moderately elevated pulmonary artery systolic pressure. The tricuspid regurgitant velocity is 2.99 m/s, and with an assumed right atrial pressure of 15 mmHg, the estimated right ventricular systolic pressure is 50.8 mmHg. Left Atrium: Left atrial size was moderately dilated. Right Atrium: Right atrial size was moderately dilated. Pericardium: Trivial pericardial effusion is present. Mitral Valve: The mitral valve is normal in structure. Moderate mitral valve regurgitation. MV peak gradient, 4.7 mmHg. The mean mitral valve gradient is 2.0 mmHg. Tricuspid Valve: The tricuspid valve is normal in structure. Tricuspid valve regurgitation is mild. Aortic Valve: The aortic valve is tricuspid. Aortic valve regurgitation is not visualized. Aortic valve mean gradient measures 2.0  mmHg. Aortic valve peak gradient measures 3.1 mmHg. Aortic valve area, by VTI measures 2.06 cm. Pulmonic Valve: The pulmonic valve was normal in structure. Pulmonic valve regurgitation is mild. Aorta: The aortic root is normal in size and structure. Venous: The inferior vena cava is dilated in size with less than 50% respiratory variability, suggesting right atrial pressure of 15 mmHg. IAS/Shunts: No atrial level shunt detected by color flow Doppler. Additional Comments: A pacer wire is visualized.  LEFT VENTRICLE PLAX 2D LVIDd:         6.79 cm  Diastology LVIDs:         5.67 cm  LV e' medial:    3.37 cm/s LV PW:         1.45  cm  LV E/e' medial:  31.5 LV IVS:        0.79 cm  LV e' lateral:   6.64 cm/s LVOT diam:     2.00 cm  LV E/e' lateral: 16.0 LV SV:         32 LV SV Index:   15 LVOT Area:     3.14 cm                          3D Volume EF:                         3D EF:        39 %                         LV EDV:       295 ml                         LV ESV:       180 ml                         LV SV:        115 ml RIGHT VENTRICLE RV Basal diam:  5.41 cm LEFT ATRIUM             Index       RIGHT ATRIUM           Index LA diam:        4.90 cm 2.34 cm/m  RA Area:     28.40 cm LA Vol (A2C):   91.6 ml 43.65 ml/m RA Volume:   91.10 ml  43.42 ml/m LA Vol (A4C):   94.8 ml 45.18 ml/m LA Biplane Vol: 96.2 ml 45.85 ml/m  AORTIC VALVE                   PULMONIC VALVE AV Area (Vmax):    2.36 cm    PV Vmax:       0.54 m/s AV Area (Vmean):   2.17 cm    PV Vmean:      37.500 cm/s AV Area (VTI):     2.06 cm    PV VTI:        0.100 m AV Vmax:           88.70 cm/s  PV Peak grad:  1.2 mmHg AV Vmean:          69.500 cm/s PV Mean grad:  1.0 mmHg AV VTI:            0.154 m AV Peak Grad:      3.1 mmHg AV Mean Grad:      2.0 mmHg LVOT Vmax:         66.60 cm/s LVOT Vmean:        47.900 cm/s LVOT VTI:          0.101 m LVOT/AV VTI ratio: 0.66  AORTA Ao Root diam: 3.70 cm MITRAL VALVE                TRICUSPID VALVE MV Area (PHT):  5.58 cm     TR Peak grad:   35.8 mmHg MV Peak grad:  4.7 mmHg     TR Vmax:        299.00 cm/s MV Mean grad:  2.0 mmHg MV  Vmax:       1.08 m/s     SHUNTS MV Vmean:      70.1 cm/s    Systemic VTI:  0.10 m MV Decel Time: 136 msec     Systemic Diam: 2.00 cm MV E velocity: 106.00 cm/s MV A velocity: 47.60 cm/s MV E/A ratio:  2.23 Debbe Odea MD Electronically signed by Debbe Odea MD Signature Date/Time: 10/27/2020/4:53:10 PM    Final      Subjective: Patient was resting comfortably when seen today.  No new complaint.  He was lying very flat without any difficulty.  Discharge Exam: Vitals:   11/02/20 0511 11/02/20 0834  BP: 120/89 (!) 121/94  Pulse: 93 (!) 101  Resp: 18 18  Temp: 97.9 F (36.6 C) 98.7 F (37.1 C)  SpO2: 100% 93%   Vitals:   11/02/20 0029 11/02/20 0457 11/02/20 0511 11/02/20 0834  BP: (!) 128/93  120/89 (!) 121/94  Pulse: 97  93 (!) 101  Resp: 18  18 18   Temp: 98.5 F (36.9 C)  97.9 F (36.6 C) 98.7 F (37.1 C)  TempSrc:      SpO2: 99%  100% 93%  Weight:  81.7 kg    Height:        General: Pt is alert, awake, not in acute distress Cardiovascular: RRR, S1/S2 +, no rubs, no gallops Respiratory: CTA bilaterally, no wheezing, no rhonchi Abdominal: Soft, NT, ND, bowel sounds + Extremities: 1+ LE edema, no cyanosis   The results of significant diagnostics from this hospitalization (including imaging, microbiology, ancillary and laboratory) are listed below for reference.    Microbiology: Recent Results (from the past 240 hour(s))  Resp Panel by RT-PCR (Flu A&B, Covid) Nasopharyngeal Swab     Status: None   Collection Time: 10/26/20 11:48 PM   Specimen: Nasopharyngeal Swab; Nasopharyngeal(NP) swabs in vial transport medium  Result Value Ref Range Status   SARS Coronavirus 2 by RT PCR NEGATIVE NEGATIVE Final    Comment: (NOTE) SARS-CoV-2 target nucleic acids are NOT DETECTED.  The SARS-CoV-2 RNA is generally detectable in upper  respiratory specimens during the acute phase of infection. The lowest concentration of SARS-CoV-2 viral copies this assay can detect is 138 copies/mL. A negative result does not preclude SARS-Cov-2 infection and should not be used as the sole basis for treatment or other patient management decisions. A negative result may occur with  improper specimen collection/handling, submission of specimen other than nasopharyngeal swab, presence of viral mutation(s) within the areas targeted by this assay, and inadequate number of viral copies(<138 copies/mL). A negative result must be combined with clinical observations, patient history, and epidemiological information. The expected result is Negative.  Fact Sheet for Patients:  BloggerCourse.com  Fact Sheet for Healthcare Providers:  SeriousBroker.it  This test is no t yet approved or cleared by the [place] FDA and  has been authorized for detection and/or diagnosis of SARS-CoV-2 by FDA under an Emergency Use Authorization (EUA). This EUA will remain  in effect (meaning this test can be used) for the duration of the COVID-19 declaration under Section 564(b)(1) of the Act, 21 U.S.C.section 360bbb-3(b)(1), unless the authorization is terminated  or revoked sooner.       Influenza A by PCR NEGATIVE NEGATIVE Final   Influenza B by PCR NEGATIVE NEGATIVE Final    Comment: (NOTE) The Xpert Xpress SARS-CoV-2/FLU/RSV plus assay is intended as an aid in the diagnosis of influenza from Nasopharyngeal swab specimens and should not be used as a  sole basis for treatment. Nasal washings and aspirates are unacceptable for Xpert Xpress SARS-CoV-2/FLU/RSV testing.  Fact Sheet for Patients: BloggerCourse.com  Fact Sheet for Healthcare Providers: SeriousBroker.it  This test is not yet approved or cleared by the [place] FDA and has been  authorized for detection and/or diagnosis of SARS-CoV-2 by FDA under an Emergency Use Authorization (EUA). This EUA will remain in effect (meaning this test can be used) for the duration of the COVID-19 declaration under Section 564(b)(1) of the Act, 21 U.S.C. section 360bbb-3(b)(1), unless the authorization is terminated or revoked.  Performed at [facility], [address] [place], [place] [address]      Labs: BNP (last 3 results) Recent Labs    10/26/20 1600  BNP 1,396.1*   Basic Metabolic Panel: Recent Labs  Lab 10/29/20 0528 10/30/20 0616 10/31/20 0635 11/01/20 0450 11/02/20 0454  NA 136 138 136 138 137  K 3.9 4.1 3.6 3.8 4.0  CL 101 102 100 101 102  CO2 GLUCOSE 260* 277* 217* 235* 184*  BUN 22* CREATININE 0.73 0.78 0.73 0.79 0.91  CALCIUM 7.9* 8.0* 7.8* 8.0* 7.9*   Liver Function Tests: Recent Labs  Lab 10/28/20 0457  AST 40  ALT 45*  ALKPHOS 440*  BILITOT 1.0  PROT 5.7*  ALBUMIN 2.2*   No results for input(s): LIPASE, AMYLASE in the last 168 hours. No results for input(s): AMMONIA in the last 168 hours. CBC: Recent Labs  Lab 10/26/20 1600 10/28/20 0457  WBC 5.8 5.8  HGB 11.2* 11.3*  HCT 35.9* 35.1*  MCV 89.3 88.4  PLT 227 228   Cardiac Enzymes: No results for input(s): CKTOTAL, CKMB, CKMBINDEX, TROPONINI in the last 168 hours. BNP: Invalid input(s): POCBNP CBG: Recent Labs  Lab 11/01/20 0801 11/01/20 1222 11/01/20 1607 11/01/20 2121 11/02/20 0833  GLUCAP 156* 209* 175* 224* 355*   D-Dimer No results for input(s): DDIMER in the last 72 hours. Hgb A1c No results for input(s): HGBA1C in the last 72 hours. Lipid Profile No results for input(s): CHOL, HDL, LDLCALC, TRIG, CHOLHDL, LDLDIRECT in the last 72 hours. Thyroid function studies No results for input(s): TSH, T4TOTAL, T3FREE, THYROIDAB in the last 72 hours.  Invalid input(s): FREET3 Anemia work up No results for input(s): VITAMINB12,  FOLATE, FERRITIN, TIBC, IRON, RETICCTPCT in the last 72 hours. Urinalysis    Component Value Date/Time   COLORURINE AMBER (A) 09/21/2020 1834   APPEARANCEUR HAZY (A) 09/21/2020 1834   LABSPEC 1.031 (H) 09/21/2020 1834   PHURINE 5.0 09/21/2020 1834   GLUCOSEU NEGATIVE 09/21/2020 1834   HGBUR SMALL (A) 09/21/2020 1834   BILIRUBINUR NEGATIVE 09/21/2020 1834   KETONESUR NEGATIVE 09/21/2020 1834   PROTEINUR >=300 (A) 09/21/2020 1834   NITRITE NEGATIVE 09/21/2020 1834   LEUKOCYTESUR NEGATIVE 09/21/2020 1834   Sepsis Labs Invalid input(s): PROCALCITONIN,  WBC,  LACTICIDVEN Microbiology Recent Results (from the past 240 hour(s))  Resp Panel by RT-PCR (Flu A&B, Covid) Nasopharyngeal Swab     Status: None   Collection Time: 10/26/20 11:48 PM   Specimen: Nasopharyngeal Swab; Nasopharyngeal(NP) swabs in vial transport medium  Result Value Ref Range Status   SARS Coronavirus 2 by RT PCR NEGATIVE NEGATIVE Final    Comment: (NOTE) SARS-CoV-2 target nucleic acids are NOT DETECTED.  The SARS-CoV-2 RNA is generally detectable in upper respiratory specimens during the acute phase of infection. The lowest concentration of SARS-CoV-2 viral copies this assay can detect is 138  copies/mL. A negative result does not preclude SARS-Cov-2 infection and should not be used as the sole basis for treatment or other patient management decisions. A negative result may occur with  improper specimen collection/handling, submission of specimen other than nasopharyngeal swab, presence of viral mutation(s) within the areas targeted by this assay, and inadequate number of viral copies(<138 copies/mL). A negative result must be combined with clinical observations, patient history, and epidemiological information. The expected result is Negative.  Fact Sheet for Patients:  BloggerCourse.com  Fact Sheet for Healthcare Providers:  SeriousBroker.it  This test is no  t yet approved or cleared by the [place] FDA and  has been authorized for detection and/or diagnosis of SARS-CoV-2 by FDA under an Emergency Use Authorization (EUA). This EUA will remain  in effect (meaning this test can be used) for the duration of the COVID-19 declaration under Section 564(b)(1) of the Act, 21 U.S.C.section 360bbb-3(b)(1), unless the authorization is terminated  or revoked sooner.       Influenza A by PCR NEGATIVE NEGATIVE Final   Influenza B by PCR NEGATIVE NEGATIVE Final    Comment: (NOTE) The Xpert Xpress SARS-CoV-2/FLU/RSV plus assay is intended as an aid in the diagnosis of influenza from Nasopharyngeal swab specimens and should not be used as a sole basis for treatment. Nasal washings and aspirates are unacceptable for Xpert Xpress SARS-CoV-2/FLU/RSV testing.  Fact Sheet for Patients: BloggerCourse.com  Fact Sheet for Healthcare Providers: SeriousBroker.it  This test is not yet approved or cleared by the [place] FDA and has been authorized for detection and/or diagnosis of SARS-CoV-2 by FDA under an Emergency Use Authorization (EUA). This EUA will remain in effect (meaning this test can be used) for the duration of the COVID-19 declaration under Section 564(b)(1) of the Act, 21 U.S.C. section 360bbb-3(b)(1), unless the authorization is terminated or revoked.  Performed at [facility], [address] [place], [place] [address]     Time coordinating discharge: Over 30 minutes  SIGNED:  Arnetha Courser, MD  [facility] 11/02/2020, 10:42 AM  If 7PM-7AM, please contact night-coverage [email or web address]  This record has been created using Conservation officer, historic buildings. Errors have been sought and corrected,but may not always be located. Such creation errors do not reflect on the standard of care.

## 2020-11-02 NOTE — TOC Transition Note (Signed)
Transition of Care [facility]) - CM/SW Discharge Note   Patient Details  Name: Travis Lucas MRN: [id number] Date of Birth: 05/11/1971  [facility] CM/SW Contact:  Allayne Butcher, RN Phone Number: 11/02/2020, 11:06 AM   Clinical Narrative:    Patient has been medically cleared for discharge home today.  Patient lives with his sister, Travis Lucas, in [place].  Patient has been staying with her for about a month.  Before living with his sister he was homeless in [place].  Patient's sister is very supportive.  She will be able to pick the patient up today.  Appointment has been scheduled at the Heart Failure Clinic for Wed.  RNCM made a referral to [facility].  Application for both will be provided to patient in his discharge packet.  Patient's sister is aware that they need to pick up his prescriptions over at [facility] Management today.   Travis Lucas reports that they will be working on [employer] and disability for the patient he has a case worker in [place] that reports his application can be transferred to [facility].     Final next level of care: Home/Self Care Barriers to Discharge: Barriers Resolved   Patient Goals and CMS Choice        Discharge Placement                       Discharge Plan and Services   Discharge Planning Services: CM Consult, Medication Assistance, Follow-up appt scheduled, HF Clinic, Indigent Health Clinic            DME Arranged: N/A DME Agency: NA       HH Arranged: NA          Social Determinants of Health (SDOH) Interventions     Readmission Risk Interventions No flowsheet data found.

## 2020-11-04 ENCOUNTER — Telehealth: Payer: Self-pay

## 2020-11-04 ENCOUNTER — Ambulatory Visit: Payer: Self-pay | Admitting: Family

## 2020-11-04 NOTE — Progress Notes (Signed)
Patient ID: Travis Lucas, male    DOB: Aug 29, 1971, 49 y.o.   MRN: [id number]  HPI  Travis Lucas is a 49 y/o male with a history of DM, HTN, BOOP, hyperlipidemia, current tobacco use and chronic heart failure.   Echo report from 10/27/20 reviewed and showed an EF of <20% along with moderate Travis and moderately elevated PA pressure.   Admitted 10/26/20 due to acute on chronic HF. Cardiology consult obtained. Initially given IV lasix with resultant loss of 10L with transition to oral diuretics. Discharged after 7 days.   He presents today for his initial visit with a chief complaint of minimal shortness of breath upon moderate exertion. He describes this as chronic in nature having been present for a couple of years although he does feel like it's improving. He has associated fatigue, intermittent numbness in his feel and chronic difficulty sleeping along with this. He denies any dizziness, abdominal distention, palpitations, pedal edema, chest pain or cough.   Does not have any scales. Was living in [place] and then became homeless. Currently living here locally with a sister and is in the process of trying to get his [place] medicaid transferred to [facility]. Mentions wanting to find an indoor pool to work his muscles. When he was in [facility] [place], he was very active playing sports and lifting weights and subsequently has lost a lot of muscle mass.   Past Medical History:  Diagnosis Date  . BOOP (bronchiolitis obliterans with organizing pneumonia) (HCC)   . CHF (congestive heart failure) (HCC)   . Chronic back pain   . Diabetes mellitus without complication (HCC)   . Hyperlipidemia   . Hypertension    Past Surgical History:  Procedure Laterality Date  . CARDIAC DEFIBRILLATOR PLACEMENT  09/10/2019  . CHOLECYSTECTOMY     History reviewed. No pertinent family history. Social History   Tobacco Use  . Smoking status: Current Every Day Smoker  . Smokeless tobacco: Never Used  . Tobacco comment: 1 cigarrette a  day   Substance Use Topics  . Alcohol use: Yes    Comment: "socially"    Allergies  Allergen Reactions  . Penicillins    Prior to Admission medications   Medication Sig Start Date End Date Taking? Authorizing Provider  atorvastatin (LIPITOR) 80 MG tablet Take 1 tablet (80 mg total) by mouth daily. 11/02/20  Yes Arnetha Courser, MD  furosemide (LASIX) 40 MG tablet Take 1 tablet (40 mg total) by mouth 2 (two) times daily. 11/02/20  Yes Arnetha Courser, MD  insulin glargine (LANTUS) 100 UNIT/ML Solostar Pen Inject 15 Units into the skin 2 (two) times daily. 11/02/20  Yes Arnetha Courser, MD  Insulin Pen Needle (PEN NEEDLES) 31G X 5 MM MISC 15 Units by Does not apply route 2 (two) times daily. 11/02/20  Yes Arnetha Courser, MD  lidocaine (LIDODERM) 5 % Place 1 patch onto the skin daily. Remove & Discard patch within 12 hours or as directed by MD 11/02/20  Yes Arnetha Courser, MD  losartan (COZAAR) 25 MG tablet Take 1 tablet (25 mg total) by mouth daily. 11/02/20  Yes Arnetha Courser, MD  metoprolol succinate (TOPROL-XL) 50 MG 24 hr tablet Take 1 tablet (50 mg total) by mouth daily. Take with or immediately following a meal. 11/02/20  Yes Arnetha Courser, MD  naproxen sodium (ALEVE) 220 MG tablet Take 220 mg by mouth as needed.   Yes [provider]  spironolactone (ALDACTONE) 25 MG tablet Take 1 tablet (25 mg total) by  mouth daily. 11/02/20  Yes Arnetha Courser, MD    Review of Systems  Constitutional: Positive for fatigue. Negative for appetite change.  HENT: Negative for congestion, rhinorrhea and sore throat.   Eyes: Negative.   Respiratory: Positive for shortness of breath ("improving"). Negative for cough.   Cardiovascular: Negative for chest pain, palpitations and leg swelling.  Gastrointestinal: Negative for abdominal distention and abdominal pain.  Endocrine: Negative.   Genitourinary: Negative.   Musculoskeletal: Negative for back pain and neck pain.  Skin: Negative.   Allergic/Immunologic:  Negative.   Neurological: Positive for numbness ("at times"). Negative for dizziness and light-headedness.  Hematological: Negative for adenopathy. Does not bruise/bleed easily.  Psychiatric/Behavioral: Positive for sleep disturbance (has taken remeron in the past). Negative for dysphoric mood. The patient is not nervous/anxious.    Vitals:   11/05/20 1434  BP: 106/82  Pulse: 94  Resp: 18  SpO2: 100%  Weight: 162 lb 6 oz (73.7 kg)  Height: 6\' 3"  (1.905 m)   Wt Readings from Last 3 Encounters:  11/05/20 162 lb 6 oz (73.7 kg)  11/02/20 180 lb 1.9 oz (81.7 kg)  09/26/20 185 lb (83.9 kg)   Lab Results  Component Value Date   CREATININE 0.91 11/02/2020   CREATININE 0.79 11/01/2020   CREATININE 0.73 10/31/2020    Physical Exam Vitals and nursing note reviewed.  Constitutional:      Appearance: He is well-developed.  HENT:     Head: Normocephalic and atraumatic.  Neck:     Vascular: No JVD.  Cardiovascular:     Rate and Rhythm: Normal rate and regular rhythm.  Pulmonary:     Effort: Pulmonary effort is normal. No respiratory distress.     Breath sounds: No wheezing or rales.  Abdominal:     Palpations: Abdomen is soft.     Tenderness: There is no abdominal tenderness.  Musculoskeletal:     Cervical back: Neck supple.     Right lower leg: No tenderness. Edema (around right ankle) present.     Left lower leg: No tenderness. Edema (around ankle) present.  Skin:    General: Skin is warm and dry.  Neurological:     General: No focal deficit present.     Mental Status: He is alert and oriented to person, place, and time.  Psychiatric:        Mood and Affect: Mood normal.        Behavior: Behavior normal.    Assessment & Plan:  1: Chronic heart failure with reduced ejection fraction- - NYHA class II - euvolemic today - scales given today and he was instructed to weigh every morning, write the weight down and call for an overnight weight gain of > 2 pounds or a weekly  weight gain of >5 pounds - not adding salt and has started to read food labels for sodium content - did not have cardiology f/u so this was scheduled for 11/10/20; emphasized to patient that he can not be late to the appointment (he's shown up late to both appointments that we've had scheduled w/him) - consider changing his losartan to entresto, titrating up metoprolol and adding either farxiga/ jardiance  - has AICD (placed Oct 2020) - BNP 10/26/20 was 1396.1  2: HTN- - BP looks good today - needs to get established with [facility] as he's currently uninsured; trying to get his [place] medicaid transferred to [facility] - BMP 11/02/20 reviewed and showed sodium 137, potassium 4.0, creatinine 0.91 and GFR >60  3: DM- - A1c 10/27/20 was 11.9%  4: Tobacco use- - smoking 1 cigarette daily   Medication bottles reviewed.   Return here in 6 weeks or sooner for any questions/problems before then.

## 2020-11-04 NOTE — Telephone Encounter (Signed)
   48-hour post hospital discharge heart failure phone call.  Call to patient as he did not show for his outpatient heart failure clinic appointment this morning.  States that he feels that he is doing fairly well although he has tired and not sleeping well due to neuropathy discomfort in his feet.  States that his lower extremity edema continues to improve.  Is not weighing himself daily as he did not purchase a scale for discharge.  I am asking Clarisa Kindred with the heart failure outpatient clinic to apply him with a scale.  Also in going over his discharge medications he is not taking the [name].  States when he received his medications from medical management that the [name] was not in the bag.   He also does not have a follow-up appointment with cardiology.  And have made [facility] aware.  He is to follow-up with her on December 9 at 2 PM in the afternoon.   Tresa Endo RN, CHFN

## 2020-11-05 ENCOUNTER — Encounter: Payer: Self-pay | Admitting: Family

## 2020-11-05 ENCOUNTER — Other Ambulatory Visit: Payer: Self-pay

## 2020-11-05 ENCOUNTER — Ambulatory Visit: Payer: Self-pay | Attending: Family | Admitting: Family

## 2020-11-05 VITALS — BP 106/82 | HR 94 | Resp 18 | Ht 75.0 in | Wt 162.4 lb

## 2020-11-05 DIAGNOSIS — F17210 Nicotine dependence, cigarettes, uncomplicated: Secondary | ICD-10-CM | POA: Insufficient documentation

## 2020-11-05 DIAGNOSIS — Z79899 Other long term (current) drug therapy: Secondary | ICD-10-CM | POA: Insufficient documentation

## 2020-11-05 DIAGNOSIS — Z9049 Acquired absence of other specified parts of digestive tract: Secondary | ICD-10-CM | POA: Insufficient documentation

## 2020-11-05 DIAGNOSIS — E785 Hyperlipidemia, unspecified: Secondary | ICD-10-CM | POA: Insufficient documentation

## 2020-11-05 DIAGNOSIS — I5022 Chronic systolic (congestive) heart failure: Secondary | ICD-10-CM | POA: Insufficient documentation

## 2020-11-05 DIAGNOSIS — J8489 Other specified interstitial pulmonary diseases: Secondary | ICD-10-CM | POA: Insufficient documentation

## 2020-11-05 DIAGNOSIS — Z95810 Presence of automatic (implantable) cardiac defibrillator: Secondary | ICD-10-CM | POA: Insufficient documentation

## 2020-11-05 DIAGNOSIS — Z880 Allergy status to penicillin: Secondary | ICD-10-CM | POA: Insufficient documentation

## 2020-11-05 DIAGNOSIS — I110 Hypertensive heart disease with heart failure: Secondary | ICD-10-CM | POA: Insufficient documentation

## 2020-11-05 DIAGNOSIS — Z794 Long term (current) use of insulin: Secondary | ICD-10-CM | POA: Insufficient documentation

## 2020-11-05 DIAGNOSIS — F17200 Nicotine dependence, unspecified, uncomplicated: Secondary | ICD-10-CM

## 2020-11-05 DIAGNOSIS — E119 Type 2 diabetes mellitus without complications: Secondary | ICD-10-CM | POA: Insufficient documentation

## 2020-11-05 DIAGNOSIS — I10 Essential (primary) hypertension: Secondary | ICD-10-CM

## 2020-11-05 NOTE — Patient Instructions (Addendum)
Begin weighing daily and call for an overnight weight gain of > 2 pounds or a weekly weight gain of >5 pounds. 

## 2020-11-06 ENCOUNTER — Telehealth: Payer: Self-pay | Admitting: Pharmacy Technician

## 2020-11-06 NOTE — Telephone Encounter (Signed)
Patient received a 30 day supply of medication.  Provided patient with new patient packet to obtain ongoing [facility] services.  [facility] must receive requested financial documentation within 30 days in order to determine eligibility and provide additional medication assistance.  Daemion Mcniel J. Chany Woolworth Care Manager [facility] 

## 2020-11-10 ENCOUNTER — Encounter: Payer: Self-pay | Admitting: Physician Assistant

## 2020-11-10 ENCOUNTER — Ambulatory Visit (INDEPENDENT_AMBULATORY_CARE_PROVIDER_SITE_OTHER): Payer: Self-pay | Admitting: Physician Assistant

## 2020-11-10 ENCOUNTER — Other Ambulatory Visit: Payer: Self-pay

## 2020-11-10 VITALS — BP 90/70 | HR 87 | Ht 75.0 in | Wt 169.5 lb

## 2020-11-10 DIAGNOSIS — Z95810 Presence of automatic (implantable) cardiac defibrillator: Secondary | ICD-10-CM

## 2020-11-10 DIAGNOSIS — I5023 Acute on chronic systolic (congestive) heart failure: Secondary | ICD-10-CM

## 2020-11-10 DIAGNOSIS — Z7289 Other problems related to lifestyle: Secondary | ICD-10-CM

## 2020-11-10 DIAGNOSIS — E785 Hyperlipidemia, unspecified: Secondary | ICD-10-CM

## 2020-11-10 DIAGNOSIS — Z789 Other specified health status: Secondary | ICD-10-CM

## 2020-11-10 DIAGNOSIS — E119 Type 2 diabetes mellitus without complications: Secondary | ICD-10-CM

## 2020-11-10 DIAGNOSIS — I10 Essential (primary) hypertension: Secondary | ICD-10-CM

## 2020-11-10 DIAGNOSIS — Z794 Long term (current) use of insulin: Secondary | ICD-10-CM

## 2020-11-10 DIAGNOSIS — Z720 Tobacco use: Secondary | ICD-10-CM

## 2020-11-10 DIAGNOSIS — F1290 Cannabis use, unspecified, uncomplicated: Secondary | ICD-10-CM

## 2020-11-10 DIAGNOSIS — I5022 Chronic systolic (congestive) heart failure: Secondary | ICD-10-CM

## 2020-11-10 NOTE — Patient Instructions (Signed)
Medication Instructions:  Your physician recommends that you continue on your current medications as directed. Please refer to the Current Medication list given to you today.  *If you need a refill on your cardiac medications before your next appointment, please call your pharmacy*   Lab Work: Your physician recommends that you have lab work TODAY:  Bmet, CBC  If you have labs (blood work) drawn today and your tests are completely normal, you will receive your results only by: Marland Kitchen MyChart Message (if you have MyChart) OR . A paper copy in the mail If you have any lab test that is abnormal or we need to change your treatment, we will call you to review the results.   Testing/Procedures: None ordered   Follow-Up: At [facility], you and your health needs are our priority.  As part of our continuing mission to provide you with exceptional heart care, we have created designated Provider Care Teams.  These Care Teams include your primary Cardiologist (physician) and Advanced Practice Providers (APPs -  Physician Assistants and Nurse Practitioners) who all work together to provide you with the care you need, when you need it.  We recommend signing up for the patient portal called "MyChart".  Sign up information is provided on this After Visit Summary.  MyChart is used to connect with patients for Virtual Visits (Telemedicine).  Patients are able to view lab/test results, encounter notes, upcoming appointments, etc.  Non-urgent messages can be sent to your provider as well.   To learn more about what you can do with MyChart, go to [email or web address].    Your next appointment:   1 month(s)  The format for your next appointment:   In Person  Provider:   You may see Debbe Odea, MD or one of the following Advanced Practice Providers on your designated Care Team:    Nicolasa Ducking, NP  Eula Listen, PA-C  Marisue Ivan, PA-C  Cadence Millbrae, [place]  Gillian Shields,  NP

## 2020-11-10 NOTE — Progress Notes (Signed)
Office Visit    Patient Name: Travis Lucas Date of Encounter: 11/10/2020  Primary Care Provider:  Patient, No Pcp Per Primary Cardiologist:  Debbe Odea, MD  Chief Complaint    Chief Complaint  Patient presents with  . Follow-up    [facility]; CHF. Pt. C/o pain/numbness in feet.     49 year old male with history of HFrEF secondary to nonischemic cardiomyopathy s/p [employer] Jude medical ICD 08/2019, hypertension, hyperlipidemia, tobacco use, and seen today for hospital follow-up.  Past Medical History    Past Medical History:  Diagnosis Date  . BOOP (bronchiolitis obliterans with organizing pneumonia) (HCC)   . CHF (congestive heart failure) (HCC)   . Chronic back pain   . Diabetes mellitus without complication (HCC)   . Hyperlipidemia   . Hypertension    Past Surgical History:  Procedure Laterality Date  . CARDIAC DEFIBRILLATOR PLACEMENT  09/10/2019  . CHOLECYSTECTOMY      Allergies  Allergies  Allergen Reactions  . Penicillins     History of Present Illness    Travis Lucas is a 49 y.o. male with PMH as above. He moved here from [place], [place], where he was homeless, and in order to be with his sisters. He reports a recent hospitalization in [place], during which time he was told that he had reduced LV function.  Since that time, he reports staying away from fluids but continuing to eat salt. He denies any family history of heart disease, including sudden cardiac death. Past surgical history listed on medical records copied from [place] shows cardiac defibrillator placement 09/20/2019 with dual-chamber ICD by Dr. Tobie Poet.   At time of 09/2020 admission, he smoked 1 cigarette/day, reduced from 10 cigarettes daily.  He was drinking 1 beer daily.  He noted daily marijuana use. As below, he has since decreased to 1-2 x week use of tobacco, EtOH, and marijuana.   He was recently admitted 09/2020 to [facility] after progressive shortness of breath, dyspnea, and lower  extremity edema over the last month and s/p recent hospitalization in [place] for systolic heart failure.  He reported intermittent racing heart rate and dizziness with hospital records showing previous diagnosis of vertigo.  Cardiology was consulted for nonischemic cardiomyopathy with a EF less than 20%.  He was IV diuresed with improved symptoms and continued on Toprol, losartan, Aldactone, Lipitor, and Lasix 40 mg twice daily.  Today, 11/10/2020, he returns to clinic and notes that he continues to do well following his recent discharge.  He denies any chest pain, presyncope, syncope.  No reported signs or symptoms of volume overload. He does report he had "a bad salt day yesterday."  He states his sister made oriental wings and he ate these wings with sausages and a slice of pizza.  He is smoking 1 to 2 cigarettes/week and marijuana 1-2 times per week, which she states is decreased from before his admission.  He is trying to cut back on beer, which he states is an accomplishment, given his sister is a [employer]isure [employer] manager and he is constantly surrounded by alcohol.  He is currently drinking 1-2 beers per week.  He reports bilateral lower extremity paresthesias with recent A1c noted to be elevated to almost 12.  He does not yet have a PCP with recommendation to establish with a PCP for further management of his glucose, as well as given his desire to start gabapentin for his bilateral paresthesias.  He reportedly has taken gabapentin in the past for this discomfort.  His weight is up from that of his previous weight, attributed to his heavy shoes and jacket today.  He is hypotensive but denies any dizziness and reports history of low BP (seen via EMR / labile BP on recent admission). Also, he reports dehydration today with very little intake of water, which he states may be contributing. He expresses some frustration regarding getting insurance coverage.  He reports medication compliance.  Home Medications     Current Outpatient Medications on File Prior to Visit  Medication Sig Dispense Refill  . atorvastatin (LIPITOR) 80 MG tablet Take 1 tablet (80 mg total) by mouth daily. 90 tablet 1  . furosemide (LASIX) 40 MG tablet Take 1 tablet (40 mg total) by mouth 2 (two) times daily. 60 tablet 2  . insulin glargine (LANTUS) 100 UNIT/ML Solostar Pen Inject 15 Units into the skin 2 (two) times daily. 15 mL 11  . Insulin Pen Needle (PEN NEEDLES) 31G X 5 MM MISC 15 Units by Does not apply route 2 (two) times daily. 100 each 6  . lidocaine (LIDODERM) 5 % Place 1 patch onto the skin daily. Remove & Discard patch within 12 hours or as directed by MD 30 patch 0  . losartan (COZAAR) 25 MG tablet Take 1 tablet (25 mg total) by mouth daily. 90 tablet 1  . metoprolol succinate (TOPROL-XL) 50 MG 24 hr tablet Take 1 tablet (50 mg total) by mouth daily. Take with or immediately following a meal. 90 tablet 1  . naproxen sodium (ALEVE) 220 MG tablet Take 220 mg by mouth as needed.    Marland Kitchen spironolactone (ALDACTONE) 25 MG tablet Take 1 tablet (25 mg total) by mouth daily. 90 tablet 1   No current facility-administered medications on file prior to visit.    Review of Systems    He denies chest pain, palpitations, dyspnea, pnd, orthopnea, n, v, dizziness, syncope, edema, weight gain, or early satiety. He reports bilateral lower extremity paresthesias.   All other systems reviewed and are otherwise negative except as noted above.  Physical Exam    VS:  BP 90/70 (BP Location: Left Arm, Patient Position: Sitting, Cuff Size: Normal)   Pulse 87   Ht 6\' 3"  (1.905 m)   Wt 169 lb 8 oz (76.9 kg)   SpO2 99%   BMI 21.19 kg/m  , BMI Body mass index is 21.19 kg/m. GEN: Well nourished, well developed, in no acute distress. HEENT: normal. Neck: Supple, no JVD, carotid bruits, or masses. Cardiac: RRR, no murmurs, rubs, or gallops. No clubbing, cyanosis, edema.  Radials/DP/PT 2+ and equal bilaterally.  Respiratory:  Respirations  regular and unlabored, clear to auscultation bilaterally. GI: Soft, nontender, nondistended, BS + x 4. MS: no deformity or atrophy. Skin: warm and dry, no rash. Neuro:  Strength and sensation are intact. Psych: Normal affect.  Accessory Clinical Findings    ECG personally reviewed by me today -NSR, 87 bpm, LAD, left atrial enlargement, IVCD with QRS , LVH with repolarization abnormality, incomplete right bundle branch block, prolonged QTC at , PRi , poor R wave progression- no acute changes.  VITALS Reviewed today   Temp Readings from Last 3 Encounters:  11/02/20 98.6 F (37 C)  09/26/20 98 F (36.7 C)  09/21/20 98.2 F (36.8 C) (Oral)   BP Readings from Last 3 Encounters:  11/10/20 90/70  11/05/20 106/82  11/02/20 (!) 136/96   Pulse Readings from Last 3 Encounters:  11/10/20 87  11/05/20 94  11/02/20 (!) 102  Wt Readings from Last 3 Encounters:  11/10/20 169 lb 8 oz (76.9 kg)  11/05/20 162 lb 6 oz (73.7 kg)  11/02/20 180 lb 1.9 oz (81.7 kg)     LABS  reviewed today   Lab Results  Component Value Date   WBC 5.8 10/28/2020   HGB 11.3 (L) 10/28/2020   HCT 35.1 (L) 10/28/2020   MCV 88.4 10/28/2020   PLT 228 10/28/2020   Lab Results  Component Value Date   CREATININE 0.91 11/02/2020   BUN 18 11/02/2020   NA 137 11/02/2020   K 4.0 11/02/2020   CL 102 11/02/2020   CO2 28 11/02/2020   Lab Results  Component Value Date   ALT 45 (H) 10/28/2020   AST 40 10/28/2020   ALKPHOS 440 (H) 10/28/2020   BILITOT 1.0 10/28/2020   No results found for: CHOL, HDL, LDLCALC, LDLDIRECT, TRIG, CHOLHDL  Lab Results  Component Value Date   HGBA1C 11.9 (H) 10/27/2020   Lab Results  Component Value Date   TSH 2.707 10/27/2020     STUDIES/PROCEDURES reviewed today   10/27/2020 Echo 1. Left ventricular ejection fraction, by estimation, is <20%. The left  ventricle has severely decreased function. The left ventricle demonstrates  global hypokinesis. The  left ventricular internal cavity size was  moderately to severely dilated. Left  ventricular diastolic parameters are consistent with Grade III diastolic  dysfunction (restrictive).  2. Right ventricular systolic function is severely reduced. The right  ventricular size is mildly enlarged. There is moderately elevated  pulmonary artery systolic pressure.  3. Left atrial size was moderately dilated.  4. Right atrial size was moderately dilated.  5. The mitral valve is normal in structure. Moderate mitral valve  regurgitation.  6. The aortic valve is tricuspid. Aortic valve regurgitation is not  visualized.  7. The inferior vena cava is dilated in size with <50% respiratory  variability, suggesting right atrial pressure of 15 mmHg.   Assessment & Plan    Chronic systolic heart failure s/p AICD (EF unknown) --Euvolemic on exam.  No reported SOB/DOE. Previous echo as above with EF <20%.  --Continue current lasix 40mg  BID, losartan 25mg  qd, Toprol 50mg  daily, Spironolactone 25mg  daily.   --Recheck BMET. --Escalation of GDMT limited by soft BP today and finances.  --If finances, BP, and Cr allows at RTC, consider transition from Losartan to [facility], which was discussed today.  --Reviewed CHF education. Encouraged daily wt and BP monitoring. Fluid and Na restriction discussed.  --Recommend establish with EP once insurance coverage permits or if able to pay out of pocket. S/p St. Jude Medical ICD 08/2019. Will contact social work for assistance with insurance coverage of visits and medications, if possible. He may benefit from receiving his most recent clinic notes, per patient, and in order to get insurance coverage.  CAD  --No chest pain or sx concerning for angina. EKG without acute ST/T changes.  --Continue current medical management, including high intensity statin for risk factor modification.  --Glycemic control recommended, given elevated A1C. Recommend establish with a  PCP. --Recommend cessation of tobacco, EtOH, and marijuana.  --Diet and lifestyle changes discussed at length today.  HTN, BP goal <130/80 --Currently hypotensive. Reports he is asx. No reported dizziness or syncope.  --Will check BMET to ensure no prerenal AKI. --Continue BB, losartan as BP and Cr allows. Reports lower BP today due to dehydration, as he has not yet had very much water today. Also has a history of soft BP/hypotension and labile  BP on review of recent BP during admission. Encouraged home BP monitoring. If hypotension continues, especially if dizzy with lower BP, recommend decreasing losartan at that time. Further recommendations if indicated pending labs.   HLD, LDL goal <70 --Continue atorvastatin 80mg  daily for aggressive risk factor modification.   Elevated A1C --Establish with a PCP. Ongoing glycemic control recommended. Consider as etiology of bilateral paresthesias.   Barriers to care --Will reach out to social work as above.   Medication changes: None Labs ordered: BMET, CBC Studies / Imaging ordered: None. Will contact SW.  Establish with EP once finances / insurance allows. Send office notes? Needs to establish with EP if finances / coverage allows.  Future considerations: Escalate GDMT once finances and BP allow.  Disposition: RTC 1 month    Lennon Alstrom, PA-C 11/10/2020

## 2020-11-11 LAB — BASIC METABOLIC PANEL
BUN/Creatinine Ratio: 17 (ref 9–20)
BUN: 16 mg/dL (ref 6–24)
CO2: 21 mmol/L (ref 20–29)
Calcium: 8.1 mg/dL — ABNORMAL LOW (ref 8.7–10.2)
Chloride: 98 mmol/L (ref 96–106)
Creatinine, Ser: 0.93 mg/dL (ref 0.76–1.27)
GFR calc Af Amer: 112 mL/min/{1.73_m2} (ref >59)
GFR calc non Af Amer: 97 mL/min/{1.73_m2} (ref >59)
Glucose: 463 mg/dL — ABNORMAL HIGH (ref 65–99)
Potassium: 4.5 mmol/L (ref 3.5–5.2)
Sodium: 134 mmol/L (ref 134–144)

## 2020-11-11 LAB — CBC
Hematocrit: 41.5 % (ref 37.5–51.0)
Hemoglobin: 13.2 g/dL (ref 13.0–17.7)
MCH: 28.2 pg (ref 26.6–33.0)
MCHC: 31.8 g/dL (ref 31.5–35.7)
MCV: 89 fL (ref 79–97)
Platelets: 289 10*3/uL (ref 150–450)
RBC: 4.68 x10E6/uL (ref 4.14–5.80)
RDW: 13.6 % (ref 11.6–15.4)
WBC: 4.5 10*3/uL (ref 3.4–10.8)

## 2020-12-11 ENCOUNTER — Ambulatory Visit (INDEPENDENT_AMBULATORY_CARE_PROVIDER_SITE_OTHER): Payer: Self-pay | Admitting: Cardiology

## 2020-12-11 ENCOUNTER — Encounter: Payer: Self-pay | Admitting: Cardiology

## 2020-12-11 ENCOUNTER — Other Ambulatory Visit: Payer: Self-pay

## 2020-12-11 ENCOUNTER — Other Ambulatory Visit: Payer: Self-pay | Admitting: Cardiology

## 2020-12-11 VITALS — BP 130/96 | HR 90 | Ht 75.0 in | Wt 183.0 lb

## 2020-12-11 DIAGNOSIS — Z95810 Presence of automatic (implantable) cardiac defibrillator: Secondary | ICD-10-CM

## 2020-12-11 DIAGNOSIS — I10 Essential (primary) hypertension: Secondary | ICD-10-CM

## 2020-12-11 DIAGNOSIS — E7800 Pure hypercholesterolemia, unspecified: Secondary | ICD-10-CM

## 2020-12-11 DIAGNOSIS — I428 Other cardiomyopathies: Secondary | ICD-10-CM

## 2020-12-11 DIAGNOSIS — F17200 Nicotine dependence, unspecified, uncomplicated: Secondary | ICD-10-CM

## 2020-12-11 MED ORDER — METOPROLOL SUCCINATE ER 100 MG PO TB24
100.0000 mg | ORAL_TABLET | Freq: Every day | ORAL | 5 refills | Status: DC
Start: 2020-12-11 — End: 2020-12-11

## 2020-12-11 NOTE — Patient Instructions (Signed)
Medication Instructions:  Your physician has recommended you make the following change in your medication:   INCREASE Metoprolol to 100 mg daily. An Rx has been sent to your pharmacy.  *If you need a refill on your cardiac medications before your next appointment, please call your pharmacy*   Lab Work: None ordered If you have labs (blood work) drawn today and your tests are completely normal, you will receive your results only by: Marland Kitchen MyChart Message (if you have MyChart) OR . A paper copy in the mail If you have any lab test that is abnormal or we need to change your treatment, we will call you to review the results.   Testing/Procedures: None ordered   Follow-Up: At [facility], you and your health needs are our priority.  As part of our continuing mission to provide you with exceptional heart care, we have created designated Provider Care Teams.  These Care Teams include your primary Cardiologist (physician) and Advanced Practice Providers (APPs -  Physician Assistants and Nurse Practitioners) who all work together to provide you with the care you need, when you need it.  We recommend signing up for the patient portal called "MyChart".  Sign up information is provided on this After Visit Summary.  MyChart is used to connect with patients for Virtual Visits (Telemedicine).  Patients are able to view lab/test results, encounter notes, upcoming appointments, etc.  Non-urgent messages can be sent to your provider as well.   To learn more about what you can do with MyChart, go to [email or web address].    Your next appointment:   4 week(s)  The format for your next appointment:   In Person  Provider:   Debbe Odea, MD   Other Instructions You have been referred to EP Dr. Lalla Brothers for management of your ICD device.

## 2020-12-11 NOTE — Progress Notes (Signed)
Cardiology Office Note:    Date:  12/11/2020   ID:  Travis Lucas, DOB [birth year], MRN [id number]  PCP:  Patient, No Pcp Per  [facility] Cardiologist:  Debbe Odea, MD  [facility] Electrophysiologist:  None   Referring MD: No ref. provider found   Chief Complaint  Patient presents with  . Follow-up    1 month      History of Present Illness:    Travis Lucas is a 50 y.o. male with a hx of  NICM, EF < 20% s/p AICD 08/2019 ([employer]), hypertension, hyperlipidemia, current smoker presenting for follow-up.  Patient seen by myself in the hospital 2 months ago after being admitted for shortness of breath and acute heart failure secondary to medication noncompliance.  Recently moved to the area from [place].  Was managed with IV diuresis eventually switched to p.o.  Placed on Toprol-XL, losartan, Aldactone upon discharge.  Insurance issues preventing initiation of Entresto.  He is tolerating current medicines, just applied for insurance, waiting for response.  Has not check AICD for about a year and a half now.  Prior notes Left heart cath over in [place] likely [facility] with no CAD. AICD placed 2020 [employer].  Past Medical History:  Diagnosis Date  . BOOP (bronchiolitis obliterans with organizing pneumonia) (HCC)   . CHF (congestive heart failure) (HCC)   . Chronic back pain   . Diabetes mellitus without complication (HCC)   . Hyperlipidemia   . Hypertension     Past Surgical History:  Procedure Laterality Date  . CARDIAC DEFIBRILLATOR PLACEMENT  09/10/2019  . CHOLECYSTECTOMY      Current Medications: Current Meds  Medication Sig  . atorvastatin (LIPITOR) 80 MG tablet Take 1 tablet (80 mg total) by mouth daily.  . furosemide (LASIX) 40 MG tablet Take 1 tablet (40 mg total) by mouth 2 (two) times daily.  . insulin glargine (LANTUS) 100 UNIT/ML Solostar Pen Inject 15 Units into the skin 2 (two) times daily.  . Insulin Pen Needle (PEN NEEDLES) 31G X 5 MM  MISC 15 Units by Does not apply route 2 (two) times daily.  Marland Kitchen lidocaine (LIDODERM) 5 % Place 1 patch onto the skin daily. Remove & Discard patch within 12 hours or as directed by MD  . losartan (COZAAR) 25 MG tablet Take 1 tablet (25 mg total) by mouth daily.  . naproxen sodium (ALEVE) 220 MG tablet Take 220 mg by mouth as needed.  Marland Kitchen spironolactone (ALDACTONE) 25 MG tablet Take 1 tablet (25 mg total) by mouth daily.  . [DISCONTINUED] metoprolol succinate (TOPROL-XL) 50 MG 24 hr tablet Take 1 tablet (50 mg total) by mouth daily. Take with or immediately following a meal.     Allergies:   Penicillins   Social History   Socioeconomic History  . Marital status: Single    Spouse name: Not on file  . Number of children: Not on file  . Years of education: Not on file  . Highest education level: Not on file  Occupational History  . Not on file  Tobacco Use  . Smoking status: Current Some Day Smoker    Types: Cigarettes  . Smokeless tobacco: Never Used  . Tobacco comment: 1 cigarrette a day   Vaping Use  . Vaping Use: Never used  Substance and Sexual Activity  . Alcohol use: Yes    Comment: "socially"   . Drug use: Yes    Types: Marijuana  . Sexual activity: Not on file  Other  Topics Concern  . Not on file  Social History Narrative  . Not on file   Social Determinants of Health   Financial Resource Strain: Not on file  Food Insecurity: Not on file  Transportation Needs: Not on file  Physical Activity: Not on file  Stress: Not on file  Social Connections: Not on file     Family History: The patient's family history is not on file.  ROS:   Please see the history of present illness.     All other systems reviewed and are negative.  EKGs/Labs/Other Studies Reviewed:    The following studies were reviewed today:   EKG:  EKG not ordered today.    Recent Labs: 10/26/2020: B Natriuretic Peptide 1,396.1 10/27/2020: TSH 2.707 10/28/2020: ALT 45 11/10/2020: BUN 16;  Creatinine, Ser 0.93; Hemoglobin 13.2; Platelets 289; Potassium 4.5; Sodium 134  Recent Lipid Panel No results found for: CHOL, TRIG, HDL, CHOLHDL, VLDL, LDLCALC, LDLDIRECT   Risk Assessment/Calculations:      Physical Exam:    VS:  BP (!) 130/96   Pulse 90   Ht 6\' 3"  (1.905 m)   Wt 183 lb (83 kg)   BMI 22.87 kg/m     Wt Readings from Last 3 Encounters:  12/11/20 183 lb (83 kg)  11/10/20 169 lb 8 oz (76.9 kg)  11/05/20 162 lb 6 oz (73.7 kg)     GEN:  Well nourished, well developed in no acute distress HEENT: Normal NECK: No JVD; No carotid bruits LYMPHATICS: No lymphadenopathy CARDIAC: RRR, no murmurs, rubs, gallops RESPIRATORY:  Clear to auscultation without rales, wheezing or rhonchi  ABDOMEN: Soft, non-tender, non-distended MUSCULOSKELETAL:  No edema; No deformity  SKIN: Warm and dry NEUROLOGIC:  Alert and oriented x 3 PSYCHIATRIC:  Normal affect   ASSESSMENT:    1. NICM (nonischemic cardiomyopathy) (HCC)   2. S/P implantation of automatic cardioverter/defibrillator (AICD)   3. Primary hypertension   4. Pure hypercholesterolemia   5. Smoking    PLAN:    In order of problems listed above:  1. Nonischemic cardiomyopathy, EF less than 20%.  NYHA class II symptoms, appears euvolemic.  Heart rate 90.  Increase Toprol-XL to 100 mg daily, continue losartan, Aldactone, Lasix.  If BP permits at follow-up visit, titrate losartan. 2. AICD implant.  Schedule appointment with EP/device clinic for monitoring. 3. Hypertension, BP controlled.  Continue current meds as above. 4. Hyperlipidemia, continue Lipitor. 5. Current smoker, smoking cessation recommended.  Follow-up in 1 month.  Hopefully insurance application goal, at which point 14/09/21 can be initiated.    Medication Adjustments/Labs and Tests Ordered: Current medicines are reviewed at length with the patient today.  Concerns regarding medicines are outlined above.  Orders Placed This Encounter  Procedures  .  Ambulatory referral to Cardiac Electrophysiology   Meds ordered this encounter  Medications  . metoprolol succinate (TOPROL-XL) 100 MG 24 hr tablet    Sig: Take 1 tablet (100 mg total) by mouth daily. Take with or immediately following a meal.    Dispense:  30 tablet    Refill:  5    Dosage increase    Patient Instructions  Medication Instructions:  Your physician has recommended you make the following change in your medication:   INCREASE Metoprolol to 100 mg daily. An Rx has been sent to your pharmacy.  *If you need a refill on your cardiac medications before your next appointment, please call your pharmacy*   Lab Work: None ordered If you have labs (blood  work) drawn today and your tests are completely normal, you will receive your results only by: Marland Kitchen MyChart Message (if you have MyChart) OR . A paper copy in the mail If you have any lab test that is abnormal or we need to change your treatment, we will call you to review the results.   Testing/Procedures: None ordered   Follow-Up: At [facility], you and your health needs are our priority.  As part of our continuing mission to provide you with exceptional heart care, we have created designated Provider Care Teams.  These Care Teams include your primary Cardiologist (physician) and Advanced Practice Providers (APPs -  Physician Assistants and Nurse Practitioners) who all work together to provide you with the care you need, when you need it.  We recommend signing up for the patient portal called "MyChart".  Sign up information is provided on this After Visit Summary.  MyChart is used to connect with patients for Virtual Visits (Telemedicine).  Patients are able to view lab/test results, encounter notes, upcoming appointments, etc.  Non-urgent messages can be sent to your provider as well.   To learn more about what you can do with MyChart, go to [email or web address].    Your next appointment:   4 week(s)  The format  for your next appointment:   In Person  Provider:   Debbe Odea, MD   Other Instructions You have been referred to EP Dr. Lalla Brothers for management of your ICD device.      Signed, Debbe Odea, MD  12/11/2020 4:54 PM    [facility]

## 2020-12-15 NOTE — Progress Notes (Deleted)
Patient ID: Travis Lucas, male    DOB: 08-31-1971, 50 y.o.   MRN: [id number]  HPI  Travis Lucas is a 50 y/o male with a history of DM, HTN, BOOP, hyperlipidemia, current tobacco use and chronic heart failure.   Echo report from 10/27/20 reviewed and showed an EF of <20% along with moderate Travis and moderately elevated PA pressure.   Admitted 10/26/20 due to acute on chronic HF. Cardiology consult obtained. Initially given IV lasix with resultant loss of 10L with transition to oral diuretics. Discharged after 7 days.   He presents today for a follow-up visit with a chief complaint of   Does not have any scales. Was living in [place] and then became homeless. Currently living here locally with a sister and is in the process of trying to get his [place] medicaid transferred to [facility]. Mentions wanting to find an indoor pool to work his muscles. When he was in [place], he was very active playing sports and lifting weights and subsequently has lost a lot of muscle mass.   Past Medical History:  Diagnosis Date  . BOOP (bronchiolitis obliterans with organizing pneumonia) (HCC)   . CHF (congestive heart failure) (HCC)   . Chronic back pain   . Diabetes mellitus without complication (HCC)   . Hyperlipidemia   . Hypertension    Past Surgical History:  Procedure Laterality Date  . CARDIAC DEFIBRILLATOR PLACEMENT  09/10/2019  . CHOLECYSTECTOMY     No family history on file. Social History   Tobacco Use  . Smoking status: Current Some Day Smoker    Types: Cigarettes  . Smokeless tobacco: Never Used  . Tobacco comment: 1 cigarrette a day   Substance Use Topics  . Alcohol use: Yes    Comment: "socially"    Allergies  Allergen Reactions  . Penicillins      Review of Systems  Constitutional: Positive for fatigue. Negative for appetite change.  HENT: Negative for congestion, rhinorrhea and sore throat.   Eyes: Negative.   Respiratory: Positive for shortness of breath ("improving"). Negative for  cough.   Cardiovascular: Negative for chest pain, palpitations and leg swelling.  Gastrointestinal: Negative for abdominal distention and abdominal pain.  Endocrine: Negative.   Genitourinary: Negative.   Musculoskeletal: Negative for back pain and neck pain.  Skin: Negative.   Allergic/Immunologic: Negative.   Neurological: Positive for numbness ("at times"). Negative for dizziness and light-headedness.  Hematological: Negative for adenopathy. Does not bruise/bleed easily.  Psychiatric/Behavioral: Positive for sleep disturbance (has taken remeron in the past). Negative for dysphoric mood. The patient is not nervous/anxious.     Physical Exam Vitals and nursing note reviewed.  Constitutional:      Appearance: He is well-developed.  HENT:     Head: Normocephalic and atraumatic.  Neck:     Vascular: No JVD.  Cardiovascular:     Rate and Rhythm: Normal rate and regular rhythm.  Pulmonary:     Effort: Pulmonary effort is normal. No respiratory distress.     Breath sounds: No wheezing or rales.  Abdominal:     Palpations: Abdomen is soft.     Tenderness: There is no abdominal tenderness.  Musculoskeletal:     Cervical back: Neck supple.     Right lower leg: No tenderness. Edema (around right ankle) present.     Left lower leg: No tenderness. Edema (around ankle) present.  Skin:    General: Skin is warm and dry.  Neurological:     General: No  focal deficit present.     Mental Status: He is alert and oriented to person, place, and time.  Psychiatric:        Mood and Affect: Mood normal.        Behavior: Behavior normal.    Assessment & Plan:  1: Chronic heart failure with reduced ejection fraction- - NYHA class II - euvolemic today - weighing daily; reminded to call for an overnight weight gain of > 2 pounds or a weekly weight gain of >5 pounds - weight 162.6 from last visit here 6 weeks ago - not adding salt and has started to read food labels for sodium content - saw  cardiology (Agbor-Etang) 12/11/20 - consider changing his losartan to entresto, titrating up metoprolol and adding either farxiga/ jardiance  - has AICD (placed Oct 2020) - BNP 10/26/20 was 1396.1  2: HTN- - BP  - needs to get established with [facility] as he's currently uninsured; trying to get his [place] medicaid transferred to [facility] - BMP 11/10/20 reviewed and showed sodium 134, potassium 4.5, creatinine 0.93 and GFR 112  3: DM- - A1c 10/27/20 was 11.9%  4: Tobacco use- - smoking 1 cigarette daily   Medication bottles reviewed.

## 2020-12-17 ENCOUNTER — Telehealth: Payer: Self-pay | Admitting: Family

## 2020-12-17 ENCOUNTER — Ambulatory Visit: Payer: Self-pay | Admitting: Family

## 2020-12-17 NOTE — Telephone Encounter (Signed)
Patient did not show for his Heart Failure Clinic appointment on 12/17/20. Will attempt to reschedule.

## 2020-12-28 ENCOUNTER — Other Ambulatory Visit: Payer: Self-pay

## 2020-12-28 ENCOUNTER — Ambulatory Visit: Payer: Self-pay | Admitting: Pharmacy Technician

## 2020-12-28 DIAGNOSIS — Z79899 Other long term (current) drug therapy: Secondary | ICD-10-CM

## 2020-12-28 NOTE — Progress Notes (Signed)
Completed [facility] application and contract.  Patient agreed to all terms of the [facility] contract.    Patient approved to receive medication assistance at [facility] until time for re-certification in 2023, and as long as eligibility criteria continues to be met.    Provided patient with community resource material based on his particular needs.    Travis Lucas J. Makailee Nudelman Care Manager [facility]  

## 2020-12-30 ENCOUNTER — Other Ambulatory Visit: Payer: Self-pay

## 2020-12-30 NOTE — Progress Notes (Deleted)
  Medication Management Clinic Visit Note  Patient: Travis Lucas MRN: [id number] Date of Birth: 18-Dec-1970 PCP: Patient, No Pcp Per   Travis Lucas 50 y.o. male presents for a telephone visit for medication management today. Verified patient with two identifiers.   There were no vitals taken for this visit.  Patient Information   Past Medical History:  Diagnosis Date  . BOOP (bronchiolitis obliterans with organizing pneumonia) (HCC)   . CHF (congestive heart failure) (HCC)   . Chronic back pain   . Diabetes mellitus without complication (HCC)   . Hyperlipidemia   . Hypertension       Past Surgical History:  Procedure Laterality Date  . CARDIAC DEFIBRILLATOR PLACEMENT  09/10/2019  . CHOLECYSTECTOMY      No family history on file.  New Diagnoses (since last visit):   Family Support: {Family Support:210800003}  Lifestyle Diet: Breakfast:*** Lunch:*** Dinner:*** Drinks:***            Social History   Substance and Sexual Activity  Alcohol Use Yes   Comment: "socially"       Social History   Tobacco Use  Smoking Status Current Some Day Smoker  . Types: Cigarettes  Smokeless Tobacco Never Used  Tobacco Comment   1 cigarrette a day       Health Maintenance  Topic Date Due  . Hepatitis C Screening  Never done  . PNEUMOCOCCAL POLYSACCHARIDE VACCINE AGE 82-64 HIGH RISK  Never done  . COVID-19 Vaccine (1) Never done  . FOOT EXAM  Never done  . OPHTHALMOLOGY EXAM  Never done  . TETANUS/TDAP  Never done  . COLONOSCOPY (Pts 45-19yrs Insurance coverage will need to be confirmed)  Never done  . INFLUENZA VACCINE  Never done  . HEMOGLOBIN A1C  04/26/2021  . HIV Screening  Completed   Health Maintenance/Date Completed  Last ED visit: 10/26/20 Last Visit to PCP: 12/11/20 with cardiologist  Next Visit to PCP: 01/06/21 Specialist Visit: 01/08/21 - HF clinic Dental Exam: *** Eye Exam: *** Prostate Exam: *** Colonoscopy: *** Flu Vaccine: *** Pneumonia  Vaccine: *** COVID-19 Vaccine: ***   Assessment and Plan: Diabetes 10/27/20 A1c 11.9% - non compliance?  HFrEF s/p AICD 08/2019  Did not show for his HF appt on 12/17/20 Insurance issues preventing initiation of entresto 12/11/20 Toprol-XL increased to 100mg  daily Was in the hospital 10/26/20 for HF exacerbation d/t noncompliance  HLD   Access/Adherence    10/28/20, PharmD Pharmacy Resident  12/30/2020 8:01 AM

## 2021-01-06 ENCOUNTER — Encounter: Payer: Self-pay | Admitting: Cardiology

## 2021-01-06 ENCOUNTER — Other Ambulatory Visit: Payer: Self-pay | Admitting: Adult Health

## 2021-01-06 ENCOUNTER — Other Ambulatory Visit: Payer: Self-pay

## 2021-01-06 ENCOUNTER — Ambulatory Visit: Payer: Self-pay | Admitting: Adult Health

## 2021-01-06 ENCOUNTER — Encounter: Payer: Self-pay | Admitting: Adult Health

## 2021-01-06 VITALS — BP 138/92 | HR 93 | Temp 98.1°F | Resp 16 | Wt 180.1 lb

## 2021-01-06 DIAGNOSIS — I110 Hypertensive heart disease with heart failure: Secondary | ICD-10-CM

## 2021-01-06 DIAGNOSIS — E785 Hyperlipidemia, unspecified: Secondary | ICD-10-CM

## 2021-01-06 DIAGNOSIS — E1165 Type 2 diabetes mellitus with hyperglycemia: Secondary | ICD-10-CM

## 2021-01-06 DIAGNOSIS — Z0000 Encounter for general adult medical examination without abnormal findings: Secondary | ICD-10-CM

## 2021-01-06 DIAGNOSIS — I255 Ischemic cardiomyopathy: Secondary | ICD-10-CM

## 2021-01-06 DIAGNOSIS — J8489 Other specified interstitial pulmonary diseases: Secondary | ICD-10-CM

## 2021-01-06 DIAGNOSIS — I5042 Chronic combined systolic (congestive) and diastolic (congestive) heart failure: Secondary | ICD-10-CM

## 2021-01-06 LAB — GLUCOSE, POCT (MANUAL RESULT ENTRY): POC Glucose: 369 mg/dl — AB (ref 70–99)

## 2021-01-06 MED ORDER — BLOOD GLUCOSE MONITOR KIT: PACK | 0 refills | Status: AC

## 2021-01-06 MED ORDER — GLIPIZIDE ER 2.5 MG PO TB24: ORAL_TABLET | ORAL | 2 refills | Status: DC

## 2021-01-06 MED ORDER — INSULIN GLARGINE 100 UNIT/ML SOLOSTAR PEN: 20.0000 [IU] | PEN_INJECTOR | Freq: Two times a day (BID) | SUBCUTANEOUS | 11 refills | Status: DC

## 2021-01-06 MED ORDER — VITAMIN D (ERGOCALCIFEROL) 1.25 MG (50000 UNIT) PO CAPS: 50000.0000 [IU] | ORAL_CAPSULE | ORAL | 1 refills | Status: DC

## 2021-01-06 MED ORDER — VITAMIN B-12 1000 MCG PO TABS: 1000.0000 ug | ORAL_TABLET | Freq: Every day | ORAL | 1 refills | Status: AC

## 2021-01-06 NOTE — Progress Notes (Signed)
Patient ID: Travis Lucas, male   DOB: 09-02-1971, 50 y.o.   MRN: [id number]  No chief complaint on file.   HPI Travis Lucas is a 50 y.o. male with a h/o T2DM, CAD, ischemic cardiomyopathy s/p AICD placement, Combined D/S Heart failure and hypertensive heart disease who presents for an initial office visit and for evaluation of chronic health problems. He recently moved here from [place] in October 2021. Prior to moving here, he was seeing the PCP at the health department there. Today he offers no complaints except for "generalized sore muscles". He is a English as a second language teacher but is not able to get services from the [place]. He is working on getting his Discharge upgraded so he can get services. When he got here, he was homeless and was not getting any healthcare. He ended up in the hospital. He currently lives with his sister and his sisters are helping with his living expenses.Marland Kitchen He is currently checking his blood glucose about once a day. Readings reading are between 95-102 mg/dl fasting. He is taking all medications as prescribed. BP readings at home are between 150/90 HR in the 90s. He reports no respiratory symptoms. He is being followed by cardiology at [facility]. He denies chest pain, palpitations, dyspnea, edema and orthopnea. He reports poor sleep quality but states that prayers do help him relax and sleep. He continues to smoke but expresses desire to quit. He reports compliance with fluids and salt restrictions.   Past Medical History:  Diagnosis Date  . BOOP (bronchiolitis obliterans with organizing pneumonia) ([place])   . CHF (congestive heart failure) ([place])   . Chronic back pain   . Diabetes mellitus without complication ([place])   . Hyperlipidemia   . Hypertension     Past Surgical History:  Procedure Laterality Date  . CARDIAC DEFIBRILLATOR PLACEMENT  09/10/2019  . CHOLECYSTECTOMY      History reviewed. No pertinent family history.  Social History Social History   Tobacco Use  . Smoking status:  Current Some Day Smoker    Types: Cigarettes  . Smokeless tobacco: Never Used  . Tobacco comment: 1 cigarrette a day   Vaping Use  . Vaping Use: Never used  Substance Use Topics  . Alcohol use: Yes    Comment: "socially"   . Drug use: Yes    Types: Marijuana    Allergies  Allergen Reactions  . Penicillins     Current Outpatient Medications  Medication Sig Dispense Refill  . atorvastatin (LIPITOR) 80 MG tablet Take 1 tablet (80 mg total) by mouth daily. 90 tablet 1  . blood glucose meter kit and supplies KIT Dispense based on patient and insurance preference. Use up to four times daily as directed. (FOR ICD-9 250.00, 250.01). 1 each 0  . furosemide (LASIX) 40 MG tablet Take 1 tablet (40 mg total) by mouth 2 (two) times daily. 60 tablet 2  . glipiZIDE (GLUCOTROL XL) 2.5 MG 24 hr tablet Take one tablet with lunch 30 tablet 2  . Insulin Pen Needle (PEN NEEDLES) 31G X 5 MM MISC 15 Units by Does not apply route 2 (two) times daily. 100 each 6  . lidocaine (LIDODERM) 5 % Place 1 patch onto the skin daily. Remove & Discard patch within 12 hours or as directed by MD 30 patch 0  . losartan (COZAAR) 25 MG tablet Take 1 tablet (25 mg total) by mouth daily. 90 tablet 1  . metoprolol succinate (TOPROL-XL) 100 MG 24 hr tablet Take 1 tablet (100 mg total) by  mouth daily. Take with or immediately following a meal. 30 tablet 5  . spironolactone (ALDACTONE) 25 MG tablet Take 1 tablet (25 mg total) by mouth daily. 90 tablet 1  . vitamin B-12 (CYANOCOBALAMIN) 1000 MCG tablet Take 1 tablet (1,000 mcg total) by mouth daily. 90 tablet 1  . Vitamin D, Ergocalciferol, (DRISDOL) 1.25 MG (50000 UNIT) CAPS capsule Take 1 capsule (50,000 Units total) by mouth every 7 (seven) days. 5 capsule 1  . insulin glargine (LANTUS) 100 UNIT/ML Solostar Pen Inject 20 Units into the skin 2 (two) times daily. 15 mL 11  . naproxen sodium (ALEVE) 220 MG tablet Take 220 mg by mouth as needed. (Patient not taking: Reported on  01/06/2021)     No current facility-administered medications for this visit.    Review of Systems Review of Systems  Constitutional: Negative.   HENT: Negative.   Eyes: Negative.   Respiratory: Negative for chest tightness and shortness of breath.   Gastrointestinal: Negative for abdominal distention, constipation, diarrhea and nausea.  Endocrine: Negative for polydipsia, polyphagia and polyuria.  Genitourinary: Negative.   Musculoskeletal: Negative.   Skin: Positive for color change (venous stasis discoloration).  Allergic/Immunologic: Negative.   Neurological: Positive for numbness (BLLE). Negative for dizziness, weakness and headaches.  Hematological: Negative.   Psychiatric/Behavioral: Negative.     Blood pressure (!) 138/92, pulse 93, temperature 98.1 F (36.7 C), resp. rate 16, weight 180 lb 1.6 oz (81.7 kg), SpO2 99 %.  Physical Exam Physical Exam Vitals and nursing note reviewed.  Constitutional:      General: He is not in acute distress.    Appearance: Normal appearance. He is normal weight.  HENT:     Head: Normocephalic and atraumatic.     Right Ear: External ear normal.     Left Ear: External ear normal.     Nose: Nose normal.     Mouth/Throat:     Mouth: Mucous membranes are moist.     Pharynx: Oropharynx is clear.  Eyes:     Extraocular Movements: Extraocular movements intact.     Conjunctiva/sclera: Conjunctivae normal.     Pupils: Pupils are equal, round, and reactive to light.  Cardiovascular:     Rate and Rhythm: Normal rate and regular rhythm.     Pulses: Normal pulses.     Heart sounds: Normal heart sounds.  Pulmonary:     Effort: Pulmonary effort is normal.     Breath sounds: Normal breath sounds.  Chest:     Chest wall: No tenderness.  Abdominal:     General: Abdomen is flat. Bowel sounds are normal. There is no distension.     Palpations: Abdomen is soft.     Tenderness: There is no abdominal tenderness.  Musculoskeletal:        General:  Normal range of motion.     Cervical back: Normal range of motion and neck supple.  Skin:    General: Skin is warm.     Capillary Refill: Capillary refill takes less than 2 seconds.     Coloration: Skin is not jaundiced.     Findings: No bruising.  Neurological:     General: No focal deficit present.     Mental Status: He is alert. Mental status is at baseline.  Psychiatric:        Mood and Affect: Mood normal.        Thought Content: Thought content normal.        Judgment: Judgment normal.  Data Reviewed Labs reviewed from previous hospitalization  Assessment and Plan 1. Health maintenance examination Health exam with evident chronic disease complications. Life style modification reinforced. Will obtain routine labs and screen for routine health problems and complications.  - Lipid Profile - HgB A1c - Urinalysis, Routine w reflex microscopic - Urine Microalbumin w/creat. ratio - Comp Met (CMET) - PSA - TSH - Ambulatory referral to Smoking Cessation Program  2. Hypertensive heart disease with heart failure (HCC) Moderate BP control. Patient states that metoprolol was recently increased to 145m daily. Will continue current regimen. Patient advised to monitor BP daily and call clinic with results in one week. He is to continue taking all medications as prescribed. Patient advised to monitor salt intake and limit daily requirements to 2 grams daily.  3. BOOP (bronchiolitis obliterans with organizing pneumonia) ([place] controlled. Not currently on any medications. Continue to monitor  4. Chronic combined systolic and diastolic heart failure (HCC) Appears euvolemic. Continue furosemide and beta blocker  5. Uncontrolled type 2 diabetes mellitus with hyperglycemia (HCC) Persistent hyperglycemia. Will increase lantus to 20 units bid from 15 unit bid AND ADD GLIPIZIDE 2.523mdaily with lunch. S/SX of hypo/hyperglycemia reviewed - POCT Glucose (CBG)-369 MG/DL  6.  Hyperlipidemia associated with type 2 diabetes mellitus (HCScottContinue current statin dose. Will repeat lipid panel today  7. Ischemic cardiomyopathy s/p AICD placement. Patient is being followed by cardiology. Patient advised to keep all cardiology appointments. ED precautions reviewed   Magddalene S Tukov-Yual 01/06/2021, 3:27 PM

## 2021-01-07 LAB — LIPID PANEL
Chol/HDL Ratio: 2.3 ratio (ref 0.0–5.0)
Cholesterol, Total: 136 mg/dL (ref 100–199)
HDL: 59 mg/dL (ref >39)
LDL Chol Calc (NIH): 60 mg/dL (ref 0–99)
Triglycerides: 91 mg/dL (ref 0–149)
VLDL Cholesterol Cal: 17 mg/dL (ref 5–40)

## 2021-01-07 LAB — HEMOGLOBIN A1C
Est. average glucose Bld gHb Est-mCnc: 283 mg/dL
Hgb A1c MFr Bld: 11.5 % — ABNORMAL HIGH (ref 4.8–5.6)

## 2021-01-07 LAB — COMPREHENSIVE METABOLIC PANEL
ALT: 26 IU/L (ref 0–44)
AST: 27 IU/L (ref 0–40)
Albumin/Globulin Ratio: 1.4 (ref 1.2–2.2)
Albumin: 3.7 g/dL — ABNORMAL LOW (ref 4.0–5.0)
Alkaline Phosphatase: 177 IU/L — ABNORMAL HIGH (ref 44–121)
BUN/Creatinine Ratio: 10 (ref 9–20)
BUN: 8 mg/dL (ref 6–24)
Bilirubin Total: 0.7 mg/dL (ref 0.0–1.2)
CO2: 25 mmol/L (ref 20–29)
Calcium: 8.7 mg/dL (ref 8.7–10.2)
Chloride: 96 mmol/L (ref 96–106)
Creatinine, Ser: 0.82 mg/dL (ref 0.76–1.27)
GFR calc Af Amer: 121 mL/min/{1.73_m2} (ref >59)
GFR calc non Af Amer: 105 mL/min/{1.73_m2} (ref >59)
Globulin, Total: 2.6 g/dL (ref 1.5–4.5)
Glucose: 442 mg/dL — ABNORMAL HIGH (ref 65–99)
Potassium: 3.7 mmol/L (ref 3.5–5.2)
Sodium: 137 mmol/L (ref 134–144)
Total Protein: 6.3 g/dL (ref 6.0–8.5)

## 2021-01-07 LAB — TSH: TSH: 2.55 u[IU]/mL (ref 0.450–4.500)

## 2021-01-07 LAB — URINALYSIS, ROUTINE W REFLEX MICROSCOPIC
Bilirubin, UA: NEGATIVE
Ketones, UA: NEGATIVE
Leukocytes,UA: NEGATIVE
Nitrite, UA: NEGATIVE
Specific Gravity, UA: 1.015 (ref 1.005–1.030)
Urobilinogen, Ur: 1 mg/dL (ref 0.2–1.0)
pH, UA: 6.5 (ref 5.0–7.5)

## 2021-01-07 LAB — MICROSCOPIC EXAMINATION
Bacteria, UA: NONE SEEN
Casts: NONE SEEN /lpf
Epithelial Cells (non renal): NONE SEEN /hpf (ref 0–10)
WBC, UA: NONE SEEN /hpf (ref 0–5)

## 2021-01-07 LAB — MICROALBUMIN / CREATININE URINE RATIO
Creatinine, Urine: 46.8 mg/dL
Microalb/Creat Ratio: 1998 mg/g creat — ABNORMAL HIGH (ref 0–29)
Microalbumin, Urine: 935.1 ug/mL

## 2021-01-07 LAB — PSA: Prostate Specific Ag, Serum: 0.2 ng/mL (ref 0.0–4.0)

## 2021-01-08 ENCOUNTER — Ambulatory Visit: Payer: Self-pay | Admitting: Cardiology

## 2021-01-13 ENCOUNTER — Other Ambulatory Visit: Payer: Self-pay | Admitting: Cardiology

## 2021-01-13 ENCOUNTER — Ambulatory Visit (INDEPENDENT_AMBULATORY_CARE_PROVIDER_SITE_OTHER): Payer: Self-pay | Admitting: Cardiology

## 2021-01-13 ENCOUNTER — Other Ambulatory Visit: Payer: Self-pay

## 2021-01-13 VITALS — BP 146/98 | HR 84 | Ht 75.0 in | Wt 181.0 lb

## 2021-01-13 DIAGNOSIS — I428 Other cardiomyopathies: Secondary | ICD-10-CM

## 2021-01-13 DIAGNOSIS — Z95810 Presence of automatic (implantable) cardiac defibrillator: Secondary | ICD-10-CM

## 2021-01-13 DIAGNOSIS — I5042 Chronic combined systolic (congestive) and diastolic (congestive) heart failure: Secondary | ICD-10-CM

## 2021-01-13 MED ORDER — LOSARTAN POTASSIUM 50 MG PO TABS: 50.0000 mg | ORAL_TABLET | Freq: Every day | ORAL | 3 refills | Status: DC

## 2021-01-13 NOTE — Progress Notes (Signed)
Electrophysiology Office Note:    Date:  01/13/2021   ID:  Travis Lucas, DOB 1971-10-19, MRN [id number]  PCP:  Patient, No Pcp Per  [place] Cardiologist:  Kate Sable, MD  [facility] Electrophysiologist:  Vickie Epley, MD   Referring MD: Kate Sable, MD   Chief Complaint: Nonischemic cardiomyopathy, ICD  History of Present Illness:    Travis Lucas is a 50 y.o. male who presents for an evaluation of nonischemic cardiomyopathy at the request of Dr. Garen Lah. 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  Patient was last seen by Dr. Garen Lah December 11, 2020.  He had recently moved to the area from [place].  He had previously had a heart cath at [facility] which showed no coronary artery disease.  At his appointment with Dr. Garen Lah he was thought to be NYHA class II and euvolemic.  He presents to the EP clinic to establish for remote monitoring.  Today he tells me he is doing well. He is working on regaining weight that he lost while he was hospitalized back in [place]. He tells me his typical weight is around 220 but he he was down as low as 150s. He is now get his weight back up to the 170s and is 181 pounds in clinic today. He is tolerating his medications without off target effects. He keeps a check on his blood pressures at home and tells me they typically run where they are today in the 140s over 90s.  His ICD has never shocked him. No trouble with the pocket.   Past Medical History:  Diagnosis Date  . BOOP (bronchiolitis obliterans with organizing pneumonia) ([place])   . CHF (congestive heart failure) ([place])   . Chronic back pain   . Diabetes mellitus without complication ([place])   . Hyperlipidemia   . Hypertension     Past Surgical History:  Procedure Laterality Date  . CARDIAC DEFIBRILLATOR PLACEMENT  09/10/2019  . CHOLECYSTECTOMY      Current  Medications: Current Meds  Medication Sig  . atorvastatin (LIPITOR) 80 MG tablet Take 1 tablet (80 mg total) by mouth daily.  . blood glucose meter kit and supplies KIT Dispense based on patient and insurance preference. Use up to four times daily as directed. (FOR ICD-9 250.00, 250.01).  . furosemide (LASIX) 40 MG tablet Take 1 tablet (40 mg total) by mouth 2 (two) times daily.  Marland Kitchen glipiZIDE (GLUCOTROL XL) 2.5 MG 24 hr tablet Take one tablet with lunch  . insulin glargine (LANTUS) 100 UNIT/ML Solostar Pen Inject 20 Units into the skin 2 (two) times daily.  . Insulin Pen Needle (PEN NEEDLES) 31G X 5 MM MISC 15 Units by Does not apply route 2 (two) times daily.  Marland Kitchen lidocaine (LIDODERM) 5 % Place 1 patch onto the skin daily. Remove & Discard patch within 12 hours or as directed by MD  . losartan (COZAAR) 25 MG tablet Take 1 tablet (25 mg total) by mouth daily.  . metoprolol succinate (TOPROL-XL) 100 MG 24 hr tablet Take 1 tablet (100 mg total) by mouth daily. Take with or immediately following a meal.  . spironolactone (ALDACTONE) 25 MG tablet Take 1 tablet (25 mg total) by mouth daily.  . vitamin B-12 (CYANOCOBALAMIN) 1000 MCG tablet Take 1 tablet (1,000 mcg total) by mouth daily.  . Vitamin D, Ergocalciferol, (DRISDOL) 1.25 MG (50000 UNIT) CAPS capsule Take 1 capsule (50,000 Units total) by mouth every 7 (seven) days.  Allergies:   Penicillins   Social History   Socioeconomic History  . Marital status: Single    Spouse name: Not on file  . Number of children: Not on file  . Years of education: Not on file  . Highest education level: Not on file  Occupational History  . Not on file  Tobacco Use  . Smoking status: Current Some Day Smoker    Types: Cigarettes  . Smokeless tobacco: Never Used  . Tobacco comment: 1 cigarrette a day   Vaping Use  . Vaping Use: Never used  Substance and Sexual Activity  . Alcohol use: Yes    Comment: "socially"   . Drug use: Yes    Types: Marijuana   . Sexual activity: Not on file  Other Topics Concern  . Not on file  Social History Narrative  . Not on file   Social Determinants of Health   Financial Resource Strain: Not on file  Food Insecurity: Not on file  Transportation Needs: Not on file  Physical Activity: Not on file  Stress: Not on file  Social Connections: Not on file     Family History: The patient's family history is not on file.  ROS:   Please see the history of present illness.    All other systems reviewed and are negative.  EKGs/Labs/Other Studies Reviewed:    The following studies were reviewed today:  October 27, 2020 echo personally reviewed Left ventricular function severely reduced, less than 20% Right ventricular function severely reduced Moderately dilated left and right atria Moderate MR  November 10, 2020 EKG  Incomplete right bundle branch block, biatrial enlargement, sinus rhythm  January 13, 2021 device interrogation personally reviewed Lead parameters stable, good battery longevity No ICD therapies  longevity estimated at 7 years Programmed to zones including a VT zone starting at 171 bpm ms for 40 intervals.  There is a monitor only zone.  VF zone starting at 214 bpm with ATP during charge followed by max output shocks.   Recent Labs: 10/26/2020: B Natriuretic Peptide 1,396.1 11/10/2020: Hemoglobin 13.2; Platelets 289 01/06/2021: ALT 26; BUN 8; Creatinine, Ser 0.82; Potassium 3.7; Sodium 137; TSH 2.550  Recent Lipid Panel    Component Value Date/Time   CHOL 136 01/06/2021 1257   TRIG 91 01/06/2021 1257   HDL 59 01/06/2021 1257   CHOLHDL 2.3 01/06/2021 1257   LDLCALC 60 01/06/2021 1257    Physical Exam:    VS:  BP (!) 146/98 (BP Location: Left Arm, Patient Position: Sitting, Cuff Size: Normal)   Pulse 84   Ht 6' 3"  (1.905 m)   Wt 181 lb (82.1 kg)   SpO2 98%   BMI 22.62 kg/m     Wt Readings from Last 3 Encounters:  01/13/21 181 lb (82.1 kg)  01/06/21 180 lb 1.6 oz  (81.7 kg)  12/11/20 183 lb (83 kg)     GEN:  Well nourished, well developed in no acute distress HEENT: Normal NECK: No JVD; No carotid bruits LYMPHATICS: No lymphadenopathy CARDIAC: RRR, no murmurs, rubs, gallops. Small keloid over the incision site. Otherwise well-healed. Nonpainful. RESPIRATORY:  Clear to auscultation without rales, wheezing or rhonchi  ABDOMEN: Soft, non-tender, non-distended MUSCULOSKELETAL:  No edema; No deformity  SKIN: Warm and dry NEUROLOGIC:  Alert and oriented x 3 PSYCHIATRIC:  Normal affect   ASSESSMENT:    1. Chronic combined systolic and diastolic heart failure ([place])   2. NICM (nonischemic cardiomyopathy) ([place])   3. S/P implantation of automatic cardioverter/defibrillator (  AICD)    PLAN:    In order of problems listed above:  1. Chronic combined systolic and diastolic heart failure post ICD implant NYHA class II symptoms.  Warm and euvolemic on my exam today. We will establish him in our device clinic for routine remote monitoring. Continue Lasix, losartan, metoprolol, spironolactone. Today I will increase the dose of his losartan to 50 mg once daily given his blood pressure is above goal in the 140s with an ejection fraction less than 20%. He will continue to follow-up with Dr. Garen Lah for further medication titration.  Follow-up 1 year or sooner as needed.   Medication Adjustments/Labs and Tests Ordered: Current medicines are reviewed at length with the patient today.  Concerns regarding medicines are outlined above.  No orders of the defined types were placed in this encounter.  No orders of the defined types were placed in this encounter.    Signed, Lars Mage, MD, [facility]  01/13/2021 8:49 AM    Electrophysiology [place]

## 2021-01-13 NOTE — Patient Instructions (Addendum)
Medication Instructions:  Your physician has recommended you make the following change in your medication:  1.  Increase your losartan--Take 50 mg by mouth once a day.  You may take 2 tablets of your losartan 25 mg until that prescription is gone.  Then you will take 1 tablet of losartan 50 mg once a day.  Labwork: None ordered.  Testing/Procedures: None ordered.  Follow-Up: Your physician wants you to follow-up in: one year with Dr. Lalla Brothers at the [facility] office.   You will receive a reminder letter in the mail two months in advance. If you don't receive a letter, please call our office to schedule the follow-up appointment.  Remote monitoring is used to monitor your ICD from home.   You will be set up with the device clinic to monitor your device remotely.  Device clinic [phone]  Any Other Special Instructions Will Be Listed Below (If Applicable).  If you need a refill on your cardiac medications before your next appointment, please call your pharmacy.

## 2021-01-14 ENCOUNTER — Telehealth: Payer: Self-pay | Admitting: Family

## 2021-01-14 ENCOUNTER — Ambulatory Visit: Payer: Self-pay | Admitting: Family

## 2021-01-14 NOTE — Telephone Encounter (Signed)
Patient did not show for his Heart Failure Clinic appointment on 01/14/21. Will attempt to reschedule.  

## 2021-01-15 ENCOUNTER — Other Ambulatory Visit: Payer: Self-pay

## 2021-01-15 ENCOUNTER — Ambulatory Visit (INDEPENDENT_AMBULATORY_CARE_PROVIDER_SITE_OTHER): Payer: Self-pay | Admitting: Cardiology

## 2021-01-15 ENCOUNTER — Telehealth: Payer: Self-pay

## 2021-01-15 ENCOUNTER — Encounter: Payer: Self-pay | Admitting: Cardiology

## 2021-01-15 DIAGNOSIS — I428 Other cardiomyopathies: Secondary | ICD-10-CM

## 2021-01-15 DIAGNOSIS — I10 Essential (primary) hypertension: Secondary | ICD-10-CM

## 2021-01-15 DIAGNOSIS — Z95810 Presence of automatic (implantable) cardiac defibrillator: Secondary | ICD-10-CM

## 2021-01-15 DIAGNOSIS — F17200 Nicotine dependence, unspecified, uncomplicated: Secondary | ICD-10-CM

## 2021-01-15 DIAGNOSIS — E7800 Pure hypercholesterolemia, unspecified: Secondary | ICD-10-CM

## 2021-01-15 MED ORDER — ENTRESTO 49-51 MG PO TABS: 1.0000 | ORAL_TABLET | Freq: Two times a day (BID) | ORAL | 5 refills | Status: DC

## 2021-01-15 MED ORDER — FUROSEMIDE 40 MG PO TABS: 40.0000 mg | ORAL_TABLET | Freq: Every day | ORAL | 5 refills | Status: DC

## 2021-01-15 NOTE — Telephone Encounter (Signed)
When the patient was seen today by Dr. Azucena Cecil today his Lasix was to be reduced to 40 mg daily. This instruction was not placed on his AVS. Called to update the patient. Unable to lmtcb. Patients voicemail is full. Updated AVS mailed to the patient. Will try again to reach the patient by telephone.

## 2021-01-15 NOTE — Progress Notes (Signed)
Cardiology Office Note:    Date:  01/15/2021   ID:  Travis Lucas, DOB [birth year], MRN [id number]  PCP:  Patient, No Pcp Per  [place] Cardiologist:  Kate Sable, MD  [place] Electrophysiologist:  Vickie Epley, MD   Referring MD: No ref. provider found   Chief Complaint  Patient presents with  . Other    4 week follow up - patient has no complaints. Meds reviewed verbally with patient     History of Present Illness:    Travis Lucas is a 50 y.o. male with a hx of  NICM, EF < 20% s/p AICD 08/2019 ([employer]), hypertension, hyperlipidemia, current smoker presenting for follow-up.  Patient being seen for heart failure and medication titration. Patient did not have insurance previously, as such Entresto was not initiated. He now states his insurance is able to cover Entresto. He takes all his medications as prescribed. Denies shortness of breath or edema. Tolerating all meds.   Prior notes Left heart cath over in [place] likely [place] with no CAD. AICD placed 2020 [employer].  Past Medical History:  Diagnosis Date  . BOOP (bronchiolitis obliterans with organizing pneumonia) ([place])   . CHF (congestive heart failure) ([place])   . Chronic back pain   . Diabetes mellitus without complication ([place])   . Hyperlipidemia   . Hypertension     Past Surgical History:  Procedure Laterality Date  . CARDIAC DEFIBRILLATOR PLACEMENT  09/10/2019  . CHOLECYSTECTOMY      Current Medications: Current Meds  Medication Sig  . atorvastatin (LIPITOR) 80 MG tablet Take 1 tablet (80 mg total) by mouth daily.  . blood glucose meter kit and supplies KIT Dispense based on patient and insurance preference. Use up to four times daily as directed. (FOR ICD-9 250.00, 250.01).  . furosemide (LASIX) 40 MG tablet Take 1 tablet (40 mg total) by mouth 2 (two) times daily.  Marland Kitchen glipiZIDE (GLUCOTROL XL) 2.5 MG 24 hr tablet Take one tablet with lunch  . insulin glargine (LANTUS) 100 UNIT/ML  Solostar Pen Inject 20 Units into the skin 2 (two) times daily.  . Insulin Pen Needle (PEN NEEDLES) 31G X 5 MM MISC 15 Units by Does not apply route 2 (two) times daily.  Marland Kitchen lidocaine (LIDODERM) 5 % Place 1 patch onto the skin daily. Remove & Discard patch within 12 hours or as directed by MD  . losartan (COZAAR) 50 MG tablet Take 1 tablet (50 mg total) by mouth daily.  . metoprolol succinate (TOPROL-XL) 100 MG 24 hr tablet Take 1 tablet (100 mg total) by mouth daily. Take with or immediately following a meal.  . sacubitril-valsartan (ENTRESTO) 49-51 MG Take 1 tablet by mouth 2 (two) times daily.  Marland Kitchen spironolactone (ALDACTONE) 25 MG tablet Take 1 tablet (25 mg total) by mouth daily.  . vitamin B-12 (CYANOCOBALAMIN) 1000 MCG tablet Take 1 tablet (1,000 mcg total) by mouth daily.  . Vitamin D, Ergocalciferol, (DRISDOL) 1.25 MG (50000 UNIT) CAPS capsule Take 1 capsule (50,000 Units total) by mouth every 7 (seven) days.     Allergies:   Penicillins   Social History   Socioeconomic History  . Marital status: Single    Spouse name: Not on file  . Number of children: Not on file  . Years of education: Not on file  . Highest education level: Not on file  Occupational History  . Not on file  Tobacco Use  . Smoking status: Current Some Day Smoker  Types: Cigarettes  . Smokeless tobacco: Never Used  . Tobacco comment: 1 cigarrette a day   Vaping Use  . Vaping Use: Never used  Substance and Sexual Activity  . Alcohol use: Yes    Comment: "socially"   . Drug use: Yes    Types: Marijuana  . Sexual activity: Not on file  Other Topics Concern  . Not on file  Social History Narrative  . Not on file   Social Determinants of Health   Financial Resource Strain: Not on file  Food Insecurity: Not on file  Transportation Needs: Not on file  Physical Activity: Not on file  Stress: Not on file  Social Connections: Not on file     Family History: The patient's family history is not on  file.  ROS:   Please see the history of present illness.     All other systems reviewed and are negative.  EKGs/Labs/Other Studies Reviewed:    The following studies were reviewed today:   EKG:  EKG is ordered today. EKG shows normal sinus rhythm.  Recent Labs: 10/26/2020: B Natriuretic Peptide 1,396.1 11/10/2020: Hemoglobin 13.2; Platelets 289 01/06/2021: ALT 26; BUN 8; Creatinine, Ser 0.82; Potassium 3.7; Sodium 137; TSH 2.550  Recent Lipid Panel    Component Value Date/Time   CHOL 136 01/06/2021 1257   TRIG 91 01/06/2021 1257   HDL 59 01/06/2021 1257   CHOLHDL 2.3 01/06/2021 1257   LDLCALC 60 01/06/2021 1257     Risk Assessment/Calculations:      Physical Exam:    VS:  BP 100/60 (BP Location: Left Arm, Patient Position: Sitting, Cuff Size: Normal)   Pulse 77   Ht $R'6\' 3"'qH$  (1.905 m)   Wt 176 lb (79.8 kg)   BMI 22.00 kg/m     Wt Readings from Last 3 Encounters:  01/15/21 176 lb (79.8 kg)  01/13/21 181 lb (82.1 kg)  01/06/21 180 lb 1.6 oz (81.7 kg)     GEN:  Well nourished, well developed in no acute distress HEENT: Normal NECK: No JVD; No carotid bruits LYMPHATICS: No lymphadenopathy CARDIAC: RRR, no murmurs, rubs, gallops RESPIRATORY:  Clear to auscultation without rales, wheezing or rhonchi  ABDOMEN: Soft, non-tender, non-distended MUSCULOSKELETAL:  No edema; No deformity  SKIN: Warm and dry NEUROLOGIC:  Alert and oriented x 3 PSYCHIATRIC:  Normal affect   ASSESSMENT:    1. NICM (nonischemic cardiomyopathy) ([place])   2. S/P implantation of automatic cardioverter/defibrillator (AICD)   3. Primary hypertension   4. Pure hypercholesterolemia   5. Smoking    PLAN:    In order of problems listed above:  1. Nonischemic cardiomyopathy, EF < 20%.  NYHA class II symptoms, appears euvolemic. Stop losartan, start Entresto 49-51 milligrams twice daily. Continue Toprol-XL to 100 mg daily,  Aldactone $RemoveBe'25mg'IhVollRCT$  daily. Decrease Lasix to 40 mg daily. 2. AICD implant.  Keep appointment with EP/device clinic for monitoring. 3. Hypertension, BP controlled.  Continue current meds as above. 4. Hyperlipidemia, continue Lipitor. 5. Current smoker, smoking cessation recommended.  Follow-up in 1 month for Entresto titration if BP permits.   Medication Adjustments/Labs and Tests Ordered: Current medicines are reviewed at length with the patient today.  Concerns regarding medicines are outlined above.  Orders Placed This Encounter  Procedures  . EKG 12-Lead   Meds ordered this encounter  Medications  . sacubitril-valsartan (ENTRESTO) 49-51 MG    Sig: Take 1 tablet by mouth 2 (two) times daily.    Dispense:  60 tablet  Refill:  5    STOP Losartan    Patient Instructions  Medication Instructions:  Your physician has recommended you make the following change in your medication:   STOP Losartan  START Entresto 49/51 mg twice daily. An Rx has been sent to your pharmacy.  *If you need a refill on your cardiac medications before your next appointment, please call your pharmacy*   Lab Work: None ordered If you have labs (blood work) drawn today and your tests are completely normal, you will receive your results only by: Marland Kitchen MyChart Message (if you have MyChart) OR . A paper copy in the mail If you have any lab test that is abnormal or we need to change your treatment, we will call you to review the results.   Testing/Procedures: None ordered   Follow-Up: At [facility], you and your health needs are our priority.  As part of our continuing mission to provide you with exceptional heart care, we have created designated Provider Care Teams.  These Care Teams include your primary Cardiologist (physician) and Advanced Practice Providers (APPs -  Physician Assistants and Nurse Practitioners) who all work together to provide you with the care you need, when you need it.  We recommend signing up for the patient portal called "MyChart".  Sign up  information is provided on this After Visit Summary.  MyChart is used to connect with patients for Virtual Visits (Telemedicine).  Patients are able to view lab/test results, encounter notes, upcoming appointments, etc.  Non-urgent messages can be sent to your provider as well.   To learn more about what you can do with MyChart, go to [email or web address].    Your next appointment:   4 week(s)  The format for your next appointment:   In Person  Provider:   Kate Sable, MD   Other Instructions N/A     Signed, Kate Sable, MD  01/15/2021 12:45 PM    [place]

## 2021-01-15 NOTE — Addendum Note (Signed)
Addended by: Jarvis Newcomer on: 01/15/2021 01:58 PM   Modules accepted: Orders

## 2021-01-15 NOTE — Patient Instructions (Addendum)
Medication Instructions:  Your physician has recommended you make the following change in your medication:   STOP Losartan  START Entresto 49/51 mg twice daily. An Rx has been sent to your pharmacy.  DECREASE Lasix to 40 mg daily.  *If you need a refill on your cardiac medications before your next appointment, please call your pharmacy*   Lab Work: None ordered If you have labs (blood work) drawn today and your tests are completely normal, you will receive your results only by: Marland Kitchen MyChart Message (if you have MyChart) OR . A paper copy in the mail If you have any lab test that is abnormal or we need to change your treatment, we will call you to review the results.   Testing/Procedures: None ordered   Follow-Up: At [facility], you and your health needs are our priority.  As part of our continuing mission to provide you with exceptional heart care, we have created designated Provider Care Teams.  These Care Teams include your primary Cardiologist (physician) and Advanced Practice Providers (APPs -  Physician Assistants and Nurse Practitioners) who all work together to provide you with the care you need, when you need it.  We recommend signing up for the patient portal called "MyChart".  Sign up information is provided on this After Visit Summary.  MyChart is used to connect with patients for Virtual Visits (Telemedicine).  Patients are able to view lab/test results, encounter notes, upcoming appointments, etc.  Non-urgent messages can be sent to your provider as well.   To learn more about what you can do with MyChart, go to [email or web address].    Your next appointment:   4 week(s)  The format for your next appointment:   In Person  Provider:   Debbe Odea, MD   Other Instructions N/A

## 2021-01-18 NOTE — Telephone Encounter (Signed)
Attempted again to reach the patient. Unable to lmom. Patients voicemail is full. AVS mailed on 01/15/21 with note to med change.

## 2021-01-20 ENCOUNTER — Telehealth: Payer: Self-pay | Admitting: Pharmacist

## 2021-01-20 NOTE — Telephone Encounter (Signed)
01/20/2021 10:15:02 AM - Sherryll Burger to pat & provider  -- [name] - Wednesday, January 20, 2021 10:13 AM -- Received pharmacy printout for Sherryll Burger 49/51mg  Take one tablet by mouth 2 times a day, printed [employer] application-Sending provider to [facility] [address] & patient portion in bag with meds on the wall.

## 2021-01-21 ENCOUNTER — Telehealth: Payer: Self-pay

## 2021-01-21 NOTE — Telephone Encounter (Signed)
LMOVM for pt to return my call. We need the patient home monitor model/serial number to put in [facility]. If he do not have a monitor we need to order the patient a monitor.

## 2021-01-28 ENCOUNTER — Ambulatory Visit: Payer: Self-pay | Admitting: Family

## 2021-02-04 ENCOUNTER — Ambulatory Visit: Payer: Self-pay | Admitting: Gerontology

## 2021-02-04 ENCOUNTER — Other Ambulatory Visit: Payer: Self-pay | Admitting: Gerontology

## 2021-02-04 ENCOUNTER — Other Ambulatory Visit: Payer: Self-pay

## 2021-02-04 ENCOUNTER — Encounter: Payer: Self-pay | Admitting: Gerontology

## 2021-02-04 VITALS — BP 129/86 | HR 88 | Ht 73.0 in | Wt 179.6 lb

## 2021-02-04 DIAGNOSIS — E1165 Type 2 diabetes mellitus with hyperglycemia: Secondary | ICD-10-CM

## 2021-02-04 DIAGNOSIS — M545 Low back pain, unspecified: Secondary | ICD-10-CM

## 2021-02-04 DIAGNOSIS — G629 Polyneuropathy, unspecified: Secondary | ICD-10-CM

## 2021-02-04 DIAGNOSIS — R899 Unspecified abnormal finding in specimens from other organs, systems and tissues: Secondary | ICD-10-CM

## 2021-02-04 MED ORDER — GABAPENTIN 300 MG PO CAPS: 300.0000 mg | ORAL_CAPSULE | Freq: Every day | ORAL | 0 refills | Status: DC

## 2021-02-04 MED ORDER — INSULIN GLARGINE 100 UNIT/ML SOLOSTAR PEN: 48.0000 [IU] | PEN_INJECTOR | Freq: Every day | SUBCUTANEOUS | 11 refills | Status: DC

## 2021-02-04 MED ORDER — INSULIN ASPART 100 UNIT/ML FLEXPEN: 22.0000 [IU] | PEN_INJECTOR | Freq: Three times a day (TID) | SUBCUTANEOUS | 1 refills | Status: DC

## 2021-02-04 MED ORDER — LIDOCAINE 5 % EX PTCH: 1.0000 | MEDICATED_PATCH | CUTANEOUS | 0 refills | Status: DC

## 2021-02-04 NOTE — Progress Notes (Signed)
Established Patient Office Visit  Subjective:  Patient ID: Travis Lucas, male    DOB: Sep 09, 1971  Age: 50 y.o. MRN: [id number]  CC:  Chief Complaint  Patient presents with  . Medication Problem    Review medications, some he is not taking anymore, one of them was stopped.  . Diabetes    Routine check up    HPI Travis Lucas  is a 50 y.o. male with a h/o T2DM, CAD, ischemic cardiomyopathy s/p AICD placement, Combined D/S Heart failure and hypertensive heart disease who presents today for routine follow up. His HgbA1c done on 01/06/21 was 11.5%, he checks his blood glucose  Qid and it has been less than 120 mg/dl. He denies hypo/hyperglycemia, with intermittent peripheral neuropathy to feet that makes his feet feel heavy. He states that he's complaint with his medication regimen and continues to make healthy lifestyle changes. His blood glucose checked during visit was 236 mg/dl.  He has Proteinuria 2+, RBC 1+ and microalb/creat ration of 1,998. He was admitted at [facility] on 01/24/21 and was discharged on 01/30/21. During his hospital course, he was treated for Acute pancreatitis, Hyperosmolar hyperglycemic states and AKI. He was started on 48 units of Lantus at bedtime, Novolog Lispro 22 units QID with meals. He was also seen by Cardiologist on 01/15/21 by Dr Kate Sable, his Losartan was discontinued and he was started on Entresto 49-51 mg bid, 100 mg Toprol daily, Aldactone 25 mg daily and Lasix was decreased to 40 mg daily. He will follow up with EP/device for his AICD implant. Overall, he states that he's doing well and offers no further complaint.   Past Medical History:  Diagnosis Date  . BOOP (bronchiolitis obliterans with organizing pneumonia) ([place])   . CHF (congestive heart failure) ([place])   . Chronic back pain   . Diabetes mellitus without complication ([place])   . Hyperlipidemia   . Hypertension     Past Surgical History:  Procedure Laterality Date  . CARDIAC  DEFIBRILLATOR PLACEMENT  09/10/2019  . CHOLECYSTECTOMY      No family history on file.  Social History   Socioeconomic History  . Marital status: Single    Spouse name: Not on file  . Number of children: Not on file  . Years of education: Not on file  . Highest education level: Not on file  Occupational History  . Not on file  Tobacco Use  . Smoking status: Current Some Day Smoker    Types: Cigarettes  . Smokeless tobacco: Never Used  . Tobacco comment: 1 cigarrette a day   Vaping Use  . Vaping Use: Never used  Substance and Sexual Activity  . Alcohol use: Yes    Comment: "socially"   . Drug use: Yes    Types: Marijuana  . Sexual activity: Not on file  Other Topics Concern  . Not on file  Social History Narrative  . Not on file   Social Determinants of Health   Financial Resource Strain: Not on file  Food Insecurity: Not on file  Transportation Needs: Not on file  Physical Activity: Not on file  Stress: Not on file  Social Connections: Not on file  Intimate Partner Violence: Not on file    Outpatient Medications Prior to Visit  Medication Sig Dispense Refill  . atorvastatin (LIPITOR) 80 MG tablet Take 1 tablet (80 mg total) by mouth daily. 90 tablet 1  . furosemide (LASIX) 40 MG tablet Take 1 tablet (40 mg total) by  mouth daily. 30 tablet 5  . lidocaine (LIDODERM) 5 % Place 1 patch onto the skin daily. Remove & Discard patch within 12 hours or as directed by MD 30 patch 0  . metoprolol succinate (TOPROL-XL) 100 MG 24 hr tablet Take 1 tablet (100 mg total) by mouth daily. Take with or immediately following a meal. 30 tablet 5  . sacubitril-valsartan (ENTRESTO) 49-51 MG Take 1 tablet by mouth 2 (two) times daily. 60 tablet 5  . spironolactone (ALDACTONE) 25 MG tablet Take 1 tablet (25 mg total) by mouth daily. 90 tablet 1  . vitamin B-12 (CYANOCOBALAMIN) 1000 MCG tablet Take 1 tablet (1,000 mcg total) by mouth daily. 90 tablet 1  . Vitamin D, Ergocalciferol,  (DRISDOL) 1.25 MG (50000 UNIT) CAPS capsule Take 1 capsule (50,000 Units total) by mouth every 7 (seven) days. 5 capsule 1  . insulin aspart (NOVOLOG) 100 UNIT/ML FlexPen Inject 22 Units into the skin 4 (four) times daily.    . insulin glargine (LANTUS) 100 UNIT/ML Solostar Pen Inject 20 Units into the skin 2 (two) times daily. 15 mL 11  . blood glucose meter kit and supplies KIT Dispense based on patient and insurance preference. Use up to four times daily as directed. (FOR ICD-9 250.00, 250.01). 1 each 0  . Insulin Pen Needle (PEN NEEDLES) 31G X 5 MM MISC 15 Units by Does not apply route 2 (two) times daily. 100 each 6  . glipiZIDE (GLUCOTROL XL) 2.5 MG 24 hr tablet Take one tablet with lunch 30 tablet 2  . losartan (COZAAR) 50 MG tablet Take 1 tablet (50 mg total) by mouth daily. (Patient not taking: Reported on 02/04/2021) 90 tablet 3   No facility-administered medications prior to visit.    Allergies  Allergen Reactions  . Penicillins     ROS Review of Systems  Constitutional: Negative.   Eyes: Negative.   Respiratory: Negative.   Cardiovascular: Negative.   Gastrointestinal: Negative.   Endocrine: Negative.   Skin: Negative.   Neurological: Negative.   Psychiatric/Behavioral: Negative.       Objective:    Physical Exam HENT:     Head: Normocephalic and atraumatic.  Eyes:     Extraocular Movements: Extraocular movements intact.     Conjunctiva/sclera: Conjunctivae normal.     Pupils: Pupils are equal, round, and reactive to light.  Cardiovascular:     Rate and Rhythm: Normal rate and regular rhythm.     Pulses: Normal pulses.     Heart sounds: Normal heart sounds.  Pulmonary:     Effort: Pulmonary effort is normal.     Breath sounds: Normal breath sounds.  Abdominal:     General: Abdomen is flat. Bowel sounds are normal.     Palpations: Abdomen is soft.  Skin:    General: Skin is warm.  Neurological:     General: No focal deficit present.     Mental Status: He  is alert and oriented to person, place, and time. Mental status is at baseline.  Psychiatric:        Mood and Affect: Mood normal.        Behavior: Behavior normal.        Thought Content: Thought content normal.        Judgment: Judgment normal.     BP 129/86   Pulse 88   Ht $R'6\' 1"'Es$  (1.854 m)   Wt 179 lb 9.6 oz (81.5 kg)   SpO2 99%   BMI 23.70 kg/m  Wt Readings  from Last 3 Encounters:  02/04/21 179 lb 9.6 oz (81.5 kg)  01/15/21 176 lb (79.8 kg)  01/13/21 181 lb (82.1 kg)     Health Maintenance Due  Topic Date Due  . Hepatitis C Screening  Never done  . PNEUMOCOCCAL POLYSACCHARIDE VACCINE AGE 57-64 HIGH RISK  Never done  . COVID-19 Vaccine (1) Never done  . OPHTHALMOLOGY EXAM  Never done  . TETANUS/TDAP  Never done  . COLONOSCOPY (Pts 45-48yrs Insurance coverage will need to be confirmed)  Never done    There are no preventive care reminders to display for this patient.  Lab Results  Component Value Date   TSH 2.550 01/06/2021   Lab Results  Component Value Date   WBC 4.5 11/10/2020   HGB 13.2 11/10/2020   HCT 41.5 11/10/2020   MCV 89 11/10/2020   PLT 289 11/10/2020   Lab Results  Component Value Date   NA 137 01/06/2021   K 3.7 01/06/2021   CO2 25 01/06/2021   GLUCOSE 442 (H) 01/06/2021   BUN 8 01/06/2021   CREATININE 0.82 01/06/2021   BILITOT 0.7 01/06/2021   ALKPHOS 177 (H) 01/06/2021   AST 27 01/06/2021   ALT 26 01/06/2021   PROT 6.3 01/06/2021   ALBUMIN 3.7 (L) 01/06/2021   CALCIUM 8.7 01/06/2021   ANIONGAP 7 11/02/2020   Lab Results  Component Value Date   CHOL 136 01/06/2021   Lab Results  Component Value Date   HDL 59 01/06/2021   Lab Results  Component Value Date   LDLCALC 60 01/06/2021   Lab Results  Component Value Date   TRIG 91 01/06/2021   Lab Results  Component Value Date   CHOLHDL 2.3 01/06/2021   Lab Results  Component Value Date   HGBA1C 11.5 (H) 01/06/2021      Assessment & Plan:   1. Uncontrolled type 2  diabetes mellitus with hyperglycemia ([place]) - He will continue on Lantus 48 units, His Novolog was decreased to 22 units tid, he was advised to check his blood glucose record, bring log to follow up. His HgbA1c was 11.5% and his goal should be less than 7%. - insulin glargine (LANTUS) 100 UNIT/ML Solostar Pen; Inject 48 Units into the skin at bedtime.  Dispense: 15 mL; Refill: 11 - insulin aspart (NOVOLOG) 100 UNIT/ML FlexPen; Inject 22 Units into the skin 3 (three) times daily with meals.  Dispense: 15 mL; Refill: 1  2. Peripheral polyneuropathy - He will continue on gabapentin 300 mg qhs. - gabapentin (NEURONTIN) 300 MG capsule; Take 1 capsule (300 mg total) by mouth at bedtime.  Dispense: 30 capsule; Refill: 0  3. Chronic low back pain, unspecified back pain laterality, unspecified whether sciatica present - He will continue on Lidocaine patch and will follow up with Dr Vickki Hearing. - lidocaine (LIDODERM) 5 %; Place 1 patch onto the skin daily. Remove & Discard patch within 12 hours or as directed by MD  Dispense: 30 patch; Refill: 0   4. Abnormal laboratory test result - Will recheck Urine microalbuminuria, and Urinalysis, if Proteinuria, hematuria ae=re present, will refer to Urology.    Follow-up: Return in about 20 days (around 02/24/2021), or if symptoms worsen or fail to improve.    Azaliah Carrero Jerold Coombe, NP

## 2021-02-04 NOTE — Telephone Encounter (Signed)
Attempted to contact patient in reference to making sure that patient has a home monitor. Needing a serial number from home monitor to enter in [place]. Made contact with family member and left a message for Travis Lucas to contact the Device Clinic. Device Clinic number  given [phone]. Waiting on a call return call from patient.

## 2021-02-04 NOTE — Patient Instructions (Signed)

## 2021-02-07 NOTE — Progress Notes (Incomplete)
Established Patient Office Visit  Subjective:  Patient ID: Travis Lucas, male    DOB: 11/02/1971  Age: 50 y.o. MRN: [id number]  CC:  Chief Complaint  Patient presents with  . Medication Problem    Review medications, some he is not taking anymore, one of them was stopped.  . Diabetes    Routine check up    HPI Travis Lucas  is a 50 y.o. male with a h/o T2DM, CAD, ischemic cardiomyopathy s/p AICD placement, Combined D/S Heart failure and hypertensive heart disease who presents today for routine follow up. His HgbA1c done on 01/06/21 was 11.5%, he checks his blood glucose  Qid and it has been less than 120 mg/dl. He denies hypo/hyperglycemia, with intermittent peripheral neuropathy to feet that makes his feet feel heavy. He states that he's complaint with his medication regimen and continues to make healthy lifestyle changes. His blood glucose checked during visit was 236 mg/dl.  He has Proteinuria 2+, RBC 1+ and microalb/creat ration of 1,998. He was admitted at [facility] on 01/24/21 and was discharged on 01/30/21. During his hospital course, he was treated for Acute pancreatitis, Hyperosmolar hyperglycemic states and AKI. He was started on 48 units of Lantus at bedtime, Novolog Lispro 22 units QID with meals. He was also seen by Cardiologist on 01/15/21 by Dr Kate Sable, his Losartan was discontinued and he was started on Entresto 49-51 mg bid, 100 mg Toprol daily, Aldactone 25 mg daily and Lasix was decreased to 40 mg daily. He will follow up with EP/device for his AICD implant. Overall, he states that he's doing well and offers no further complaint.   Past Medical History:  Diagnosis Date  . BOOP (bronchiolitis obliterans with organizing pneumonia) ([place])   . CHF (congestive heart failure) ([place])   . Chronic back pain   . Diabetes mellitus without complication ([place])   . Hyperlipidemia   . Hypertension     Past Surgical History:  Procedure Laterality Date  . CARDIAC  DEFIBRILLATOR PLACEMENT  09/10/2019  . CHOLECYSTECTOMY      No family history on file.  Social History   Socioeconomic History  . Marital status: Single    Spouse name: Not on file  . Number of children: Not on file  . Years of education: Not on file  . Highest education level: Not on file  Occupational History  . Not on file  Tobacco Use  . Smoking status: Current Some Day Smoker    Types: Cigarettes  . Smokeless tobacco: Never Used  . Tobacco comment: 1 cigarrette a day   Vaping Use  . Vaping Use: Never used  Substance and Sexual Activity  . Alcohol use: Yes    Comment: "socially"   . Drug use: Yes    Types: Marijuana  . Sexual activity: Not on file  Other Topics Concern  . Not on file  Social History Narrative  . Not on file   Social Determinants of Health   Financial Resource Strain: Not on file  Food Insecurity: Not on file  Transportation Needs: Not on file  Physical Activity: Not on file  Stress: Not on file  Social Connections: Not on file  Intimate Partner Violence: Not on file    Outpatient Medications Prior to Visit  Medication Sig Dispense Refill  . atorvastatin (LIPITOR) 80 MG tablet Take 1 tablet (80 mg total) by mouth daily. 90 tablet 1  . blood glucose meter kit and supplies KIT Dispense based on patient and  insurance preference. Use up to four times daily as directed. (FOR ICD-9 250.00, 250.01). 1 each 0  . furosemide (LASIX) 40 MG tablet Take 1 tablet (40 mg total) by mouth daily. 30 tablet 5  . glipiZIDE (GLUCOTROL XL) 2.5 MG 24 hr tablet Take one tablet with lunch 30 tablet 2  . insulin glargine (LANTUS) 100 UNIT/ML Solostar Pen Inject 20 Units into the skin 2 (two) times daily. 15 mL 11  . Insulin Pen Needle (PEN NEEDLES) 31G X 5 MM MISC 15 Units by Does not apply route 2 (two) times daily. 100 each 6  . lidocaine (LIDODERM) 5 % Place 1 patch onto the skin daily. Remove & Discard patch within 12 hours or as directed by MD 30 patch 0  .  losartan (COZAAR) 50 MG tablet Take 1 tablet (50 mg total) by mouth daily. 90 tablet 3  . metoprolol succinate (TOPROL-XL) 100 MG 24 hr tablet Take 1 tablet (100 mg total) by mouth daily. Take with or immediately following a meal. 30 tablet 5  . sacubitril-valsartan (ENTRESTO) 49-51 MG Take 1 tablet by mouth 2 (two) times daily. 60 tablet 5  . spironolactone (ALDACTONE) 25 MG tablet Take 1 tablet (25 mg total) by mouth daily. 90 tablet 1  . vitamin B-12 (CYANOCOBALAMIN) 1000 MCG tablet Take 1 tablet (1,000 mcg total) by mouth daily. 90 tablet 1  . Vitamin D, Ergocalciferol, (DRISDOL) 1.25 MG (50000 UNIT) CAPS capsule Take 1 capsule (50,000 Units total) by mouth every 7 (seven) days. 5 capsule 1   No facility-administered medications prior to visit.    Allergies  Allergen Reactions  . Penicillins     ROS Review of Systems  Constitutional: Negative.   Eyes: Negative.   Respiratory: Negative.   Cardiovascular: Negative.   Gastrointestinal: Negative.   Endocrine: Negative.   Skin: Negative.   Neurological: Negative.   Psychiatric/Behavioral: Negative.       Objective:    Physical Exam HENT:     Head: Normocephalic and atraumatic.  Eyes:     Extraocular Movements: Extraocular movements intact.     Conjunctiva/sclera: Conjunctivae normal.     Pupils: Pupils are equal, round, and reactive to light.  Cardiovascular:     Rate and Rhythm: Normal rate and regular rhythm.     Pulses: Normal pulses.     Heart sounds: Normal heart sounds.  Pulmonary:     Effort: Pulmonary effort is normal.     Breath sounds: Normal breath sounds.  Abdominal:     General: Abdomen is flat. Bowel sounds are normal.     Palpations: Abdomen is soft.  Skin:    General: Skin is warm.  Neurological:     General: No focal deficit present.     Mental Status: He is alert and oriented to person, place, and time. Mental status is at baseline.  Psychiatric:        Mood and Affect: Mood normal.         Behavior: Behavior normal.        Thought Content: Thought content normal.        Judgment: Judgment normal.     BP 129/86   Pulse 88   Ht _0  (1.854 m)   Wt 179 lb 9.6 oz (81.5 kg)   SpO2 99%   BMI 23.70 kg/m  Wt Readings from Last 3 Encounters:  02/04/21 179 lb 9.6 oz (81.5 kg)  01/15/21 176 lb (79.8 kg)  01/13/21 181 lb (82.1 kg)  Health Maintenance Due  Topic Date Due  . Hepatitis C Screening  Never done  . PNEUMOCOCCAL POLYSACCHARIDE VACCINE AGE 69-64 HIGH RISK  Never done  . COVID-19 Vaccine (1) Never done  . OPHTHALMOLOGY EXAM  Never done  . TETANUS/TDAP  Never done  . COLONOSCOPY (Pts 45-5yr Insurance coverage will need to be confirmed)  Never done  . INFLUENZA VACCINE  Never done    There are no preventive care reminders to display for this patient.  Lab Results  Component Value Date   TSH 2.550 01/06/2021   Lab Results  Component Value Date   WBC 4.5 11/10/2020   HGB 13.2 11/10/2020   HCT 41.5 11/10/2020   MCV 89 11/10/2020   PLT 289 11/10/2020   Lab Results  Component Value Date   NA 137 01/06/2021   K 3.7 01/06/2021   CO2 25 01/06/2021   GLUCOSE 442 (H) 01/06/2021   BUN 8 01/06/2021   CREATININE 0.82 01/06/2021   BILITOT 0.7 01/06/2021   ALKPHOS 177 (H) 01/06/2021   AST 27 01/06/2021   ALT 26 01/06/2021   PROT 6.3 01/06/2021   ALBUMIN 3.7 (L) 01/06/2021   CALCIUM 8.7 01/06/2021   ANIONGAP 7 11/02/2020   Lab Results  Component Value Date   CHOL 136 01/06/2021   Lab Results  Component Value Date   HDL 59 01/06/2021   Lab Results  Component Value Date   LDLCALC 60 01/06/2021   Lab Results  Component Value Date   TRIG 91 01/06/2021   Lab Results  Component Value Date   CHOLHDL 2.3 01/06/2021   Lab Results  Component Value Date   HGBA1C 11.5 (H) 01/06/2021      Assessment & Plan:   1. Uncontrolled type 2 diabetes mellitus with hyperglycemia ([place] - He will continue on - insulin glargine (LANTUS) 100 UNIT/ML  Solostar Pen; Inject 48 Units into the skin at bedtime.  Dispense: 15 mL; Refill: 11 - insulin aspart (NOVOLOG) 100 UNIT/ML FlexPen; Inject 22 Units into the skin 3 (three) times daily with meals.  Dispense: 15 mL; Refill: 1  2. Peripheral polyneuropathy *** - gabapentin (NEURONTIN) 300 MG capsule; Take 1 capsule (300 mg total) by mouth at bedtime.  Dispense: 30 capsule; Refill: 0  3. Chronic low back pain, unspecified back pain laterality, unspecified whether sciatica present *** - lidocaine (LIDODERM) 5 %; Place 1 patch onto the skin daily. Remove & Discard patch within 12 hours or as directed by MD  Dispense: 30 patch; Refill: 0     Follow-up: No follow-ups on file.    [place] EJerold Coombe NP

## 2021-02-08 DIAGNOSIS — R899 Unspecified abnormal finding in specimens from other organs, systems and tissues: Secondary | ICD-10-CM | POA: Insufficient documentation

## 2021-02-10 ENCOUNTER — Other Ambulatory Visit: Payer: Self-pay | Admitting: Gerontology

## 2021-02-10 DIAGNOSIS — G8929 Other chronic pain: Secondary | ICD-10-CM

## 2021-02-12 ENCOUNTER — Ambulatory Visit: Payer: Self-pay | Admitting: Cardiology

## 2021-02-15 ENCOUNTER — Encounter: Payer: Self-pay | Admitting: Cardiology

## 2021-02-18 ENCOUNTER — Encounter: Payer: Self-pay | Admitting: Adult Health

## 2021-02-18 DIAGNOSIS — Z95810 Presence of automatic (implantable) cardiac defibrillator: Secondary | ICD-10-CM | POA: Insufficient documentation

## 2021-02-18 NOTE — Telephone Encounter (Signed)
LMOVM for patient to return device clinic call. I left device clinic number as well.

## 2021-02-19 ENCOUNTER — Encounter: Payer: Self-pay | Admitting: Cardiology

## 2021-02-19 ENCOUNTER — Ambulatory Visit (INDEPENDENT_AMBULATORY_CARE_PROVIDER_SITE_OTHER): Payer: Self-pay | Admitting: Cardiology

## 2021-02-19 ENCOUNTER — Other Ambulatory Visit: Payer: Self-pay

## 2021-02-19 ENCOUNTER — Other Ambulatory Visit: Payer: Self-pay | Admitting: Cardiology

## 2021-02-19 VITALS — BP 128/80 | HR 90 | Ht 73.0 in | Wt 184.0 lb

## 2021-02-19 DIAGNOSIS — Z95810 Presence of automatic (implantable) cardiac defibrillator: Secondary | ICD-10-CM

## 2021-02-19 DIAGNOSIS — E7800 Pure hypercholesterolemia, unspecified: Secondary | ICD-10-CM

## 2021-02-19 DIAGNOSIS — F17200 Nicotine dependence, unspecified, uncomplicated: Secondary | ICD-10-CM

## 2021-02-19 DIAGNOSIS — I10 Essential (primary) hypertension: Secondary | ICD-10-CM

## 2021-02-19 DIAGNOSIS — I428 Other cardiomyopathies: Secondary | ICD-10-CM

## 2021-02-19 MED ORDER — FUROSEMIDE 20 MG PO TABS: 20.0000 mg | ORAL_TABLET | Freq: Every day | ORAL | 3 refills | Status: DC

## 2021-02-19 MED ORDER — ENTRESTO 97-103 MG PO TABS: 1.0000 | ORAL_TABLET | Freq: Two times a day (BID) | ORAL | 5 refills | Status: DC

## 2021-02-19 NOTE — Telephone Encounter (Signed)
Certified letter sent 

## 2021-02-19 NOTE — Patient Instructions (Addendum)
Medication Instructions:   Your physician has recommended you make the following change in your medication:   1.  INCREASE your Entresto to 97/103 MG twice a day.  2.  DECREASE your Lasix to 20 MG once daily.   *If you need a refill on your cardiac medications before your next appointment, please call your pharmacy*   Lab Work: None ordered If you have labs (blood work) drawn today and your tests are completely normal, you will receive your results only by: Marland Kitchen MyChart Message (if you have MyChart) OR . A paper copy in the mail If you have any lab test that is abnormal or we need to change your treatment, we will call you to review the results.   Testing/Procedures:  Your physician has requested that you have an echocardiogram in 5 months. Echocardiography is a painless test that uses sound waves to create images of your heart. It provides your doctor with information about the size and shape of your heart and how well your heart's chambers and valves are working. This procedure takes approximately one hour. There are no restrictions for this procedure.     Follow-Up: At [facility], you and your health needs are our priority.  As part of our continuing mission to provide you with exceptional heart care, we have created designated Provider Care Teams.  These Care Teams include your primary Cardiologist (physician) and Advanced Practice Providers (APPs -  Physician Assistants and Nurse Practitioners) who all work together to provide you with the care you need, when you need it.  We recommend signing up for the patient portal called "MyChart".  Sign up information is provided on this After Visit Summary.  MyChart is used to connect with patients for Virtual Visits (Telemedicine).  Patients are able to view lab/test results, encounter notes, upcoming appointments, etc.  Non-urgent messages can be sent to your provider as well.   To learn more about what you can do with MyChart, go to  [email or web address].    Your next appointment:   Follow up after Echo   The format for your next appointment:   In Person  Provider:   Debbe Odea, MD  ONLY    Other Instructions  Samples of Entresto 49/51 MG were given to the patient, quantity 1 box, Lot Number [id number] exp0 12/23

## 2021-02-19 NOTE — Progress Notes (Signed)
Cardiology Office Note:    Date:  02/19/2021   ID:  Travis Lucas, DOB 1971/07/11, MRN [id number]  PCP:  Langston Reusing, NP  [facility] Cardiologist:  Kate Sable, MD  [facility] Electrophysiologist:  Vickie Epley, MD   Referring MD: Langston Reusing, NP   Chief Complaint  Patient presents with  . Other    4 week follow up - Meds reviewed verbally with patient.     History of Present Illness:    Travis Lucas is a 50 y.o. male with a hx of  NICM, EF < 20% s/p AICD 08/2019 ([employer]), hypertension, hyperlipidemia, current smoker presenting for follow-up.  Patient being seen for heart failure and medication titration.  Travis Lucas was started after last visit, tolerating current dose of 49/51 mg twice daily.  Denies shortness of breath, edema.  Tolerating all medications as prescribed.  He is very happy with his device checks happening at home.  Has no other concerns at this time.  Needs to fill medication assistance form for [facility] prescription.  Prior notes Left heart cath over in [place] likely [place] with no CAD. AICD placed 2020 [employer].  Past Medical History:  Diagnosis Date  . BOOP (bronchiolitis obliterans with organizing pneumonia) ([place])   . CHF (congestive heart failure) (Travis Lucas)   . Chronic back pain   . Diabetes mellitus without complication (Travis Lucas)   . Hyperlipidemia   . Hypertension   . Ischemic cardiomyopathy with implantable cardioverter-defibrillator (ICD)     Past Surgical History:  Procedure Laterality Date  . CARDIAC DEFIBRILLATOR PLACEMENT  09/10/2019  . CHOLECYSTECTOMY      Current Medications: Current Meds  Medication Sig  . atorvastatin (LIPITOR) 80 MG tablet Take 1 tablet (80 mg total) by mouth daily.  . blood glucose meter kit and supplies KIT Dispense based on patient and insurance preference. Use up to four times daily as directed. (FOR ICD-9 250.00, 250.01).  Marland Kitchen gabapentin (NEURONTIN) 300 MG capsule Take 1 capsule  (300 mg total) by mouth at bedtime.  . insulin aspart (NOVOLOG) 100 UNIT/ML FlexPen Inject 22 Units into the skin 3 (three) times daily with meals.  . insulin glargine (LANTUS) 100 UNIT/ML Solostar Pen Inject 48 Units into the skin at bedtime.  . Insulin Pen Needle (PEN NEEDLES) 31G X 5 MM MISC 15 Units by Does not apply route 2 (two) times daily.  Marland Kitchen lidocaine (LIDODERM) 5 % APPLY PATCH ONTO THE SKIN AS DIRECTEDBY MD. LEAVE ON FOR UP TO 12 HOURS ONLY, THEN REMOVE AND DISCARD PATCH.  . metoprolol succinate (TOPROL-XL) 100 MG 24 hr tablet Take 1 tablet (100 mg total) by mouth daily. Take with or immediately following a meal.  . sacubitril-valsartan (ENTRESTO) 97-103 MG Take 1 tablet by mouth 2 (two) times daily.  Marland Kitchen spironolactone (ALDACTONE) 25 MG tablet Take 1 tablet (25 mg total) by mouth daily.  . vitamin B-12 (CYANOCOBALAMIN) 1000 MCG tablet Take 1 tablet (1,000 mcg total) by mouth daily.  . Vitamin D, Ergocalciferol, (DRISDOL) 1.25 MG (50000 UNIT) CAPS capsule Take 1 capsule (50,000 Units total) by mouth every 7 (seven) days.  . [DISCONTINUED] furosemide (LASIX) 40 MG tablet Take 1 tablet (40 mg total) by mouth daily.  . [DISCONTINUED] sacubitril-valsartan (ENTRESTO) 49-51 MG Take 1 tablet by mouth 2 (two) times daily.     Allergies:   Penicillins   Social History   Socioeconomic History  . Marital status: Single    Spouse name: Not on file  . Number  of children: Not on file  . Years of education: Not on file  . Highest education level: Not on file  Occupational History  . Not on file  Tobacco Use  . Smoking status: Current Some Day Smoker    Types: Cigarettes  . Smokeless tobacco: Never Used  . Tobacco comment: 1 cigarrette a day   Vaping Use  . Vaping Use: Never used  Substance and Sexual Activity  . Alcohol use: Yes    Comment: "socially"   . Drug use: Yes    Types: Marijuana  . Sexual activity: Not on file  Other Topics Concern  . Not on file  Social History Narrative   . Not on file   Social Determinants of Health   Financial Resource Strain: Not on file  Food Insecurity: Not on file  Transportation Needs: Not on file  Physical Activity: Not on file  Stress: Not on file  Social Connections: Not on file     Family History: The patient's family history is not on file.  ROS:   Please see the history of present illness.     All other systems reviewed and are negative.  EKGs/Labs/Other Studies Reviewed:    The following studies were reviewed today:   EKG:  EKG is ordered today. EKG shows normal sinus rhythm, first-degree AV block  Recent Labs: 10/26/2020: B Natriuretic Peptide 1,396.1 11/10/2020: Hemoglobin 13.2; Platelets 289 01/06/2021: ALT 26; BUN 8; Creatinine, Ser 0.82; Potassium 3.7; Sodium 137; TSH 2.550  Recent Lipid Panel    Component Value Date/Time   CHOL 136 01/06/2021 1257   TRIG 91 01/06/2021 1257   HDL 59 01/06/2021 1257   CHOLHDL 2.3 01/06/2021 1257   LDLCALC 60 01/06/2021 1257     Risk Assessment/Calculations:      Physical Exam:    VS:  BP 128/80 (BP Location: Left Arm, Patient Position: Sitting, Cuff Size: Normal)   Pulse 90   Ht 6' 1" (1.854 m)   Wt 184 lb (83.5 kg)   SpO2 99%   BMI 24.28 kg/m     Wt Readings from Last 3 Encounters:  02/19/21 184 lb (83.5 kg)  02/04/21 179 lb 9.6 oz (81.5 kg)  01/15/21 176 lb (79.8 kg)     GEN:  Well nourished, well developed in no acute distress HEENT: Normal NECK: No JVD; No carotid bruits LYMPHATICS: No lymphadenopathy CARDIAC: RRR, no murmurs, rubs, gallops RESPIRATORY:  Clear to auscultation without rales, wheezing or rhonchi  ABDOMEN: Soft, non-tender, non-distended MUSCULOSKELETAL:  No edema; No deformity  SKIN: Warm and dry NEUROLOGIC:  Alert and oriented x 3 PSYCHIATRIC:  Normal affect   ASSESSMENT:    1. NICM (nonischemic cardiomyopathy) ([place])   2. S/P implantation of automatic cardioverter/defibrillator (AICD)   3. Primary hypertension   4. Pure  hypercholesterolemia   5. Smoking    PLAN:    In order of problems listed above:  1. Nonischemic cardiomyopathy, EF < 20%.  NYHA class II symptoms, appears euvolemic.  Increase Entresto to 97-103 mg twice daily. Continue Toprol-XL to 100 mg daily,  Aldactone 57m daily. Decrease Lasix to 20 mg daily.  Plan to repeat echocardiogram in 5 months. 2. AICD implant. Keep appointment with EP/device clinic for monitoring. 3. Hypertension, BP controlled.  Continue current meds as above. 4. Hyperlipidemia, continue Lipitor. 5. Current smoker, smoking cessation recommended.  Follow-up in 6 months.   Medication Adjustments/Labs and Tests Ordered: Current medicines are reviewed at length with the patient today.  Concerns regarding  medicines are outlined above.  Orders Placed This Encounter  Procedures  . EKG 12-Lead  . ECHOCARDIOGRAM COMPLETE   Meds ordered this encounter  Medications  . furosemide (LASIX) 20 MG tablet    Sig: Take 1 tablet (20 mg total) by mouth daily.    Dispense:  30 tablet    Refill:  3    Dosage decrease  . sacubitril-valsartan (ENTRESTO) 97-103 MG    Sig: Take 1 tablet by mouth 2 (two) times daily.    Dispense:  60 tablet    Refill:  5    Patient Instructions  Medication Instructions:   Your physician has recommended you make the following change in your medication:   1.  INCREASE your Entresto to 97/103 MG twice a day.  2.  DECREASE your Lasix to 20 MG once daily.   *If you need a refill on your cardiac medications before your next appointment, please call your pharmacy*   Lab Work: None ordered If you have labs (blood work) drawn today and your tests are completely normal, you will receive your results only by: Marland Kitchen MyChart Message (if you have MyChart) OR . A paper copy in the mail If you have any lab test that is abnormal or we need to change your treatment, we will call you to review the results.   Testing/Procedures:  Your physician has  requested that you have an echocardiogram in 5 months. Echocardiography is a painless test that uses sound waves to create images of your heart. It provides your doctor with information about the size and shape of your heart and how well your heart's chambers and valves are working. This procedure takes approximately one hour. There are no restrictions for this procedure.     Follow-Up: At [facility], you and your health needs are our priority.  As part of our continuing mission to provide you with exceptional heart care, we have created designated Provider Care Teams.  These Care Teams include your primary Cardiologist (physician) and Advanced Practice Providers (APPs -  Physician Assistants and Nurse Practitioners) who all work together to provide you with the care you need, when you need it.  We recommend signing up for the patient portal called "MyChart".  Sign up information is provided on this After Visit Summary.  MyChart is used to connect with patients for Virtual Visits (Telemedicine).  Patients are able to view lab/test results, encounter notes, upcoming appointments, etc.  Non-urgent messages can be sent to your provider as well.   To learn more about what you can do with MyChart, go to [email or web address].    Your next appointment:   Follow up after Echo   The format for your next appointment:   In Person  Provider:   Kate Sable, MD  ONLY    Other Instructions  Samples of Entresto 49/51 MG were given to the patient, quantity 1 box, Lot Number [id number] 12/23    Signed, Kate Sable, MD  02/19/2021 5:09 PM    [place]

## 2021-02-23 ENCOUNTER — Telehealth: Payer: Self-pay | Admitting: Pharmacist

## 2021-02-23 ENCOUNTER — Other Ambulatory Visit: Payer: Self-pay

## 2021-02-23 NOTE — Telephone Encounter (Signed)
02/23/2021 2:45:06 PM - Sherryll Burger to [employer] for enrollment  -- [name] - Tuesday, February 23, 2021 2:43 PM --Faxed enrollment application to [employer] for [employer] 49/51 Take 1 tablet 2 times a day  ICD code: I42.8.   02/23/2021 2:43:41 PM - Faxed Novolog Flexpen & tips for enrollment  -- [name] - Tuesday, February 23, 2021 2:41 PM --Faxed enrollment application to [employer] for [employer] Inject 22 units 3 times a day # 6 boxes (max daily dose 66 units) & Novofine 32G tips # 3 boxes.

## 2021-02-24 ENCOUNTER — Other Ambulatory Visit: Payer: Self-pay

## 2021-02-24 ENCOUNTER — Ambulatory Visit: Payer: Medicaid Other | Admitting: Gerontology

## 2021-02-24 ENCOUNTER — Telehealth: Payer: Self-pay | Admitting: Cardiology

## 2021-02-24 ENCOUNTER — Encounter: Payer: Self-pay | Admitting: Gerontology

## 2021-02-24 ENCOUNTER — Other Ambulatory Visit: Payer: Self-pay | Admitting: Gerontology

## 2021-02-24 ENCOUNTER — Telehealth: Payer: Self-pay | Admitting: Pharmacist

## 2021-02-24 VITALS — BP 110/74 | HR 84 | Temp 97.1°F | Resp 16 | Wt 185.2 lb

## 2021-02-24 DIAGNOSIS — E119 Type 2 diabetes mellitus without complications: Secondary | ICD-10-CM

## 2021-02-24 DIAGNOSIS — E1165 Type 2 diabetes mellitus with hyperglycemia: Secondary | ICD-10-CM

## 2021-02-24 DIAGNOSIS — Z0000 Encounter for general adult medical examination without abnormal findings: Secondary | ICD-10-CM | POA: Insufficient documentation

## 2021-02-24 DIAGNOSIS — G629 Polyneuropathy, unspecified: Secondary | ICD-10-CM

## 2021-02-24 DIAGNOSIS — Z794 Long term (current) use of insulin: Secondary | ICD-10-CM

## 2021-02-24 MED ORDER — GABAPENTIN 300 MG PO CAPS: 300.0000 mg | ORAL_CAPSULE | Freq: Every day | ORAL | 2 refills | Status: DC

## 2021-02-24 MED ORDER — INSULIN ASPART 100 UNIT/ML FLEXPEN: 22.0000 [IU] | PEN_INJECTOR | Freq: Three times a day (TID) | SUBCUTANEOUS | 3 refills | Status: DC

## 2021-02-24 NOTE — Telephone Encounter (Signed)
Spoke with Thayer Ohm and informed him that we are supposed to be getting samples in tomorrow. He stated that he would be calling the patient, and he would let him know that I will be calling him tomorrow when the samples come in. His PAF has been approved, but they will not have the Entresto in stock until the beginning of May.

## 2021-02-24 NOTE — Telephone Encounter (Signed)
Thayer Ohm from [employer] calling  Patient does not have insurance and [employer] is not expecting their shipment of Entresto until the beginning of May  Would like to know if we would be able to provide patient with more samples while they are waiting  Thayer Ohm would like to speak with nurse, please call at [phone]

## 2021-02-24 NOTE — Telephone Encounter (Signed)
02/24/2021 3:13:46 PM - [employer] app for [employer] till 02/24/2022  -- [name] - Wednesday, February 24, 2021 3:12 PM -- Received faxed approval from [employer] for [employer] till 02/24/2022, ID# [id number].  02/24/2021 3:12:12 PM - Sherryll Burger dose increase to provider  -- [name] - Wednesday, February 24, 2021 3:10 PM --Thayer Ohm brought me a pharmacy printout for [facility] 97/103mg  Take one tablet by mouth 2 times a day  DOSE INCREASE-printed new provider portion of [employer] application and sending interoffice to [facility] for provider to sign and return. I will then fax to [employer].

## 2021-02-24 NOTE — Patient Instructions (Signed)
https://www.nhlbi.nih.gov/files/docs/public/heart/dash_brief.pdf">  DASH Eating Plan DASH stands for Dietary Approaches to Stop Hypertension. The DASH eating plan is a healthy eating plan that has been shown to:  Reduce high blood pressure (hypertension).  Reduce your risk for type 2 diabetes, heart disease, and stroke.  Help with weight loss. What are tips for following this plan? Reading food labels  Check food labels for the amount of salt (sodium) per serving. Choose foods with less than 5 percent of the Daily Value of sodium. Generally, foods with less than 300 milligrams (mg) of sodium per serving fit into this eating plan.  To find whole grains, look for the word "whole" as the first word in the ingredient list. Shopping  Buy products labeled as "low-sodium" or "no salt added."  Buy fresh foods. Avoid canned foods and pre-made or frozen meals. Cooking  Avoid adding salt when cooking. Use salt-free seasonings or herbs instead of table salt or sea salt. Check with your health care provider or pharmacist before using salt substitutes.  Do not fry foods. Cook foods using healthy methods such as baking, boiling, grilling, roasting, and broiling instead.  Cook with heart-healthy oils, such as olive, canola, avocado, soybean, or sunflower oil. Meal planning  Eat a balanced diet that includes: ? 4 or more servings of fruits and 4 or more servings of vegetables each day. Try to fill one-half of your plate with fruits and vegetables. ? 6-8 servings of whole grains each day. ? Less than 6 oz (170 g) of lean meat, poultry, or fish each day. A 3-oz (85-g) serving of meat is about the same size as a deck of cards. One egg equals 1 oz (28 g). ? 2-3 servings of low-fat dairy each day. One serving is 1 cup (237 mL). ? 1 serving of nuts, seeds, or beans 5 times each week. ? 2-3 servings of heart-healthy fats. Healthy fats called omega-3 fatty acids are found in foods such as walnuts,  flaxseeds, fortified milks, and eggs. These fats are also found in cold-water fish, such as sardines, salmon, and mackerel.  Limit how much you eat of: ? Canned or prepackaged foods. ? Food that is high in trans fat, such as some fried foods. ? Food that is high in saturated fat, such as fatty meat. ? Desserts and other sweets, sugary drinks, and other foods with added sugar. ? Full-fat dairy products.  Do not salt foods before eating.  Do not eat more than 4 egg yolks a week.  Try to eat at least 2 vegetarian meals a week.  Eat more home-cooked food and less restaurant, buffet, and fast food.   Lifestyle  When eating at a restaurant, ask that your food be prepared with less salt or no salt, if possible.  If you drink alcohol: ? Limit how much you use to:  0-1 drink a day for women who are not pregnant.  0-2 drinks a day for men. ? Be aware of how much alcohol is in your drink. In the [place] one drink equals one 12 oz bottle of beer (355 mL), one 5 oz glass of wine (148 mL), or one 1 oz glass of hard liquor (44 mL). General information  Avoid eating more than 2,300 mg of salt a day. If you have hypertension, you may need to reduce your sodium intake to 1,500 mg a day.  Work with your health care provider to maintain a healthy body weight or to lose weight. Ask what an ideal weight is for   you.  Get at least 30 minutes of exercise that causes your heart to beat faster (aerobic exercise) most days of the week. Activities may include walking, swimming, or biking.  Work with your health care provider or dietitian to adjust your eating plan to your individual calorie needs. What foods should I eat? Fruits All fresh, dried, or frozen fruit. Canned fruit in natural juice (without added sugar). Vegetables Fresh or frozen vegetables (raw, steamed, roasted, or grilled). Low-sodium or reduced-sodium tomato and vegetable juice. Low-sodium or reduced-sodium tomato sauce and tomato paste.  Low-sodium or reduced-sodium canned vegetables. Grains Whole-grain or whole-wheat bread. Whole-grain or whole-wheat pasta. Brown rice. Oatmeal. Quinoa. Bulgur. Whole-grain and low-sodium cereals. Pita bread. Low-fat, low-sodium crackers. Whole-wheat flour tortillas. Meats and other proteins Skinless chicken or turkey. Ground chicken or turkey. Pork with fat trimmed off. Fish and seafood. Egg whites. Dried beans, peas, or lentils. Unsalted nuts, nut butters, and seeds. Unsalted canned beans. Lean cuts of beef with fat trimmed off. Low-sodium, lean precooked or cured meat, such as sausages or meat loaves. Dairy Low-fat (1%) or fat-free (skim) milk. Reduced-fat, low-fat, or fat-free cheeses. Nonfat, low-sodium ricotta or cottage cheese. Low-fat or nonfat yogurt. Low-fat, low-sodium cheese. Fats and oils Soft margarine without trans fats. Vegetable oil. Reduced-fat, low-fat, or light mayonnaise and salad dressings (reduced-sodium). Canola, safflower, olive, avocado, soybean, and sunflower oils. Avocado. Seasonings and condiments Herbs. Spices. Seasoning mixes without salt. Other foods Unsalted popcorn and pretzels. Fat-free sweets. The items listed above may not be a complete list of foods and beverages you can eat. Contact a dietitian for more information. What foods should I avoid? Fruits Canned fruit in a light or heavy syrup. Fried fruit. Fruit in cream or butter sauce. Vegetables Creamed or fried vegetables. Vegetables in a cheese sauce. Regular canned vegetables (not low-sodium or reduced-sodium). Regular canned tomato sauce and paste (not low-sodium or reduced-sodium). Regular tomato and vegetable juice (not low-sodium or reduced-sodium). Pickles. Olives. Grains Baked goods made with fat, such as croissants, muffins, or some breads. Dry pasta or rice meal packs. Meats and other proteins Fatty cuts of meat. Ribs. Fried meat. Bacon. Bologna, salami, and other precooked or cured meats, such as  sausages or meat loaves. Fat from the back of a pig (fatback). Bratwurst. Salted nuts and seeds. Canned beans with added salt. Canned or smoked fish. Whole eggs or egg yolks. Chicken or turkey with skin. Dairy Whole or 2% milk, cream, and half-and-half. Whole or full-fat cream cheese. Whole-fat or sweetened yogurt. Full-fat cheese. Nondairy creamers. Whipped toppings. Processed cheese and cheese spreads. Fats and oils Butter. Stick margarine. Lard. Shortening. Ghee. Bacon fat. Tropical oils, such as coconut, palm kernel, or palm oil. Seasonings and condiments Onion salt, garlic salt, seasoned salt, table salt, and sea salt. Worcestershire sauce. Tartar sauce. Barbecue sauce. Teriyaki sauce. Soy sauce, including reduced-sodium. Steak sauce. Canned and packaged gravies. Fish sauce. Oyster sauce. Cocktail sauce. Store-bought horseradish. Ketchup. Mustard. Meat flavorings and tenderizers. Bouillon cubes. Hot sauces. Pre-made or packaged marinades. Pre-made or packaged taco seasonings. Relishes. Regular salad dressings. Other foods Salted popcorn and pretzels. The items listed above may not be a complete list of foods and beverages you should avoid. Contact a dietitian for more information. Where to find more information  National Heart, Lung, and Blood Institute: www.nhlbi.nih.gov  American Heart Association: www.heart.org  Academy of Nutrition and Dietetics: www.eatright.org  National Kidney Foundation: www.kidney.org Summary  The DASH eating plan is a healthy eating plan that has been shown to   reduce high blood pressure (hypertension). It may also reduce your risk for type 2 diabetes, heart disease, and stroke.  When on the DASH eating plan, aim to eat more fresh fruits and vegetables, whole grains, lean proteins, low-fat dairy, and heart-healthy fats.  With the DASH eating plan, you should limit salt (sodium) intake to 2,300 mg a day. If you have hypertension, you may need to reduce your  sodium intake to 1,500 mg a day.  Work with your health care provider or dietitian to adjust your eating plan to your individual calorie needs. This information is not intended to replace advice given to you by your health care provider. Make sure you discuss any questions you have with your health care provider. Document Revised: 10/18/2019 Document Reviewed: 10/18/2019 Elsevier Patient Education  2021 Elsevier Inc. https://www.diabeteseducator.org/docs/default-source/living-with-diabetes/conquering-the-grocery-store-v1.pdf?sfvrsn=4">  Carbohydrate Counting for Diabetes Mellitus, Adult Carbohydrate counting is a method of keeping track of how many carbohydrates you eat. Eating carbohydrates naturally increases the amount of sugar (glucose) in the blood. Counting how many carbohydrates you eat improves your blood glucose control, which helps you manage your diabetes. It is important to know how many carbohydrates you can safely have in each meal. This is different for every person. A dietitian can help you make a meal plan and calculate how many carbohydrates you should have at each meal and snack. What foods contain carbohydrates? Carbohydrates are found in the following foods:  Grains, such as breads and cereals.  Dried beans and soy products.  Starchy vegetables, such as potatoes, peas, and corn.  Fruit and fruit juices.  Milk and yogurt.  Sweets and snack foods, such as cake, cookies, candy, chips, and soft drinks.   How do I count carbohydrates in foods? There are two ways to count carbohydrates in food. You can read food labels or learn standard serving sizes of foods. You can use either of the methods or a combination of both. Using the Nutrition Facts label The Nutrition Facts list is included on the labels of almost all packaged foods and beverages in the [place] It includes:  The serving size.  Information about nutrients in each serving, including the grams (g) of carbohydrate  per serving. To use the Nutrition Facts:  Decide how many servings you will have.  Multiply the number of servings by the number of carbohydrates per serving.  The resulting number is the total amount of carbohydrates that you will be having. Learning the standard serving sizes of foods When you eat carbohydrate foods that are not packaged or do not include Nutrition Facts on the label, you need to measure the servings in order to count the amount of carbohydrates.  Measure the foods that you will eat with a food scale or measuring cup, if needed.  Decide how many standard-size servings you will eat.  Multiply the number of servings by 15. For foods that contain carbohydrates, one serving equals 15 g of carbohydrates. ? For example, if you eat 2 cups or 10 oz (300 g) of strawberries, you will have eaten 2 servings and 30 g of carbohydrates (2 servings x 15 g = 30 g).  For foods that have more than one food mixed, such as soups and casseroles, you must count the carbohydrates in each food that is included. The following list contains standard serving sizes of common carbohydrate-rich foods. Each of these servings has about 15 g of carbohydrates:  1 slice of bread.  1 six-inch (15 cm) tortilla.  ? cup or 2   oz (53 g) cooked rice or pasta.   cup or 3 oz (85 g) cooked or canned, drained and rinsed beans or lentils.   cup or 3 oz (85 g) starchy vegetable, such as peas, corn, or squash.   cup or 4 oz (120 g) hot cereal.   cup or 3 oz (85 g) boiled or mashed potatoes, or  or 3 oz (85 g) of a large baked potato.   cup or 4 fl oz (118 mL) fruit juice.  1 cup or 8 fl oz (237 mL) milk.  1 small or 4 oz (106 g) apple.   or 2 oz (63 g) of a medium banana.  1 cup or 5 oz (150 g) strawberries.  3 cups or 1 oz (24 g) popped popcorn. What is an example of carbohydrate counting? To calculate the number of carbohydrates in this sample meal, follow the steps shown below. Sample  meal  3 oz (85 g) chicken breast.  ? cup or 4 oz (106 g) brown rice.   cup or 3 oz (85 g) corn.  1 cup or 8 fl oz (237 mL) milk.  1 cup or 5 oz (150 g) strawberries with sugar-free whipped topping. Carbohydrate calculation 1. Identify the foods that contain carbohydrates: ? Rice. ? Corn. ? Milk. ? Strawberries. 2. Calculate how many servings you have of each food: ? 2 servings rice. ? 1 serving corn. ? 1 serving milk. ? 1 serving strawberries. 3. Multiply each number of servings by 15 g: ? 2 servings rice x 15 g = 30 g. ? 1 serving corn x 15 g = 15 g. ? 1 serving milk x 15 g = 15 g. ? 1 serving strawberries x 15 g = 15 g. 4. Add together all of the amounts to find the total grams of carbohydrates eaten: ? 30 g + 15 g + 15 g + 15 g = 75 g of carbohydrates total. What are tips for following this plan? Shopping  Develop a meal plan and then make a shopping list.  Buy fresh and frozen vegetables, fresh and frozen fruit, dairy, eggs, beans, lentils, and whole grains.  Look at food labels. Choose foods that have more fiber and less sugar.  Avoid processed foods and foods with added sugars. Meal planning  Aim to have the same amount of carbohydrates at each meal and for each snack time.  Plan to have regular, balanced meals and snacks. Where to find more information  American Diabetes Association: www.diabetes.org  Centers for Disease Control and Prevention: www.cdc.gov Summary  Carbohydrate counting is a method of keeping track of how many carbohydrates you eat.  Eating carbohydrates naturally increases the amount of sugar (glucose) in the blood.  Counting how many carbohydrates you eat improves your blood glucose control, which helps you manage your diabetes.  A dietitian can help you make a meal plan and calculate how many carbohydrates you should have at each meal and snack. This information is not intended to replace advice given to you by your health care  provider. Make sure you discuss any questions you have with your health care provider. Document Revised: 11/14/2019 Document Reviewed: 11/15/2019 Elsevier Patient Education  2021 Elsevier Inc.  

## 2021-02-24 NOTE — Progress Notes (Signed)
Established Patient Office Visit  Subjective:  Patient ID: Travis Lucas, male    DOB: [birth year]  Age: 50 y.o. MRN: [id number]  CC: No chief complaint on file.   HPI Travis Lucas is a 50 y.o.malewith a h/o T2DM, CAD, ischemic cardiomyopathy s/p AICD placement, Combined D/S Heart failure and hypertensive heart disease who presents today for routine follow up. His HgbA1c done on 01/06/21 was 11.5%. He states that he's compliant with his medication regimen and continues to make healthy lifestyle changes. He checks his blood glucose bid, and was 83 mg/dl when he checked it last night. He denies hypo/hyperglycemia, and states that peripheral neuropathy is improving with taking gabapentin. He was seen by Cardiologist Dr Garen Lah on 02/19/21, his Delene Loll was increased to 97-103 mg bid, continue on 100 mg Toprol-XL, Aldactone 25 mg daily and Furosemide was decreased to 20 mg daily and will have Echocardiogram done in 5 months. Overall, he states that he's doing well and offers no further complaint.  Past Medical History:  Diagnosis Date  . BOOP (bronchiolitis obliterans with organizing pneumonia) ([place])   . CHF (congestive heart failure) ([place])   . Chronic back pain   . Diabetes mellitus without complication ([place])   . Hyperlipidemia   . Hypertension   . Ischemic cardiomyopathy with implantable cardioverter-defibrillator (ICD)     Past Surgical History:  Procedure Laterality Date  . CARDIAC DEFIBRILLATOR PLACEMENT  09/10/2019  . CHOLECYSTECTOMY      No family history on file.  Social History   Socioeconomic History  . Marital status: Single    Spouse name: Not on file  . Number of children: Not on file  . Years of education: Not on file  . Highest education level: Not on file  Occupational History  . Not on file  Tobacco Use  . Smoking status: Current Some Day Smoker    Types: Cigarettes  . Smokeless tobacco: Never Used  . Tobacco comment: 1 cigarrette a day   Vaping Use  .  Vaping Use: Never used  Substance and Sexual Activity  . Alcohol use: Yes    Comment: "socially"   . Drug use: Yes    Types: Marijuana  . Sexual activity: Not on file  Other Topics Concern  . Not on file  Social History Narrative  . Not on file   Social Determinants of Health   Financial Resource Strain: Not on file  Food Insecurity: Not on file  Transportation Needs: Not on file  Physical Activity: Not on file  Stress: Not on file  Social Connections: Not on file  Intimate Partner Violence: Not on file    Outpatient Medications Prior to Visit  Medication Sig Dispense Refill  . Insulin Pen Needle (PEN NEEDLES) 31G X 5 MM MISC 15 Units by Does not apply route 2 (two) times daily. 100 each 6  . atorvastatin (LIPITOR) 80 MG tablet Take 1 tablet (80 mg total) by mouth daily. 90 tablet 1  . furosemide (LASIX) 20 MG tablet Take 1 tablet (20 mg total) by mouth daily. 30 tablet 3  . gabapentin (NEURONTIN) 300 MG capsule Take 1 capsule (300 mg total) by mouth at bedtime. 30 capsule 0  . insulin aspart (NOVOLOG) 100 UNIT/ML FlexPen Inject 22 Units into the skin 3 (three) times daily with meals. 15 mL 1  . insulin glargine (LANTUS) 100 UNIT/ML Solostar Pen Inject 48 Units into the skin at bedtime. 15 mL 11  . lidocaine (LIDODERM) 5 % APPLY PATCH ONTO THE  SKIN AS DIRECTEDBY MD. LEAVE ON FOR UP TO 12 HOURS ONLY, THEN REMOVE AND DISCARD PATCH. 30 patch 0  . metoprolol succinate (TOPROL-XL) 100 MG 24 hr tablet Take 1 tablet (100 mg total) by mouth daily. Take with or immediately following a meal. 30 tablet 5  . sacubitril-valsartan (ENTRESTO) 97-103 MG Take 1 tablet by mouth 2 (two) times daily. 60 tablet 5  . spironolactone (ALDACTONE) 25 MG tablet Take 1 tablet (25 mg total) by mouth daily. 90 tablet 1  . Vitamin D, Ergocalciferol, (DRISDOL) 1.25 MG (50000 UNIT) CAPS capsule Take 1 capsule (50,000 Units total) by mouth every 7 (seven) days. 5 capsule 1  . blood glucose meter kit and supplies  KIT Dispense based on patient and insurance preference. Use up to four times daily as directed. (FOR ICD-9 250.00, 250.01). 1 each 0  . vitamin B-12 (CYANOCOBALAMIN) 1000 MCG tablet Take 1 tablet (1,000 mcg total) by mouth daily. 90 tablet 1   No facility-administered medications prior to visit.    Allergies  Allergen Reactions  . Penicillins     ROS Review of Systems  Constitutional: Negative.   Eyes: Negative.   Respiratory: Negative.   Cardiovascular: Negative.   Endocrine: Negative.   Skin: Negative.   Neurological: Negative.       Objective:    Physical Exam HENT:     Head: Normocephalic and atraumatic.  Eyes:     Extraocular Movements: Extraocular movements intact.     Conjunctiva/sclera: Conjunctivae normal.     Pupils: Pupils are equal, round, and reactive to light.  Cardiovascular:     Rate and Rhythm: Normal rate and regular rhythm.     Pulses: Normal pulses.     Heart sounds: Normal heart sounds.  Pulmonary:     Effort: Pulmonary effort is normal.     Breath sounds: Normal breath sounds.  Skin:    General: Skin is warm.  Neurological:     General: No focal deficit present.     Mental Status: He is alert and oriented to person, place, and time. Mental status is at baseline.  Psychiatric:        Mood and Affect: Mood normal.        Behavior: Behavior normal.        Thought Content: Thought content normal.        Judgment: Judgment normal.     BP 110/74 (BP Location: Right Arm, Patient Position: Sitting, Cuff Size: Normal)   Pulse 84   Temp (!) 97.1 F (36.2 C)   Resp 16   Wt 185 lb 3.2 oz (84 kg)   SpO2 99%   BMI 24.43 kg/m  Wt Readings from Last 3 Encounters:  02/24/21 185 lb 3.2 oz (84 kg)  02/19/21 184 lb (83.5 kg)  02/04/21 179 lb 9.6 oz (81.5 kg)     Health Maintenance Due  Topic Date Due  . Hepatitis C Screening  Never done  . PNEUMOCOCCAL POLYSACCHARIDE VACCINE AGE 28-64 HIGH RISK  Never done  . COVID-19 Vaccine (1) Never done  .  OPHTHALMOLOGY EXAM  Never done  . TETANUS/TDAP  Never done  . COLONOSCOPY (Pts 45-66yrs Insurance coverage will need to be confirmed)  Never done    There are no preventive care reminders to display for this patient.  Lab Results  Component Value Date   TSH 2.550 01/06/2021   Lab Results  Component Value Date   WBC 4.5 11/10/2020   HGB 13.2 11/10/2020   HCT  41.5 11/10/2020   MCV 89 11/10/2020   PLT 289 11/10/2020   Lab Results  Component Value Date   NA 137 01/06/2021   K 3.7 01/06/2021   CO2 25 01/06/2021   GLUCOSE 442 (H) 01/06/2021   BUN 8 01/06/2021   CREATININE 0.82 01/06/2021   BILITOT 0.7 01/06/2021   ALKPHOS 177 (H) 01/06/2021   AST 27 01/06/2021   ALT 26 01/06/2021   PROT 6.3 01/06/2021   ALBUMIN 3.7 (L) 01/06/2021   CALCIUM 8.7 01/06/2021   ANIONGAP 7 11/02/2020   Lab Results  Component Value Date   CHOL 136 01/06/2021   Lab Results  Component Value Date   HDL 59 01/06/2021   Lab Results  Component Value Date   LDLCALC 60 01/06/2021   Lab Results  Component Value Date   TRIG 91 01/06/2021   Lab Results  Component Value Date   CHOLHDL 2.3 01/06/2021   Lab Results  Component Value Date   HGBA1C 11.5 (H) 01/06/2021      Assessment & Plan:    1. Peripheral polyneuropathy - His peripheral neuropathy is improving, and he will continue on gabapentin  2. Uncontrolled type 2 diabetes mellitus with hyperglycemia (HCC) - His HgbA1c was 11.5% and his goal should be less than 7%, he will continue on current insulin, check blood glucose tid, record and bring lo to follow up appointment. Continue on low carb/non concentrated sweet unit - Insulin Glargine (BASAGLAR KWIKPEN) 100 UNIT/ML; INJECT 48 UNITS INTO THE SKIN AT BEDTIME (REPLACES LANTUS)  Dispense: 5 mL; Refill: 11 - HgB A1c; Future   3. Health care maintenance  - Ambulatory referral to Ophthalmology with Dr Waldemar Dickens - Ambulatory referral to Gastroenterology for Colonoscopy screening - Comp  Met (CMET); Future    Follow-up: Return in about 7 weeks (around 04/14/2021), or if symptoms worsen or fail to improve.    Alazne Quant Jerold Coombe, NP

## 2021-02-25 NOTE — Telephone Encounter (Signed)
Spoke to patient and made aware of the plan as documented below. Routing to scheduling for changes.

## 2021-02-25 NOTE — Telephone Encounter (Signed)
Received Entresto samples . Only 2 bottles 49-51 MG given. Spoke to Dr. Azucena Cecil and he said okay to keep patient at 61-51 MG until Medication Management can fill his 97-103 MG. He recommended that we also push his follow up appointment and Echocardiogram out 1 month due to being unable to increase his Entresto dose until early May.  Set aside the 2 bottles of  Entresto samples (1 moth supply) at front desk for patient to pick up. Lot # [id number]  Exp) 2024  Called and left patient a VM to call back.  Will route to scheduling for above mentioned adjustments.

## 2021-02-25 NOTE — Telephone Encounter (Signed)
LMOV to reschedule  

## 2021-03-01 MED ORDER — BASAGLAR KWIKPEN 100 UNIT/ML ~~LOC~~ SOPN
PEN_INJECTOR | SUBCUTANEOUS | 11 refills | Status: DC
  Filled 2021-03-01: qty 15, 31d supply, fill #0

## 2021-03-02 ENCOUNTER — Other Ambulatory Visit: Payer: Self-pay

## 2021-03-03 ENCOUNTER — Other Ambulatory Visit: Payer: Self-pay

## 2021-03-09 NOTE — Telephone Encounter (Signed)
Attempted to reschedule. Not available . Lm to call back .

## 2021-03-10 ENCOUNTER — Telehealth: Payer: Self-pay | Admitting: Pharmacist

## 2021-03-10 NOTE — Telephone Encounter (Signed)
03/10/2021 11:31:38 AM - Travis Lucas 97/103 faxed to [employer]  -- [name] - Wednesday, March 10, 2021 11:30 AM --Arneta Cliche Dose Increase for Entresto 97/103 Take 1 tablet by mouth two times a day for processing.

## 2021-03-17 ENCOUNTER — Other Ambulatory Visit: Payer: Self-pay

## 2021-03-17 ENCOUNTER — Ambulatory Visit: Payer: Medicaid Other | Admitting: Gerontology

## 2021-03-17 ENCOUNTER — Encounter: Payer: Self-pay | Admitting: Gerontology

## 2021-03-17 VITALS — BP 91/66 | HR 68 | Temp 97.8°F | Resp 16 | Wt 188.1 lb

## 2021-03-17 DIAGNOSIS — G629 Polyneuropathy, unspecified: Secondary | ICD-10-CM

## 2021-03-17 DIAGNOSIS — E1169 Type 2 diabetes mellitus with other specified complication: Secondary | ICD-10-CM

## 2021-03-17 DIAGNOSIS — Z87438 Personal history of other diseases of male genital organs: Secondary | ICD-10-CM | POA: Insufficient documentation

## 2021-03-17 MED ORDER — GABAPENTIN 300 MG PO CAPS
ORAL_CAPSULE | Freq: Every day | ORAL | 2 refills | Status: DC
  Filled 2021-03-17: qty 30, 30d supply, fill #0

## 2021-03-17 MED ORDER — ATORVASTATIN CALCIUM 80 MG PO TABS
ORAL_TABLET | Freq: Every day | ORAL | 1 refills | Status: DC
  Filled 2021-03-17 – 2021-06-28 (×2): qty 90, 90d supply, fill #0

## 2021-03-17 NOTE — Progress Notes (Signed)
Established Patient Office Visit  Subjective:  Patient ID: Travis Lucas, male    DOB: [birth year]  Age: 50 y.o. MRN: [id number]  CC: No chief complaint on file.   HPI Travis Lucas  a 50 y.o.male with a h/o T2DM, CAD, ischemic cardiomyopathy s/p AICD placement, Combined D/S Heart failure and hypertensive heart disease who presents to request a prescription for Cialis and medication refill. He states that he has a history of Erectile Dysfunction (ED) and was using Viagra in the past. He states that he last used Viagra in 2019. He continues to experience difficulty achieving and maintaining erection. He states that taking 300 mg gabapentin moderately relieves peripheral neuropathy.Overall, he states that he's doing well and offers no further complaint.  Past Medical History:  Diagnosis Date  . BOOP (bronchiolitis obliterans with organizing pneumonia) ([place])   . CHF (congestive heart failure) ([place])   . Chronic back pain   . Diabetes mellitus without complication ([place])   . Hyperlipidemia   . Hypertension   . Ischemic cardiomyopathy with implantable cardioverter-defibrillator (ICD)     Past Surgical History:  Procedure Laterality Date  . CARDIAC DEFIBRILLATOR PLACEMENT  09/10/2019  . CHOLECYSTECTOMY      No family history on file.  Social History   Socioeconomic History  . Marital status: Single    Spouse name: Not on file  . Number of children: Not on file  . Years of education: Not on file  . Highest education level: Not on file  Occupational History  . Not on file  Tobacco Use  . Smoking status: Current Some Day Smoker    Types: Cigarettes  . Smokeless tobacco: Never Used  . Tobacco comment: 1 cigarrette a day   Vaping Use  . Vaping Use: Never used  Substance and Sexual Activity  . Alcohol use: Yes    Comment: "socially"   . Drug use: Yes    Types: Marijuana  . Sexual activity: Not on file  Other Topics Concern  . Not on file  Social History Narrative  . Not on  file   Social Determinants of Health   Financial Resource Strain: Not on file  Food Insecurity: Not on file  Transportation Needs: Not on file  Physical Activity: Not on file  Stress: Not on file  Social Connections: Not on file  Intimate Partner Violence: Not on file    Outpatient Medications Prior to Visit  Medication Sig Dispense Refill  . furosemide (LASIX) 20 MG tablet TAKE ONE TABLET BY MOUTH EVERY DAY 30 tablet 3  . insulin aspart (NOVOLOG) 100 UNIT/ML FlexPen INJECT 22 UNITS INTO THE SKIN 3 TIMES A DAY WITH MEALS 15 mL 3  . Insulin Glargine (BASAGLAR KWIKPEN) 100 UNIT/ML INJECT 48 UNITS INTO THE SKIN AT BEDTIME (REPLACES LANTUS) 5 mL 11  . lidocaine (LIDODERM) 5 % APPLY PATCH ONTO THE SKIN AS DIRECTED BY MD. LEAVE ON FOR UP TO 12 HOURS ONLY, THEN REMOVE AND DISCARD PATCH. 30 patch 1  . metoprolol succinate (TOPROL-XL) 100 MG 24 hr tablet TAKE ONE TABLET BY MOUTH. TAKE WITH OR IMMEDIATELY FOLLOWING A MEAL 30 tablet 5  . sacubitril-valsartan (ENTRESTO) 97-103 MG TAKE ONE TABLET BY MOUTH 2 TIMES A DAY 60 tablet 5  . spironolactone (ALDACTONE) 25 MG tablet TAKE ONE TABLET BY MOUTH EVERY DAY 90 tablet 1  . Vitamin D, Ergocalciferol, (DRISDOL) 1.25 MG (50000 UNIT) CAPS capsule TAKE ONE CAPSULE BY MOUTH ONCE EVERY WEEK ON THE SAME DAY 5 capsule 1  .  gabapentin (NEURONTIN) 300 MG capsule TAKE ONE CAPSULE BY MOUTH AT BEDTIME 30 capsule 2  . blood glucose meter kit and supplies KIT Dispense based on patient and insurance preference. Use up to four times daily as directed. (FOR ICD-9 250.00, 250.01). 1 each 0  . COMFORT EZ PEN NEEDLES 32G X 4 MM MISC AS DIRECTED 100 each 99  . Insulin Pen Needle (PEN NEEDLES) 31G X 5 MM MISC 15 Units by Does not apply route 2 (two) times daily. 100 each 6  . Insulin Pen Needle 32G X 4 MM MISC USE WITH INSULIN 2 TIMES A DAY 100 each 6  . RIGHTEST [id number] BLOOD GLUCOSE test strip AS DIRECTED 100 strip 99  . vitamin B-12 (CYANOCOBALAMIN) 1000 MCG tablet Take 1  tablet (1,000 mcg total) by mouth daily. (Patient not taking: Reported on 03/17/2021) 90 tablet 1  . atorvastatin (LIPITOR) 80 MG tablet TAKE ONE TABLET BY MOUTH EVERY DAY 90 tablet 1   No facility-administered medications prior to visit.    Allergies  Allergen Reactions  . Penicillins     ROS Review of Systems  Constitutional: Negative.   Respiratory: Negative.   Cardiovascular: Negative.   Genitourinary: Negative.   Neurological: Negative.   Psychiatric/Behavioral: Negative.       Objective:    Physical Exam Cardiovascular:     Rate and Rhythm: Normal rate and regular rhythm.     Pulses: Normal pulses.     Heart sounds: Normal heart sounds.  Pulmonary:     Effort: Pulmonary effort is normal.     Breath sounds: Normal breath sounds.  Neurological:     General: No focal deficit present.     Mental Status: He is alert and oriented to person, place, and time. Mental status is at baseline.  Psychiatric:        Mood and Affect: Mood normal.        Behavior: Behavior normal.        Thought Content: Thought content normal.        Judgment: Judgment normal.     BP 91/66 (BP Location: Left Arm, Patient Position: Sitting, Cuff Size: Large)   Pulse 68   Temp 97.8 F (36.6 C)   Resp 16   Wt 188 lb 1.6 oz (85.3 kg)   SpO2 98%   BMI 24.82 kg/m  Wt Readings from Last 3 Encounters:  03/17/21 188 lb 1.6 oz (85.3 kg)  02/24/21 185 lb 3.2 oz (84 kg)  02/19/21 184 lb (83.5 kg)     Health Maintenance Due  Topic Date Due  . Hepatitis C Screening  Never done  . PNEUMOCOCCAL POLYSACCHARIDE VACCINE AGE 30-64 HIGH RISK  Never done  . COVID-19 Vaccine (1) Never done  . OPHTHALMOLOGY EXAM  Never done  . TETANUS/TDAP  Never done  . COLONOSCOPY (Pts 45-41yrs Insurance coverage will need to be confirmed)  Never done    There are no preventive care reminders to display for this patient.  Lab Results  Component Value Date   TSH 2.550 01/06/2021   Lab Results  Component Value  Date   WBC 4.5 11/10/2020   HGB 13.2 11/10/2020   HCT 41.5 11/10/2020   MCV 89 11/10/2020   PLT 289 11/10/2020   Lab Results  Component Value Date   NA 137 01/06/2021   K 3.7 01/06/2021   CO2 25 01/06/2021   GLUCOSE 442 (H) 01/06/2021   BUN 8 01/06/2021   CREATININE 0.82 01/06/2021   BILITOT 0.7  01/06/2021   ALKPHOS 177 (H) 01/06/2021   AST 27 01/06/2021   ALT 26 01/06/2021   PROT 6.3 01/06/2021   ALBUMIN 3.7 (L) 01/06/2021   CALCIUM 8.7 01/06/2021   ANIONGAP 7 11/02/2020   Lab Results  Component Value Date   CHOL 136 01/06/2021   Lab Results  Component Value Date   HDL 59 01/06/2021   Lab Results  Component Value Date   LDLCALC 60 01/06/2021   Lab Results  Component Value Date   TRIG 91 01/06/2021   Lab Results  Component Value Date   CHOLHDL 2.3 01/06/2021   Lab Results  Component Value Date   HGBA1C 11.5 (H) 01/06/2021      Assessment & Plan:    1. History of erectile dysfunction - He will follow up with  Urology. - Ambulatory referral to Urology  2. Peripheral polyneuropathy - His peripheral neuropathy is improving and he will continue on current medication regimen. - gabapentin (NEURONTIN) 300 MG capsule; TAKE ONE CAPSULE BY MOUTH AT BEDTIME  Dispense: 30 capsule; Refill: 2  3. Hyperlipidemia associated with type 2 diabetes mellitus ([place]) - He will continue on current treatment regimen, low fat/cholesterol diet. - atorvastatin (LIPITOR) 80 MG tablet; TAKE ONE TABLET BY MOUTH EVERY DAY  Dispense: 90 tablet; Refill: 1    Follow-up: Return in about 4 weeks (around 04/14/2021), or if symptoms worsen or fail to improve.    Travis Lucas Jerold Coombe, NP

## 2021-03-18 ENCOUNTER — Other Ambulatory Visit: Payer: Self-pay

## 2021-03-19 ENCOUNTER — Other Ambulatory Visit: Payer: Self-pay

## 2021-03-21 MED FILL — Insulin Aspart Soln Pen-injector 100 Unit/ML: SUBCUTANEOUS | 23 days supply | Qty: 15 | Fill #0 | Status: CN

## 2021-03-21 MED FILL — Sacubitril-Valsartan Tab 97-103 MG: ORAL | 30 days supply | Qty: 60 | Fill #0 | Status: CN

## 2021-03-22 ENCOUNTER — Other Ambulatory Visit: Payer: Self-pay

## 2021-03-22 MED ORDER — MIRTAZAPINE 15 MG PO TABS
ORAL_TABLET | ORAL | 1 refills | Status: DC
  Filled 2021-03-22: qty 30, 30d supply, fill #0
  Filled 2021-05-03: qty 30, 30d supply, fill #1

## 2021-03-23 ENCOUNTER — Other Ambulatory Visit: Payer: Self-pay

## 2021-03-24 ENCOUNTER — Other Ambulatory Visit: Payer: Self-pay

## 2021-03-24 ENCOUNTER — Encounter: Payer: Self-pay | Admitting: Family

## 2021-03-24 ENCOUNTER — Ambulatory Visit: Payer: Self-pay | Attending: Family | Admitting: Family

## 2021-03-24 VITALS — BP 106/69 | HR 85 | Resp 18 | Ht 74.0 in | Wt 175.0 lb

## 2021-03-24 DIAGNOSIS — Z794 Long term (current) use of insulin: Secondary | ICD-10-CM | POA: Insufficient documentation

## 2021-03-24 DIAGNOSIS — G4700 Insomnia, unspecified: Secondary | ICD-10-CM | POA: Insufficient documentation

## 2021-03-24 DIAGNOSIS — E1165 Type 2 diabetes mellitus with hyperglycemia: Secondary | ICD-10-CM | POA: Insufficient documentation

## 2021-03-24 DIAGNOSIS — I10 Essential (primary) hypertension: Secondary | ICD-10-CM

## 2021-03-24 DIAGNOSIS — Z95810 Presence of automatic (implantable) cardiac defibrillator: Secondary | ICD-10-CM | POA: Insufficient documentation

## 2021-03-24 DIAGNOSIS — F17210 Nicotine dependence, cigarettes, uncomplicated: Secondary | ICD-10-CM | POA: Insufficient documentation

## 2021-03-24 DIAGNOSIS — E785 Hyperlipidemia, unspecified: Secondary | ICD-10-CM | POA: Insufficient documentation

## 2021-03-24 DIAGNOSIS — I110 Hypertensive heart disease with heart failure: Secondary | ICD-10-CM | POA: Insufficient documentation

## 2021-03-24 DIAGNOSIS — I5022 Chronic systolic (congestive) heart failure: Secondary | ICD-10-CM | POA: Insufficient documentation

## 2021-03-24 LAB — GLUCOSE, CAPILLARY: Glucose-Capillary: 89 mg/dL (ref 70–99)

## 2021-03-24 MED FILL — Insulin Aspart Soln Pen-injector 100 Unit/ML: SUBCUTANEOUS | 90 days supply | Qty: 60 | Fill #0 | Status: AC

## 2021-03-24 NOTE — Progress Notes (Signed)
Travis Lucas - PHARMACIST COUNSELING NOTE  ADHERENCE ASSESSMENT   Do you ever forget to take your medication? [x] Yes (1) [] No (0)  Do you ever skip doses due to side effects? [] Yes (1) [x] No (0)  Do you have trouble affording your medicines? [] Yes (1) [x] No (0)  Are you ever unable to pick up your medication due to transportation difficulties? [] Yes (1) [x] No (0)  Do you ever stop taking your medications because you don't believe they are helping? [] Yes (1) [x] No (0)  Total score _0______    Recommendations given to patient about increasing adherence: Setting alarms to help remember medication doses  Guideline-Directed Medical Therapy/Evidence Based Medicine  ACE/ARB/ARNI: [place] Corporal Blocker: metoprolol succinate  Aldosterone Antagonist: Spironolactone Diuretic: furosemide    SUBJECTIVE  HPI:  Past Medical History:  Diagnosis Date  . BOOP (bronchiolitis obliterans with organizing pneumonia) ([place])   . CHF (congestive heart failure) ([place])   . Chronic back pain   . Diabetes mellitus without complication ([place])   . Hyperlipidemia   . Hypertension   . Ischemic cardiomyopathy with implantable cardioverter-defibrillator (ICD)         OBJECTIVE   Vital signs: HR 85, BP 106/69, weight 79.4 kg ECHO: Date 10/27/20, EF < 20%  BMP Latest Ref Rng & Units 01/06/2021 11/10/2020 11/02/2020  Glucose 65 - 99 mg/dL 442(H) 463(H) 184(H)  BUN 6 - 24 mg/dL 8 16 18   Creatinine 0.76 - 1.27 mg/dL 0.82 0.93 0.91  BUN/Creat Ratio 9 - 20 10 17  -  Sodium 134 - 144 mmol/L 137 134 137  Potassium 3.5 - 5.2 mmol/L 3.7 4.5 4.0  Chloride 96 - 106 mmol/L 96 98 102  CO2 20 - 29 mmol/L 25 21 28   Calcium 8.7 - 10.2 mg/dL 8.7 8.1(L) 7.9(L)    ASSESSMENT 50 yo M presenting to heart failure clinic for follow-up visit. PMH includes HTN, HLD, diabetes, back pain, and CHF. Pt forgets to take doses frequently, recommend setting alarms to help remember  medication doses. No additional barriers to adherence identified during medication reconciliation.   PLAN CHF/HTN - Continue furosemide 20 mg daily  - Continue metoprolol succinate 100 mg daily  - Continue Entresto 97-103 mg twice daily  - Continue spironolactone 25 mg daily  Diabetes/HLD - Continue atorvastatin 80 mg daily. Caution drinking grapefruit with atorvastatin.  - Pt reports BG of 400 this morning. Pt feeling lightheaded and dizzy during visit, repeat BG 89. Pt had grits and grapefruit juice this morning. Currently on Novolog 22 units with meals and Basaglar 48 units at bedtime. Messaged PCP regarding low BG.   Insomnia/anxiety - Continue mirtazapine 15 mg as needed at bedtime  Back Pain - Continue lidocaine patch as needed - Continue gabapentin 300 mg at bedtime   General Health - Continue vitamin B12 1000 mcg daily - Continue vitamin D 50,000 units weekly  Time spent: 15 minutes  Benn Moulder, PharmD Pharmacy Resident  03/24/2021 11:29 AM    Current Outpatient Medications:  .  atorvastatin (LIPITOR) 80 MG tablet, TAKE ONE TABLET BY MOUTH EVERY DAY, Disp: 90 tablet, Rfl: 1 .  blood glucose meter kit and supplies KIT, Dispense based on patient and insurance preference. Use up to four times daily as directed. (FOR ICD-9 250.00, 250.01)., Disp: 1 each, Rfl: 0 .  COMFORT EZ PEN NEEDLES 32G X 4 MM MISC, AS DIRECTED, Disp: 100 each, Rfl: 99 .  furosemide (LASIX) 20 MG tablet, TAKE ONE TABLET BY MOUTH  EVERY DAY, Disp: 30 tablet, Rfl: 3 .  gabapentin (NEURONTIN) 300 MG capsule, TAKE ONE CAPSULE BY MOUTH AT BEDTIME, Disp: 30 capsule, Rfl: 2 .  insulin aspart (NOVOLOG) 100 UNIT/ML FlexPen, INJECT 22 UNITS INTO THE SKIN 3 TIMES A DAY WITH MEALS, Disp: 15 mL, Rfl: 3 .  Insulin Glargine (BASAGLAR KWIKPEN) 100 UNIT/ML, INJECT 48 UNITS INTO THE SKIN AT BEDTIME (REPLACES LANTUS), Disp: 5 mL, Rfl: 11 .  Insulin Pen Needle (PEN NEEDLES) 31G X 5 MM MISC, 15 Units by Does not  apply route 2 (two) times daily., Disp: 100 each, Rfl: 6 .  Insulin Pen Needle 32G X 4 MM MISC, USE WITH INSULIN 2 TIMES A DAY, Disp: 100 each, Rfl: 6 .  lidocaine (LIDODERM) 5 %, APPLY PATCH ONTO THE SKIN AS DIRECTED BY MD. LEAVE ON FOR UP TO 12 HOURS ONLY, THEN REMOVE AND DISCARD PATCH., Disp: 30 patch, Rfl: 1 .  metoprolol succinate (TOPROL-XL) 100 MG 24 hr tablet, TAKE ONE TABLET BY MOUTH. TAKE WITH OR IMMEDIATELY FOLLOWING A MEAL, Disp: 30 tablet, Rfl: 5 .  mirtazapine (REMERON) 15 MG tablet, TAKE ONE TABLET BY MOUTH ONCE DAILY AT BEDTIME FOR INSOMNIA OR ANXIETY., Disp: 30 tablet, Rfl: 1 .  RIGHTEST [id number] BLOOD GLUCOSE test strip, AS DIRECTED, Disp: 100 strip, Rfl: 99 .  sacubitril-valsartan (ENTRESTO) 97-103 MG, TAKE ONE TABLET BY MOUTH 2 TIMES A DAY, Disp: 60 tablet, Rfl: 5 .  spironolactone (ALDACTONE) 25 MG tablet, TAKE ONE TABLET BY MOUTH EVERY DAY, Disp: 90 tablet, Rfl: 1 .  vitamin B-12 (CYANOCOBALAMIN) 1000 MCG tablet, Take 1 tablet (1,000 mcg total) by mouth daily. (Patient not taking: Reported on 03/17/2021), Disp: 90 tablet, Rfl: 1 .  Vitamin D, Ergocalciferol, (DRISDOL) 1.25 MG (50000 UNIT) CAPS capsule, TAKE ONE CAPSULE BY MOUTH ONCE EVERY WEEK ON THE SAME DAY, Disp: 5 capsule, Rfl: 1   COUNSELING POINTS/CLINICAL PEARLS Metoprolol Succinate (Goal: 200 mg once daily) Warn patient to avoid activities requiring mental alertness or coordination until drug effects are realized, as drug may cause dizziness. Tell patient planning major surgery with anesthesia to alert physician that drug is being used, as drug impairs ability of heart to respond to reflex adrenergic stimuli. Drug may cause diarrhea, fatigue, headache, or depression. Advise diabetic patient to carefully monitor blood glucose as drug may mask symptoms of hypoglycemia. Patient should take extended-release tablet with or immediately following meals. Counsel patient against sudden discontinuation of drug, as this may  precipitate hypertension, angina, or myocardial infarction. In the event of a missed dose, counsel patient to skip the missed dose and maintain a regular dosing schedule. Entresto (Goal: 97/103 mg twice daily)  Warn male patient to avoid pregnancy during therapy and to report a pregnancy to a physician.  Advise patient to report symptomatic hypotension.  Side effects may include hyperkalemia, cough, dizziness, or renal failure. Furosemide  Drug causes sun-sensitivity. Advise patient to use sunscreen and avoid tanning beds. Patient should avoid activities requiring coordination until drug effects are realized, as drug may cause dizziness, vertigo, or blurred vision. This drug may cause hyperglycemia, hyperuricemia, constipation, diarrhea, loss of appetite, nausea, vomiting, purpuric disorder, cramps, spasticity, asthenia, headache, paresthesia, or scaling eczema. Instruct patient to report unusual bleeding/bruising or signs/symptoms of hypotension, infection, pancreatitis, or ototoxicity (tinnitus, hearing impairment). Advise patient to report signs/symptoms of a severe skin reactions (flu-like symptoms, spreading red rash, or skin/mucous membrane blistering) or erythema multiforme. Instruct patient to eat high-potassium foods during drug therapy, as directed by healthcare  professional.  Patient should not drink alcohol while taking this drug. Spironolactone  Warn patient to report dehydration, hypotension, or symptoms of worsening renal function.  Counsel male patient to report gynecomastia.  Side effects may include diarrhea, nausea, vomiting, abdominal cramping, fever, leg cramps, lethargy, mental confusion, decreased libido, irregular menses, and rash. Suspension: Tell patient to take drug consistently with respect to food, either before or after a meal.  Advise patient to avoid potassium supplements and foods containing high levels of potassium, including salt substitutes.  DRUGS TO AVOID  IN HEART FAILURE  Drug or Class Mechanism  Analgesics . NSAIDs . COX-2 inhibitors . Glucocorticoids  Sodium and water retention, increased systemic vascular resistance, decreased response to diuretics   Diabetes Medications . Metformin . Thiazolidinediones o Rosiglitazone (Avandia) o Pioglitazone (Actos) . DPP4 Inhibitors o Saxagliptin (Onglyza) o Sitagliptin (Januvia)   Lactic acidosis Possible calcium channel blockade   Unknown  Antiarrhythmics . Class I  o Flecainide o Disopyramide . Class III o Sotalol . Other o Dronedarone  Negative inotrope, proarrhythmic   Proarrhythmic, beta blockade  Negative inotrope  Antihypertensives . Alpha Blockers o Doxazosin . Calcium Channel Blockers o Diltiazem o Verapamil o Nifedipine . Central Alpha Adrenergics o Moxonidine . Peripheral Vasodilators o Minoxidil  Increases renin and aldosterone  Negative inotrope    Possible sympathetic withdrawal  Unknown  Anti-infective . Itraconazole . Amphotericin B  Negative inotrope Unknown  Hematologic . Anagrelide . Cilostazol   Possible inhibition of PD IV Inhibition of PD III causing arrhythmias  Neurologic/Psychiatric . Stimulants . Anti-Seizure Drugs o Carbamazepine o Pregabalin . Antidepressants o Tricyclics o Citalopram . Parkinsons o Bromocriptine o Pergolide o Pramipexole . Antipsychotics o Clozapine . Antimigraine o Ergotamine o Methysergide . Appetite suppressants . Bipolar o Lithium  Peripheral alpha and beta agonist activity  Negative inotrope and chronotrope Calcium channel blockade  Negative inotrope, proarrhythmic Dose-dependent QT prolongation  Excessive serotonin activity/valvular damage Excessive serotonin activity/valvular damage Unknown  IgE mediated hypersensitivy, calcium channel blockade  Excessive serotonin activity/valvular damage Excessive serotonin activity/valvular damage Valvular damage  Direct myofibrillar  degeneration, adrenergic stimulation  Antimalarials . Chloroquine . Hydroxychloroquine Intracellular inhibition of lysosomal enzymes  Urologic Agents . Alpha Blockers o Doxazosin o Prazosin o Tamsulosin o Terazosin  Increased renin and aldosterone  Adapted from Page RL, et al. "Drugs That May Cause or Exacerbate Heart Failure: A Scientific Statement from the [place]." Circulation 2016; 009:Q33-A07. DOI: 10.1161/CIR.0000000000000426   MEDICATION ADHERENCES TIPS AND STRATEGIES 1. Taking medication as prescribed improves patient outcomes in heart failure (reduces hospitalizations, improves symptoms, increases survival) 2. Side effects of medications can be managed by decreasing doses, switching agents, stopping drugs, or adding additional therapy. Please let someone in the [facility] know if you have having bothersome side effects so we can modify your regimen. Do not alter your medication regimen without talking to [place].  3. Medication reminders can help patients remember to take drugs on time. If you are missing or forgetting doses you can try linking behaviors, using pill boxes, or an electronic reminder like an alarm on your phone or an app. Some people can also get automated phone calls as medication reminders.

## 2021-03-24 NOTE — Progress Notes (Signed)
Patient ID: Travis Lucas, male    DOB: 11/26/1971, 50 y.o.   MRN: [id number]  HPI  Travis Lucas is a 50 y/o male with a history of DM, HTN, BOOP, hyperlipidemia, current tobacco use and chronic heart failure.   Echo report from 01/25/21 reviewed and showed an EF of 20-25% with mild Travis. Echo report from 10/27/20 reviewed and showed an EF of <20% along with moderate Travis and moderately elevated PA pressure.   Admitted 01/24/21 due to hyperglycemia >900 and presumed pancreatitis. Given IVF and meds adjusted. Spironolactone, metoprolol and entresto held due to AKI/ hypotension. Insulin adjusted & glipizide stopped.    Discharged after 6 days. Admitted 10/26/20 due to acute on chronic HF. Cardiology consult obtained. Initially given IV lasix with resultant loss of 10L with transition to oral diuretics. Discharged after 7 days.   He presents today for a follow-up visit with a chief complaint of minimal shortness of breath upon moderate exertion. He describes this as chronic in nature having been present for several years although he does feel like it has improved. He has associated fatigue, light-headedness, anxiety and difficulty sleeping along with this. He denies any abdominal distention, palpitations, pedal edema, chest pain, cough or weight gain.   Does report feeling a little light-headed and jittery while in the office. Glucose at home ~ 5 hours ago was 400 and he has eaten grits with butter on them along with grapefruit juice. Has had issues with his glucose levels being quite high.   Past Medical History:  Diagnosis Date  . BOOP (bronchiolitis obliterans with organizing pneumonia) ([place])   . CHF (congestive heart failure) ([place])   . Chronic back pain   . Diabetes mellitus without complication ([place])   . Hyperlipidemia   . Hypertension   . Ischemic cardiomyopathy with implantable cardioverter-defibrillator (ICD)    Past Surgical History:  Procedure Laterality Date  . CARDIAC DEFIBRILLATOR PLACEMENT   09/10/2019  . CHOLECYSTECTOMY     No family history on file. Social History   Tobacco Use  . Smoking status: Current Some Day Smoker    Types: Cigarettes  . Smokeless tobacco: Never Used  . Tobacco comment: 1 cigarrette a day   Substance Use Topics  . Alcohol use: Yes    Comment: "socially"    Allergies  Allergen Reactions  . Penicillins    Prior to Admission medications   Medication Sig Start Date End Date Taking? Authorizing Provider  atorvastatin (LIPITOR) 80 MG tablet TAKE ONE TABLET BY MOUTH EVERY DAY 03/17/21 07/08/21 Yes Iloabachie, Chioma E, NP  blood glucose meter kit and supplies KIT Dispense based on patient and insurance preference. Use up to four times daily as directed. (FOR ICD-9 250.00, 250.01). 01/06/21  Yes Tukov-Yual, Magdalene S, NP  COMFORT EZ PEN NEEDLES 32G X 4 MM MISC AS DIRECTED 02/23/21 02/23/22 Yes   furosemide (LASIX) 20 MG tablet TAKE ONE TABLET BY MOUTH EVERY DAY 02/19/21 02/19/22 Yes Agbor-Etang, Aaron Edelman, MD  gabapentin (NEURONTIN) 300 MG capsule TAKE ONE CAPSULE BY MOUTH AT BEDTIME 03/17/21 03/17/22 Yes Iloabachie, Chioma E, NP  insulin aspart (NOVOLOG) 100 UNIT/ML FlexPen INJECT 22 UNITS INTO THE SKIN 3 TIMES A DAY WITH MEALS 02/24/21 02/24/22 Yes Iloabachie, Chioma E, NP  Insulin Glargine (BASAGLAR KWIKPEN) 100 UNIT/ML INJECT 48 UNITS INTO THE SKIN AT BEDTIME (REPLACES LANTUS) 03/01/21 03/01/22 Yes Iloabachie, Chioma E, NP  Insulin Pen Needle (PEN NEEDLES) 31G X 5 MM MISC 15 Units by Does not apply route 2 (two) times  daily. 11/02/20  Yes Lorella Nimrod, MD  Insulin Pen Needle 32G X 4 MM MISC USE WITH INSULIN 2 TIMES A DAY 11/02/20 11/02/21 Yes Lorella Nimrod, MD  lidocaine (LIDODERM) 5 % APPLY PATCH ONTO THE SKIN AS DIRECTED BY MD. LEAVE ON FOR UP TO 12 HOURS ONLY, THEN REMOVE AND DISCARD PATCH. 02/10/21 10/27/21 Yes Iloabachie, Chioma E, NP  metoprolol succinate (TOPROL-XL) 100 MG 24 hr tablet TAKE ONE TABLET BY MOUTH. TAKE WITH OR IMMEDIATELY FOLLOWING A MEAL 12/11/20  12/11/21 Yes Agbor-Etang, Aaron Edelman, MD  mirtazapine (REMERON) 15 MG tablet TAKE ONE TABLET BY MOUTH ONCE DAILY AT BEDTIME FOR INSOMNIA OR ANXIETY. 03/22/21  Yes   RIGHTEST GS550 BLOOD GLUCOSE test strip AS DIRECTED 02/23/21 02/23/22 Yes   sacubitril-valsartan (ENTRESTO) 97-103 MG TAKE ONE TABLET BY MOUTH 2 TIMES A DAY 02/19/21 02/19/22 Yes Agbor-Etang, Aaron Edelman, MD  spironolactone (ALDACTONE) 25 MG tablet TAKE ONE TABLET BY MOUTH EVERY DAY 11/02/20 07/28/21 Yes Lorella Nimrod, MD  vitamin B-12 (CYANOCOBALAMIN) 1000 MCG tablet Take 1 tablet (1,000 mcg total) by mouth daily. 01/06/21  Yes Tukov-Yual, Magdalene S, NP  Vitamin D, Ergocalciferol, (DRISDOL) 1.25 MG (50000 UNIT) CAPS capsule TAKE ONE CAPSULE BY MOUTH ONCE EVERY WEEK ON THE SAME DAY 01/06/21 01/06/22 Yes Tukov-Yual, Arlyss Gandy, NP     Review of Systems  Constitutional: Positive for fatigue. Negative for appetite change.  HENT: Negative for congestion, rhinorrhea and sore throat.   Eyes: Negative.   Respiratory: Positive for shortness of breath ("improving"). Negative for cough.   Cardiovascular: Negative for chest pain, palpitations and leg swelling.  Gastrointestinal: Negative for abdominal distention and abdominal pain.  Endocrine: Negative.   Genitourinary: Negative.   Musculoskeletal: Negative for back pain and neck pain.  Skin: Negative.   Allergic/Immunologic: Negative.   Neurological: Positive for light-headedness (at times). Negative for dizziness.  Hematological: Negative for adenopathy. Does not bruise/bleed easily.  Psychiatric/Behavioral: Positive for sleep disturbance. Negative for dysphoric mood. The patient is nervous/anxious (at times).    Vitals:   03/24/21 1047  BP: 106/69  Pulse: 85  Resp: 18  SpO2: 100%  Weight: 175 lb (79.4 kg)  Height: 6' 2"  (1.88 m)   Wt Readings from Last 3 Encounters:  03/24/21 175 lb (79.4 kg)  03/17/21 188 lb 1.6 oz (85.3 kg)  02/24/21 185 lb 3.2 oz (84 kg)   Lab Results  Component Value Date    CREATININE 0.82 01/06/2021   CREATININE 0.93 11/10/2020   CREATININE 0.91 11/02/2020    Physical Exam Vitals and nursing note reviewed.  Constitutional:      Appearance: He is well-developed.  HENT:     Head: Normocephalic and atraumatic.  Neck:     Vascular: No JVD.  Cardiovascular:     Rate and Rhythm: Normal rate and regular rhythm.  Pulmonary:     Effort: Pulmonary effort is normal. No respiratory distress.     Breath sounds: No wheezing or rales.  Abdominal:     Palpations: Abdomen is soft.     Tenderness: There is no abdominal tenderness.  Musculoskeletal:     Cervical back: Neck supple.     Right lower leg: No tenderness. No edema.     Left lower leg: No tenderness. No edema.  Skin:    General: Skin is warm and dry.  Neurological:     General: No focal deficit present.     Mental Status: He is alert and oriented to person, place, and time.  Psychiatric:  Mood and Affect: Mood normal.        Behavior: Behavior normal.    Assessment & Plan:  1: Chronic heart failure with reduced ejection fraction- - NYHA class II - euvolemic today - weighing daily; reminded to call for an overnight weight gain of > 2 pounds or a weekly weight gain of >5 pounds - weight up 13 pounds from last visit here 5 months ago - not adding salt and has tried to follow a low sodium diet - saw cardiology ([facility]) 02/19/21; has echo scheduled for 07/22/21 and office f/u on 07/26/21 - on GDMT of metoprolol succinate, entresto and spironolactone - consider adding SGLT - has AICD (placed Oct 2020) - BNP 01/24/21 was 11.05 - PharmD reconciled medications with the patient  2: HTN- - BP looks good today although on the low side (106/69) - saw PCP (Iloabachie) at [facility] on 03/17/21; returns 04/14/21 - CMP 01/30/21 reviewed and showed sodium 134, potassium 4.4, creatinine 0.82 and GFR >90  3: DM- - A1c 01/24/21 was 13.4% - goes to endocrine clinic on 04/13/21 - reports his  glucose this morning at home was 400, ate breakfast of grits and grapefruit juice and 5 hours later in the office it was 89. Juice and crackers given to him and he says that he feels better - PharmD messaged PCP to advise of his wide fluctuations of glucose  4: Insomnia- - says that he just picked up remeron and will be starting this - following with psychiatry at [facility]   Medication bottles reviewed.   Return here in 3 months or sooner for any questions/problems before then.

## 2021-03-24 NOTE — Patient Instructions (Signed)
Continue weighing daily and call for an overnight weight gain of > 2 pounds or a weekly weight gain of >5 pounds. 

## 2021-03-26 NOTE — Telephone Encounter (Signed)
Patient not available.  Lm to call office .

## 2021-03-29 ENCOUNTER — Other Ambulatory Visit: Payer: Self-pay

## 2021-03-30 ENCOUNTER — Encounter: Payer: Self-pay | Admitting: *Deleted

## 2021-03-30 ENCOUNTER — Telehealth: Payer: Self-pay

## 2021-03-30 NOTE — Telephone Encounter (Signed)
The patient states he do have a cell phone from [employer] to monitor his ICD.

## 2021-04-01 ENCOUNTER — Ambulatory Visit: Payer: Self-pay

## 2021-04-01 ENCOUNTER — Other Ambulatory Visit: Payer: Self-pay

## 2021-04-06 ENCOUNTER — Emergency Department: Payer: Self-pay

## 2021-04-06 ENCOUNTER — Encounter: Payer: Self-pay | Admitting: Emergency Medicine

## 2021-04-06 ENCOUNTER — Other Ambulatory Visit: Payer: Self-pay

## 2021-04-06 ENCOUNTER — Inpatient Hospital Stay
Admission: EM | Admit: 2021-04-06 | Discharge: 2021-04-09 | DRG: 638 | Disposition: A | Discharge status: Home / Self Care | Payer: Self-pay | Attending: Internal Medicine | Admitting: Internal Medicine

## 2021-04-06 DIAGNOSIS — F17200 Nicotine dependence, unspecified, uncomplicated: Secondary | ICD-10-CM | POA: Diagnosis present

## 2021-04-06 DIAGNOSIS — E1100 Type 2 diabetes mellitus with hyperosmolarity without nonketotic hyperglycemic-hyperosmolar coma (NKHHC): Secondary | ICD-10-CM

## 2021-04-06 DIAGNOSIS — Z95810 Presence of automatic (implantable) cardiac defibrillator: Secondary | ICD-10-CM

## 2021-04-06 DIAGNOSIS — Z6821 Body mass index (BMI) 21.0-21.9, adult: Secondary | ICD-10-CM

## 2021-04-06 DIAGNOSIS — E872 Acidosis: Secondary | ICD-10-CM | POA: Diagnosis present

## 2021-04-06 DIAGNOSIS — E871 Hypo-osmolality and hyponatremia: Secondary | ICD-10-CM | POA: Diagnosis present

## 2021-04-06 DIAGNOSIS — Z9049 Acquired absence of other specified parts of digestive tract: Secondary | ICD-10-CM

## 2021-04-06 DIAGNOSIS — I5022 Chronic systolic (congestive) heart failure: Secondary | ICD-10-CM | POA: Diagnosis present

## 2021-04-06 DIAGNOSIS — I10 Essential (primary) hypertension: Secondary | ICD-10-CM | POA: Diagnosis present

## 2021-04-06 DIAGNOSIS — Z794 Long term (current) use of insulin: Secondary | ICD-10-CM

## 2021-04-06 DIAGNOSIS — E785 Hyperlipidemia, unspecified: Secondary | ICD-10-CM | POA: Diagnosis present

## 2021-04-06 DIAGNOSIS — R296 Repeated falls: Secondary | ICD-10-CM | POA: Diagnosis present

## 2021-04-06 DIAGNOSIS — Z79899 Other long term (current) drug therapy: Secondary | ICD-10-CM

## 2021-04-06 DIAGNOSIS — M25551 Pain in right hip: Secondary | ICD-10-CM | POA: Diagnosis present

## 2021-04-06 DIAGNOSIS — I110 Hypertensive heart disease with heart failure: Secondary | ICD-10-CM | POA: Diagnosis present

## 2021-04-06 DIAGNOSIS — E870 Hyperosmolality and hypernatremia: Secondary | ICD-10-CM

## 2021-04-06 DIAGNOSIS — G629 Polyneuropathy, unspecified: Secondary | ICD-10-CM | POA: Diagnosis present

## 2021-04-06 DIAGNOSIS — E1065 Type 1 diabetes mellitus with hyperglycemia: Principal | ICD-10-CM | POA: Diagnosis present

## 2021-04-06 DIAGNOSIS — K529 Noninfective gastroenteritis and colitis, unspecified: Secondary | ICD-10-CM | POA: Diagnosis present

## 2021-04-06 DIAGNOSIS — I255 Ischemic cardiomyopathy: Secondary | ICD-10-CM | POA: Diagnosis present

## 2021-04-06 DIAGNOSIS — N179 Acute kidney failure, unspecified: Secondary | ICD-10-CM | POA: Diagnosis present

## 2021-04-06 DIAGNOSIS — Z7984 Long term (current) use of oral hypoglycemic drugs: Secondary | ICD-10-CM

## 2021-04-06 DIAGNOSIS — G8929 Other chronic pain: Secondary | ICD-10-CM | POA: Diagnosis present

## 2021-04-06 DIAGNOSIS — E860 Dehydration: Secondary | ICD-10-CM | POA: Diagnosis present

## 2021-04-06 DIAGNOSIS — I428 Other cardiomyopathies: Secondary | ICD-10-CM | POA: Diagnosis present

## 2021-04-06 DIAGNOSIS — Z23 Encounter for immunization: Secondary | ICD-10-CM

## 2021-04-06 DIAGNOSIS — E1165 Type 2 diabetes mellitus with hyperglycemia: Secondary | ICD-10-CM

## 2021-04-06 DIAGNOSIS — Z87438 Personal history of other diseases of male genital organs: Secondary | ICD-10-CM | POA: Diagnosis present

## 2021-04-06 DIAGNOSIS — Z880 Allergy status to penicillin: Secondary | ICD-10-CM

## 2021-04-06 DIAGNOSIS — R579 Shock, unspecified: Secondary | ICD-10-CM | POA: Diagnosis present

## 2021-04-06 DIAGNOSIS — Z20822 Contact with and (suspected) exposure to covid-19: Secondary | ICD-10-CM | POA: Diagnosis present

## 2021-04-06 DIAGNOSIS — R630 Anorexia: Secondary | ICD-10-CM | POA: Diagnosis present

## 2021-04-06 DIAGNOSIS — F17210 Nicotine dependence, cigarettes, uncomplicated: Secondary | ICD-10-CM | POA: Diagnosis present

## 2021-04-06 LAB — GLUCOSE, CAPILLARY
Glucose-Capillary: >600 mg/dL (ref 70–99)
Glucose-Capillary: >600 mg/dL (ref 70–99)
Glucose-Capillary: 538 mg/dL (ref 70–99)
Glucose-Capillary: 395 mg/dL — ABNORMAL HIGH (ref 70–99)
Glucose-Capillary: 408 mg/dL — ABNORMAL HIGH (ref 70–99)
Glucose-Capillary: 287 mg/dL — ABNORMAL HIGH (ref 70–99)
Glucose-Capillary: 200 mg/dL — ABNORMAL HIGH (ref 70–99)
Glucose-Capillary: 192 mg/dL — ABNORMAL HIGH (ref 70–99)
Glucose-Capillary: 230 mg/dL — ABNORMAL HIGH (ref 70–99)
Glucose-Capillary: 233 mg/dL — ABNORMAL HIGH (ref 70–99)

## 2021-04-06 LAB — CBC WITH DIFFERENTIAL/PLATELET
Abs Immature Granulocytes: 0.05 10*3/uL (ref 0.00–0.07)
Basophils Absolute: 0 10*3/uL (ref 0.0–0.1)
Basophils Relative: 0 %
Eosinophils Absolute: 0 10*3/uL (ref 0.0–0.5)
Eosinophils Relative: 0 %
HCT: 43.1 % (ref 39.0–52.0)
Hemoglobin: 14.3 g/dL (ref 13.0–17.0)
Immature Granulocytes: 1 %
Lymphocytes Relative: 9 %
Lymphs Abs: 0.9 10*3/uL (ref 0.7–4.0)
MCH: 29.6 pg (ref 26.0–34.0)
MCHC: 33.2 g/dL (ref 30.0–36.0)
MCV: 89.2 fL (ref 80.0–100.0)
Monocytes Absolute: 0.4 10*3/uL (ref 0.1–1.0)
Monocytes Relative: 4 %
Neutro Abs: 8.8 10*3/uL — ABNORMAL HIGH (ref 1.7–7.7)
Neutrophils Relative %: 86 %
Platelets: 240 10*3/uL (ref 150–400)
RBC: 4.83 MIL/uL (ref 4.22–5.81)
RDW: 12.8 % (ref 11.5–15.5)
WBC: 10.2 10*3/uL (ref 4.0–10.5)
nRBC: 0 % (ref 0.0–0.2)

## 2021-04-06 LAB — RESP PANEL BY RT-PCR (FLU A&B, COVID) ARPGX2
Influenza A by PCR: NEGATIVE
Influenza B by PCR: NEGATIVE
SARS Coronavirus 2 by RT PCR: NEGATIVE

## 2021-04-06 LAB — TROPONIN I (HIGH SENSITIVITY)
Troponin I (High Sensitivity): 36 ng/L — ABNORMAL HIGH (ref <18)
Troponin I (High Sensitivity): 40 ng/L — ABNORMAL HIGH (ref <18)

## 2021-04-06 LAB — COMPREHENSIVE METABOLIC PANEL
ALT: 39 U/L (ref 0–44)
AST: 34 U/L (ref 15–41)
Albumin: 5.1 g/dL — ABNORMAL HIGH (ref 3.5–5.0)
Alkaline Phosphatase: 96 U/L (ref 38–126)
Anion gap: 25 — ABNORMAL HIGH (ref 5–15)
BUN: 55 mg/dL — ABNORMAL HIGH (ref 6–20)
CO2: 21 mmol/L — ABNORMAL LOW (ref 22–32)
Calcium: 9.8 mg/dL (ref 8.9–10.3)
Chloride: 79 mmol/L — ABNORMAL LOW (ref 98–111)
Creatinine, Ser: 5.75 mg/dL — ABNORMAL HIGH (ref 0.61–1.24)
GFR, Estimated: 11 mL/min — ABNORMAL LOW (ref >60)
Glucose, Bld: 834 mg/dL (ref 70–99)
Potassium: 4.8 mmol/L (ref 3.5–5.1)
Sodium: 125 mmol/L — ABNORMAL LOW (ref 135–145)
Total Bilirubin: 0.9 mg/dL (ref 0.3–1.2)
Total Protein: 9.4 g/dL — ABNORMAL HIGH (ref 6.5–8.1)

## 2021-04-06 LAB — BLOOD GAS, VENOUS
Acid-base deficit: 0.1 mmol/L (ref 0.0–2.0)
Bicarbonate: 26.4 mmol/L (ref 20.0–28.0)
O2 Saturation: 43.2 %
Patient temperature: 37
pCO2, Ven: 49 mmHg (ref 44.0–60.0)
pH, Ven: 7.34 (ref 7.250–7.430)
pO2, Ven: <31 mmHg — CL (ref 32.0–45.0)

## 2021-04-06 LAB — BASIC METABOLIC PANEL
Anion gap: 20 — ABNORMAL HIGH (ref 5–15)
Anion gap: 17 — ABNORMAL HIGH (ref 5–15)
BUN: 58 mg/dL — ABNORMAL HIGH (ref 6–20)
BUN: 61 mg/dL — ABNORMAL HIGH (ref 6–20)
CO2: 25 mmol/L (ref 22–32)
CO2: 27 mmol/L (ref 22–32)
Calcium: 9.5 mg/dL (ref 8.9–10.3)
Calcium: 9.7 mg/dL (ref 8.9–10.3)
Chloride: 83 mmol/L — ABNORMAL LOW (ref 98–111)
Chloride: 90 mmol/L — ABNORMAL LOW (ref 98–111)
Creatinine, Ser: 5.13 mg/dL — ABNORMAL HIGH (ref 0.61–1.24)
Creatinine, Ser: 4.52 mg/dL — ABNORMAL HIGH (ref 0.61–1.24)
GFR, Estimated: 13 mL/min — ABNORMAL LOW (ref >60)
GFR, Estimated: 15 mL/min — ABNORMAL LOW (ref >60)
Glucose, Bld: 662 mg/dL (ref 70–99)
Glucose, Bld: 200 mg/dL — ABNORMAL HIGH (ref 70–99)
Potassium: 3.9 mmol/L (ref 3.5–5.1)
Potassium: 3.3 mmol/L — ABNORMAL LOW (ref 3.5–5.1)
Sodium: 128 mmol/L — ABNORMAL LOW (ref 135–145)
Sodium: 134 mmol/L — ABNORMAL LOW (ref 135–145)

## 2021-04-06 LAB — BRAIN NATRIURETIC PEPTIDE: B Natriuretic Peptide: 11 pg/mL (ref 0.0–100.0)

## 2021-04-06 LAB — PROTIME-INR
INR: 1.1 (ref 0.8–1.2)
Prothrombin Time: 13.7 seconds (ref 11.4–15.2)

## 2021-04-06 LAB — HEMOGLOBIN A1C
Hgb A1c MFr Bld: 10.4 % — ABNORMAL HIGH (ref 4.8–5.6)
Mean Plasma Glucose: 251.78 mg/dL

## 2021-04-06 LAB — MAGNESIUM: Magnesium: 2.3 mg/dL (ref 1.7–2.4)

## 2021-04-06 LAB — LACTIC ACID, PLASMA
Lactic Acid, Venous: 6 mmol/L (ref 0.5–1.9)
Lactic Acid, Venous: 3.7 mmol/L (ref 0.5–1.9)

## 2021-04-06 LAB — CK: Total CK: 225 U/L (ref 49–397)

## 2021-04-06 LAB — BETA-HYDROXYBUTYRIC ACID: Beta-Hydroxybutyric Acid: 0.14 mmol/L (ref 0.05–0.27)

## 2021-04-06 LAB — APTT: aPTT: 28 seconds (ref 24–36)

## 2021-04-06 LAB — MRSA PCR SCREENING: MRSA by PCR: NEGATIVE

## 2021-04-06 LAB — LIPASE, BLOOD: Lipase: 31 U/L (ref 11–51)

## 2021-04-06 LAB — OSMOLALITY: Osmolality: 331 mOsm/kg (ref 275–295)

## 2021-04-06 MED ORDER — FAMOTIDINE 20 MG PO TABS
20.0000 mg | ORAL_TABLET | Freq: Every day | ORAL | Status: DC
  Administered 2021-04-06 – 2021-04-08 (×3): 20 mg via ORAL
  Filled 2021-04-06 (×3): qty 1

## 2021-04-06 MED ORDER — VITAMIN B-12 1000 MCG PO TABS
1000.0000 ug | ORAL_TABLET | Freq: Every day | ORAL | Status: DC
  Administered 2021-04-07 – 2021-04-09 (×3): 1000 ug via ORAL
  Filled 2021-04-06 (×3): qty 1

## 2021-04-06 MED ORDER — ONDANSETRON HCL 4 MG/2ML IJ SOLN
4.0000 mg | Freq: Once | INTRAMUSCULAR | Status: AC
  Administered 2021-04-06: 4 mg via INTRAVENOUS
  Filled 2021-04-06: qty 2

## 2021-04-06 MED ORDER — DEXTROSE IN LACTATED RINGERS 5 % IV SOLN: INTRAVENOUS | Status: DC

## 2021-04-06 MED ORDER — METOPROLOL SUCCINATE ER 50 MG PO TB24
100.0000 mg | ORAL_TABLET | Freq: Every day | ORAL | Status: DC
  Administered 2021-04-07 – 2021-04-09 (×3): 100 mg via ORAL
  Filled 2021-04-06 (×3): qty 2

## 2021-04-06 MED ORDER — SACUBITRIL-VALSARTAN 97-103 MG PO TABS: 1.0000 | ORAL_TABLET | Freq: Two times a day (BID) | ORAL | Status: DC

## 2021-04-06 MED ORDER — LACTATED RINGERS IV SOLN: INTRAVENOUS | Status: DC

## 2021-04-06 MED ORDER — ATORVASTATIN CALCIUM 20 MG PO TABS
80.0000 mg | ORAL_TABLET | Freq: Every day | ORAL | Status: DC
  Administered 2021-04-06 – 2021-04-08 (×3): 80 mg via ORAL
  Filled 2021-04-06 (×3): qty 4

## 2021-04-06 MED ORDER — POTASSIUM CHLORIDE CRYS ER 20 MEQ PO TBCR
40.0000 meq | EXTENDED_RELEASE_TABLET | Freq: Once | ORAL | Status: AC
  Administered 2021-04-06: 40 meq via ORAL
  Filled 2021-04-06: qty 2

## 2021-04-06 MED ORDER — FUROSEMIDE 20 MG PO TABS
20.0000 mg | ORAL_TABLET | Freq: Every day | ORAL | Status: DC
  Administered 2021-04-07: 20 mg via ORAL

## 2021-04-06 MED ORDER — GABAPENTIN 300 MG PO CAPS
300.0000 mg | ORAL_CAPSULE | Freq: Every day | ORAL | Status: DC
  Administered 2021-04-06 – 2021-04-08 (×3): 300 mg via ORAL
  Filled 2021-04-06 (×3): qty 1

## 2021-04-06 MED ORDER — INSULIN REGULAR(HUMAN) IN NACL 100-0.9 UT/100ML-% IV SOLN
INTRAVENOUS | Status: DC
  Administered 2021-04-06: 9.5 [IU]/h via INTRAVENOUS

## 2021-04-06 MED ORDER — POTASSIUM CHLORIDE 10 MEQ/100ML IV SOLN
10.0000 meq | INTRAVENOUS | Status: AC
  Administered 2021-04-06 (×2): 10 meq via INTRAVENOUS
  Filled 2021-04-06 (×2): qty 100

## 2021-04-06 MED ORDER — LACTATED RINGERS IV BOLUS
1000.0000 mL | Freq: Once | INTRAVENOUS | Status: AC
  Administered 2021-04-06: 1000 mL via INTRAVENOUS

## 2021-04-06 MED ORDER — CHLORHEXIDINE GLUCONATE CLOTH 2 % EX PADS
6.0000 | MEDICATED_PAD | Freq: Every day | CUTANEOUS | Status: DC
  Administered 2021-04-06 – 2021-04-07 (×2): 6 via TOPICAL

## 2021-04-06 MED ORDER — MIRTAZAPINE 15 MG PO TABS
15.0000 mg | ORAL_TABLET | Freq: Every day | ORAL | Status: DC
  Administered 2021-04-06 – 2021-04-08 (×3): 15 mg via ORAL
  Filled 2021-04-06 (×3): qty 1

## 2021-04-06 MED ORDER — DEXTROSE 50 % IV SOLN: 0.0000 mL | INTRAVENOUS | Status: DC | PRN

## 2021-04-06 MED ORDER — PANTOPRAZOLE SODIUM 40 MG PO TBEC
40.0000 mg | DELAYED_RELEASE_TABLET | Freq: Every day | ORAL | Status: DC
  Administered 2021-04-06 – 2021-04-07 (×2): 40 mg via ORAL
  Filled 2021-04-06: qty 1

## 2021-04-06 MED ORDER — POTASSIUM CHLORIDE 10 MEQ/100ML IV SOLN: 10.0000 meq | INTRAVENOUS | Status: DC

## 2021-04-06 MED ORDER — SPIRONOLACTONE 25 MG PO TABS: 25.0000 mg | ORAL_TABLET | Freq: Every day | ORAL | Status: DC

## 2021-04-06 MED ORDER — ENOXAPARIN SODIUM 30 MG/0.3ML IJ SOSY
30.0000 mg | PREFILLED_SYRINGE | INTRAMUSCULAR | Status: DC
  Administered 2021-04-06: 30 mg via SUBCUTANEOUS
  Filled 2021-04-06 (×2): qty 0.3

## 2021-04-06 MED ORDER — ALUM & MAG HYDROXIDE-SIMETH 200-200-20 MG/5ML PO SUSP
30.0000 mL | ORAL | Status: DC | PRN
  Administered 2021-04-06 – 2021-04-08 (×2): 30 mL via ORAL
  Filled 2021-04-06 (×2): qty 30

## 2021-04-06 NOTE — ED Notes (Signed)
Patient at imaging.

## 2021-04-06 NOTE — ED Triage Notes (Signed)
Patient arrives via EMS from home for hyperglycemia and N/V. Patient has had N/V since Sunday and has not taken any home meds including insulin since. Patient is AOx4 at this time.

## 2021-04-06 NOTE — ED Notes (Signed)
Patient unable to sing MSE due to patient condition.

## 2021-04-06 NOTE — ED Provider Notes (Signed)
[facility] Emergency Department Provider Note  ____________________________________________   Event Date/Time   First MD Initiated Contact with Patient 04/06/21 1305     (approximate)  I have reviewed the triage vital signs and the nursing notes.   HISTORY  Chief Complaint Hyperglycemia   HPI Travis Lucas is a 50 y.o. male past medical history of BOOP, CHF, chronic back pain, HTN, HDL, and ischemic cardiomyopathy who presents via EMS from his home he was found on the floor by one of his peer counselors.  He reports that over the last 3 days he has had some nausea and vomiting with overall feeling extremely weak.  He is fallen several times most recently earlier today and was on the floor for several hours.  He thinks he hit his head at least once during 1 of these falls and has some pain in his right hip although is not sure if he has hit his hip or not.  He is not on any blood thinners.         Past Medical History:  Diagnosis Date  . BOOP (bronchiolitis obliterans with organizing pneumonia) ([place])   . CHF (congestive heart failure) ([place])   . Chronic back pain   . Diabetes mellitus without complication ([place])   . Hyperlipidemia   . Hypertension   . Ischemic cardiomyopathy with implantable cardioverter-defibrillator (ICD)     Patient Active Problem List   Diagnosis Date Noted  . Hyperglycemia due to type 1 diabetes mellitus ([place]) 04/06/2021  . History of erectile dysfunction 03/17/2021  . Health care maintenance 02/24/2021  . Ischemic cardiomyopathy with implantable cardioverter-defibrillator (ICD)   . Abnormal laboratory test result 02/08/2021  . Peripheral neuropathy 02/04/2021  . Primary hypertension   . Smoking   . Acute on chronic systolic CHF (congestive heart failure) ([place]) 10/26/2020  . Insulin-requiring or dependent type II diabetes mellitus ([place])   . BOOP (bronchiolitis obliterans with organizing pneumonia) ([place])   . Hypertensive  urgency     Past Surgical History:  Procedure Laterality Date  . CARDIAC DEFIBRILLATOR PLACEMENT  09/10/2019  . CHOLECYSTECTOMY      Prior to Admission medications   Medication Sig Start Date End Date Taking? Authorizing Provider  atorvastatin (LIPITOR) 80 MG tablet TAKE ONE TABLET BY MOUTH EVERY DAY 03/17/21 07/08/21  Iloabachie, Chioma E, NP  blood glucose meter kit and supplies KIT Dispense based on patient and insurance preference. Use up to four times daily as directed. (FOR ICD-9 250.00, 250.01). 01/06/21   Erlene Quan, NP  COMFORT EZ PEN NEEDLES 32G X 4 MM MISC AS DIRECTED 02/23/21 02/23/22    furosemide (LASIX) 20 MG tablet TAKE ONE TABLET BY MOUTH EVERY DAY 02/19/21 02/19/22  Kate Sable, MD  gabapentin (NEURONTIN) 300 MG capsule TAKE ONE CAPSULE BY MOUTH AT BEDTIME 03/17/21 03/17/22  Iloabachie, Chioma E, NP  insulin aspart (NOVOLOG) 100 UNIT/ML FlexPen INJECT 22 UNITS INTO THE SKIN 3 TIMES A DAY WITH MEALS 02/24/21 02/24/22  Iloabachie, Chioma E, NP  Insulin Glargine (BASAGLAR KWIKPEN) 100 UNIT/ML INJECT 48 UNITS INTO THE SKIN AT BEDTIME (REPLACES LANTUS) 03/01/21 03/01/22  Iloabachie, Chioma E, NP  Insulin Pen Needle (PEN NEEDLES) 31G X 5 MM MISC 15 Units by Does not apply route 2 (two) times daily. 11/02/20   Lorella Nimrod, MD  Insulin Pen Needle 32G X 4 MM MISC USE WITH INSULIN 2 TIMES A DAY 11/02/20 11/02/21  Lorella Nimrod, MD  lidocaine (LIDODERM) 5 % APPLY PATCH ONTO THE  SKIN AS DIRECTED BY MD. LEAVE ON FOR UP TO 12 HOURS ONLY, THEN REMOVE AND DISCARD PATCH. 02/10/21 10/27/21  Iloabachie, Chioma E, NP  metoprolol succinate (TOPROL-XL) 100 MG 24 hr tablet TAKE ONE TABLET BY MOUTH. TAKE WITH OR IMMEDIATELY FOLLOWING A MEAL 12/11/20 12/11/21  Kate Sable, MD  mirtazapine (REMERON) 15 MG tablet TAKE ONE TABLET BY MOUTH ONCE DAILY AT BEDTIME FOR INSOMNIA OR ANXIETY. 03/22/21     RIGHTEST GS550 BLOOD GLUCOSE test strip AS DIRECTED 02/23/21 02/23/22    sacubitril-valsartan  (ENTRESTO) 97-103 MG TAKE ONE TABLET BY MOUTH 2 TIMES A DAY 02/19/21 02/19/22  Kate Sable, MD  spironolactone (ALDACTONE) 25 MG tablet TAKE ONE TABLET BY MOUTH EVERY DAY 11/02/20 07/28/21  Lorella Nimrod, MD  vitamin B-12 (CYANOCOBALAMIN) 1000 MCG tablet Take 1 tablet (1,000 mcg total) by mouth daily. 01/06/21   Tukov-Yual, Arlyss Gandy, NP  Vitamin D, Ergocalciferol, (DRISDOL) 1.25 MG (50000 UNIT) CAPS capsule TAKE ONE CAPSULE BY MOUTH ONCE EVERY WEEK ON THE SAME DAY 01/06/21 01/06/22  Tukov-Yual, Arlyss Gandy, NP  glipiZIDE (GLUCOTROL XL) 2.5 MG 24 hr tablet Take one tablet with lunch 01/06/21 02/04/21  Tukov-Yual, Arlyss Gandy, NP  losartan (COZAAR) 50 MG tablet Take 1 tablet (50 mg total) by mouth daily. Patient not taking: Reported on 02/04/2021 01/13/21 02/04/21  Vickie Epley, MD    Allergies Penicillins  No family history on file.  Social History Social History   Tobacco Use  . Smoking status: Current Some Day Smoker    Types: Cigarettes  . Smokeless tobacco: Never Used  . Tobacco comment: 1 cigarrette a day   Vaping Use  . Vaping Use: Never used  Substance Use Topics  . Alcohol use: Yes    Comment: "socially"   . Drug use: Yes    Types: Marijuana    Review of Systems  Review of Systems  Constitutional: Negative for chills and fever.  HENT: Negative for sore throat.   Eyes: Negative for pain.  Respiratory: Negative for cough and stridor.   Cardiovascular: Negative for chest pain.  Gastrointestinal: Positive for nausea and vomiting.  Genitourinary: Negative for dysuria.  Musculoskeletal: Positive for back pain and joint pain ( R hip). Negative for myalgias.  Skin: Negative for rash.  Neurological: Negative for seizures, loss of consciousness and headaches.  Psychiatric/Behavioral: Negative for suicidal ideas.  All other systems reviewed and are negative.     ____________________________________________   PHYSICAL EXAM:  VITAL SIGNS: ED Triage Vitals  Enc Vitals  Group     BP      Pulse      Resp      Temp      Temp src      SpO2      Weight      Height      Head Circumference      Peak Flow      Pain Score      Pain Loc      Pain Edu?      Excl. in [place]?    Vitals:   04/06/21 1500 04/06/21 1530  BP: (!) 146/102 (!) 163/96  Pulse: (!) 125 (!) 125  Resp: 16 18  Temp:    SpO2: 100% 100%   Physical Exam Vitals and nursing note reviewed.  Constitutional:      General: He is in acute distress.     Appearance: He is well-developed. He is ill-appearing.  HENT:     Head: Normocephalic and atraumatic.  Right Ear: External ear normal.     Left Ear: External ear normal.     Mouth/Throat:     Mouth: Mucous membranes are dry.  Eyes:     Conjunctiva/sclera: Conjunctivae normal.  Cardiovascular:     Rate and Rhythm: Regular rhythm. Tachycardia present.     Heart sounds: No murmur heard.   Pulmonary:     Effort: Pulmonary effort is normal. No respiratory distress.     Breath sounds: Normal breath sounds.  Abdominal:     Palpations: Abdomen is soft.     Tenderness: There is no abdominal tenderness.  Musculoskeletal:     Cervical back: Neck supple.  Skin:    General: Skin is warm and dry.     Capillary Refill: Capillary refill takes more than 3 seconds.  Neurological:     Mental Status: He is alert and oriented to person, place, and time.  Psychiatric:        Mood and Affect: Mood normal.     2+ bilateral radial and DP pulses.  No tenderness step-offs deformities over the C/T/L-spine.  There is little bit of tenderness over the lateral aspect of the right hip but patient has full strength in his bilateral upper and lower extremities.  Cranial nerves II through XII grossly intact.  Patient is oriented.  No other obvious trauma to the face scalp head or neck. ____________________________________________   LABS (all labs ordered are listed, but only abnormal results are displayed)  Labs Reviewed  BLOOD GAS, VENOUS - Abnormal;  Notable for the following components:      Result Value   pO2, Ven <31.0 (*)    All other components within normal limits  COMPREHENSIVE METABOLIC PANEL - Abnormal; Notable for the following components:   Sodium 125 (*)    Chloride 79 (*)    CO2 21 (*)    Glucose, Bld 834 (*)    BUN 55 (*)    Creatinine, Ser 5.75 (*)    Total Protein 9.4 (*)    Albumin 5.1 (*)    GFR, Estimated 11 (*)    Anion gap 25 (*)    All other components within normal limits  CBC WITH DIFFERENTIAL/PLATELET - Abnormal; Notable for the following components:   Neutro Abs 8.8 (*)    All other components within normal limits  LACTIC ACID, PLASMA - Abnormal; Notable for the following components:   Lactic Acid, Venous 6.0 (*)    All other components within normal limits  TROPONIN I (HIGH SENSITIVITY) - Abnormal; Notable for the following components:   Troponin I (High Sensitivity) 36 (*)    All other components within normal limits  RESP PANEL BY RT-PCR (FLU A&B, COVID) ARPGX2  CULTURE, BLOOD (SINGLE)  URINE CULTURE  BETA-HYDROXYBUTYRIC ACID  CK  MAGNESIUM  BRAIN NATRIURETIC PEPTIDE  PROTIME-INR  APTT  LACTIC ACID, PLASMA  URINALYSIS, COMPLETE (UACMP) WITH MICROSCOPIC  BASIC METABOLIC PANEL  OSMOLALITY  BASIC METABOLIC PANEL  BASIC METABOLIC PANEL  BASIC METABOLIC PANEL  BASIC METABOLIC PANEL  OSMOLALITY  HEMOGLOBIN A1C  CBG MONITORING, ED  TROPONIN I (HIGH SENSITIVITY)   ____________________________________________  EKG  Tachycardia with ventricular rate of 144, prolonged QTc interval and multiple nonspecific changes. ____________________________________________  RADIOLOGY  ED MD interpretation: CT head shows no acute intracranial hemorrhage, skull fracture or other clear acute intracranial process.  CT C-spine is unremarkable.  No evidence of acute C-spine injury.  Chest x-ray has no focal elevation, effusion, significant edema pneumothorax or other clear  acute intrathoracic process.  Plain  film of the right hip shows no acute fracture dislocation.  Official radiology report(s): CT Head Wo Contrast  Result Date: 04/06/2021 CLINICAL DATA:  Minor head trauma with normal mental status. EXAM: CT HEAD WITHOUT CONTRAST CT CERVICAL SPINE WITHOUT CONTRAST TECHNIQUE: Multidetector CT imaging of the head and cervical spine was performed following the standard protocol without intravenous contrast. Multiplanar CT image reconstructions of the cervical spine were also generated. COMPARISON:  None. FINDINGS: CT HEAD FINDINGS Brain: There is no evidence for acute hemorrhage, hydrocephalus, mass lesion, or abnormal extra-axial fluid collection. No definite CT evidence for acute infarction. Vascular: No hyperdense vessel or unexpected calcification. Skull: No evidence for fracture. No worrisome lytic or sclerotic lesion. Sinuses/Orbits: The visualized paranasal sinuses and mastoid air cells are clear. Visualized portions of the globes and intraorbital fat are unremarkable. Other: None. CT CERVICAL SPINE FINDINGS Alignment: Straightening of normal cervical lordosis evident. Skull base and vertebrae: No acute fracture. No primary bone lesion or focal pathologic process. Soft tissues and spinal canal: No prevertebral fluid or swelling. No visible canal hematoma. Disc levels: Mild degenerative changes at C6-7. Otherwise unremarkable. Upper chest: Negative. Other: None. IMPRESSION: 1. Unremarkable CT evaluation of the brain. No acute intracranial abnormality. 2. No cervical spine fracture. 3. Loss of cervical lordosis. This can be related to patient positioning, muscle spasm or soft tissue injury. Electronically Signed   By: Misty Stanley M.D.   On: 04/06/2021 14:45   CT Cervical Spine Wo Contrast  Result Date: 04/06/2021 CLINICAL DATA:  Minor head trauma with normal mental status. EXAM: CT HEAD WITHOUT CONTRAST CT CERVICAL SPINE WITHOUT CONTRAST TECHNIQUE: Multidetector CT imaging of the head and cervical spine  was performed following the standard protocol without intravenous contrast. Multiplanar CT image reconstructions of the cervical spine were also generated. COMPARISON:  None. FINDINGS: CT HEAD FINDINGS Brain: There is no evidence for acute hemorrhage, hydrocephalus, mass lesion, or abnormal extra-axial fluid collection. No definite CT evidence for acute infarction. Vascular: No hyperdense vessel or unexpected calcification. Skull: No evidence for fracture. No worrisome lytic or sclerotic lesion. Sinuses/Orbits: The visualized paranasal sinuses and mastoid air cells are clear. Visualized portions of the globes and intraorbital fat are unremarkable. Other: None. CT CERVICAL SPINE FINDINGS Alignment: Straightening of normal cervical lordosis evident. Skull base and vertebrae: No acute fracture. No primary bone lesion or focal pathologic process. Soft tissues and spinal canal: No prevertebral fluid or swelling. No visible canal hematoma. Disc levels: Mild degenerative changes at C6-7. Otherwise unremarkable. Upper chest: Negative. Other: None. IMPRESSION: 1. Unremarkable CT evaluation of the brain. No acute intracranial abnormality. 2. No cervical spine fracture. 3. Loss of cervical lordosis. This can be related to patient positioning, muscle spasm or soft tissue injury. Electronically Signed   By: Misty Stanley M.D.   On: 04/06/2021 14:45   DG Chest Portable 1 View  Result Date: 04/06/2021 CLINICAL DATA:  Fall.  Hyperglycemia.  Nausea, vomiting. EXAM: PORTABLE CHEST 1 VIEW COMPARISON:  10/26/2020 FINDINGS: Patient's LEFT-sided transvenous pacemaker leads to the RIGHT atrium and RIGHT ventricle. Heart is normal in size. The lungs are free of focal consolidations and pleural effusions. No pulmonary edema. No pneumothorax or acute displaced rib fractures. IMPRESSION: No active disease. Electronically Signed   By: Nolon Nations M.D.   On: 04/06/2021 14:43   DG Hip Unilat W or Wo Pelvis 2-3 Views Right  Result  Date: 04/06/2021 CLINICAL DATA:  Fall. EXAM: DG HIP (WITH OR WITHOUT PELVIS)  2-3V RIGHT COMPARISON:  None. FINDINGS: There is no evidence of hip fracture or dislocation. There is no evidence of arthropathy or other focal bone abnormality. IMPRESSION: Negative. Electronically Signed   By: Misty Stanley M.D.   On: 04/06/2021 14:38    ____________________________________________   PROCEDURES  Procedure(s) performed (including Critical Care):  .Critical Care Performed by: Lucrezia Starch, MD Authorized by: Lucrezia Starch, MD   Critical care provider statement:    Critical care time (minutes):  45   Critical care was necessary to treat or prevent imminent or life-threatening deterioration of the following conditions:  Shock, dehydration and endocrine crisis   Critical care was time spent personally by me on the following activities:  Discussions with consultants, evaluation of patient's response to treatment, examination of patient, ordering and performing treatments and interventions, ordering and review of laboratory studies, ordering and review of radiographic studies, pulse oximetry, re-evaluation of patient's condition, obtaining history from patient or surrogate and review of old charts     ____________________________________________   INITIAL IMPRESSION / Sac / ED COURSE        Patient presents with above to history exam for assessment of a couple days of some nausea and vomiting as well as worsening weakness and inability to take his medications and several falls.  On arrival he is tachycardic hypertensive and extremely dehydrated appearing.  He has some soreness of his right hip endorses hitting his head but there is no other obvious trauma on exam.  With regard to recent falls given soreness on exam with overall very sick appearing patient and tachycardia will obtain CT head and C-spine as well as a chest x-ray and plain film of her hip.  These were obtained  and showed no clear intracranial hemorrhage skull fracture or C-spine injury or right hip fracture.  In addition chest x-ray shows no pneumothorax and no pneumothorax or rib fracture.  With regard to etiology for his nausea vomiting weakness suspect likely metabolic derangements as his were to have sugars greater than 600 with EMS.  He has not had any diarrhea urinary symptoms and there are no other findings on exam to suggest a cellulitis or other clear source of infection at this time.  He denies any toxic ingestions.  VBG with a pH of 7.34 with a PCO2 of 49 and a bicarb of 26.4.  CMP remarkable for NA of 125, chloride of 79, bicarb 21, glucose of 834, BUN of 55, creatinine of 5.75 and anion gap of 25.  Beta hydroxybutyrate is 0.14 within normal limits.  Troponin is slightly elevated at 36 which I suspect represents to mild demand ischemia in the setting of severe dehydration.  Low suspicion for ACS at this time.  CK is 225 not consistent with acute rhabdo.  Magnesium is WNL.  BNP is 11.  This creatinine is compared to that obtained 3 months ago at 0.82 and represents an acute kidney injury.  CBC remarkable for no leukocytosis or acute anemia.  Initial lactic acid is elevated at 6 which I suspect represents fairly severe dehydration lower suspicion for sepsis given absence of fever or leukocytosis.  Overall labs more consistent with HHS and DKA.  Patient given fluid bolus and started on insulin drip as well as maintenance fluid.  Admitted to medicine service for further evaluation and management.     ____________________________________________   FINAL CLINICAL IMPRESSION(S) / ED DIAGNOSES  Final diagnoses:  Hyperosmolar hyperglycemic state (HHS) ([place])  Shock ([place])  Dehydration  AKI (acute kidney injury) ([place])    Medications  insulin regular, human (MYXREDLIN) 100 units/ 100 mL infusion (has no administration in time range)  lactated ringers infusion (has no administration in time range)   dextrose 5 % in lactated ringers infusion (has no administration in time range)  dextrose 50 % solution 0-50 mL (has no administration in time range)  potassium chloride 10 mEq in 100 mL IVPB (has no administration in time range)  pantoprazole (PROTONIX) EC tablet 40 mg (has no administration in time range)  enoxaparin (LOVENOX) injection 40 mg (has no administration in time range)  lactated ringers bolus 1,000 mL (1,000 mLs Intravenous New Bag/Given 04/06/21 1347)  ondansetron (ZOFRAN) injection 4 mg (4 mg Intravenous Given 04/06/21 1346)     ED Discharge Orders    None       Note:  This document was prepared using Dragon voice recognition software and may include unintentional dictation errors.   Lucrezia Starch, MD 04/06/21 [phone]

## 2021-04-06 NOTE — ED Notes (Signed)
Critical results- lactic acid: 6 and Glucose 834. Katrinka Blazing, MD aware.

## 2021-04-06 NOTE — H&P (Signed)
History and Physical   Travis Lucas UDJ:[id number] DOB: 11-02-1971 DOA: 04/06/2021  PCP: Travis Reusing, Travis Lucas  Outpatient Specialists: Dr. Garen Lucas Patient coming from: Home via EMS  I have personally briefly reviewed patient's old medical records in Travis Lucas.  Chief Concern: Hyperglycemia and nausea and vomiting  HPI: Travis Lucas is a 50 y.o. male with medical history significant for nonischemic cardiomyopathy, status post [employer] AICD placed on 08/2021, hypertension, hyperlipidemia, tobacco use, insulin-dependent diabetes type 1, presents emergency department for chief concerns of high sugar and nausea and vomiting.  He endorses nausea, vomiting, green vomitus, since Saturday, 04/03/2021. He states that he last ate a raw steak on Saturday and has been nauseous and vomiting since.   Social history: lives with sister. He smokes 2-4 cigarettess per day. Occasionally beer at social events. He smokes marijuana. He cooks for a living.  Vaccination: one dose of Moderna  ROS: Constitutional: no weight change, no fever ENT/Mouth: no sore throat, no rhinorrhea Eyes: no eye pain, no vision changes Cardiovascular: no chest pain, no dyspnea,  no edema, no palpitations Respiratory: no cough, no sputum, no wheezing Gastrointestinal: + nausea, + vomiting, no diarrhea, no constipation Genitourinary: no urinary incontinence, no dysuria, no hematuria Musculoskeletal: no arthralgias, no myalgias Skin: no skin lesions, no pruritus, Neuro: + weakness, no loss of consciousness, no syncope Psych: no anxiety, no depression, + decrease appetite Heme/Lymph: no bruising, no bleeding  ED Course: Discussed with ED provider, patient requiring hospitalization for chief concerns of hyperglycemia, suspect HHS.  Vitals in the emergency department was remarkable for temperature of 98.3, respiration rate of 13, blood pressure 146/102, heart rate of 125, SPO2 of 100% on room air.  Labs in the  emergency department was remarkable for sodium 125, potassium 4.8, chloride 79, bicarb 21, BUN 55, serum creatinine of 5.75, nonfasting blood glucose of 834, magnesium 2.3, BNP 11, CK2 25, high sensitive troponin 36, lactic acid 6.0.  CBC was unremarkable.  Assessment/Plan  Principal Problem:   Hyperosmolar hyperglycemic state (HHS) (Travis Lucas) Active Problems:   Primary hypertension   Smoking   Peripheral neuropathy   Ischemic cardiomyopathy with implantable cardioverter-defibrillator (ICD)   History of erectile dysfunction   Hyperglycemia due to type 1 diabetes mellitus (Travis Lucas)   AKI (acute kidney injury) (Travis Lucas)   Hyperosmolar hyponatremia   Hyperglycemia secondary to hyperosmolar hyperglycemic state Gastroenteritis Lactic acid elevated secondary to HHS - Patient has not taken his insulin since Saturday due to nausea and vomiting and no p.o. intake - Patient started on insulin via Travis Lucas per Travis Lucas - Status post lactated Ringer's 1 L bolus - Lactated Ringer's 125 mL/h, initiated - Status post potassium chloride 10 mEq, x2 doses ordered - BMP every 4 hours - N.p.o. - Admit to stepdown, observation, with telemetry  Elevated high-sensitivity troponin-secondary to HHS, treat as above, will monitor high sensitive troponin  Elevated lactic acid-secondary to HHS, treat as above  Hyperosmolar hyperglycemic hyponatremia-treat as above -Corrected serum sodium is 137  Acute kidney injury secondary to HHS, treat as above, IVF - I resumed Lasix, spironolactone, Entresto for 04/07/2021 -BMP every 4 hours Nonischemic cardiomyopathy- patient appears dry at this time, and compensated - BNP is not elevated - Resumed heart failure medications, Lasix 20 mg daily, metoprolol succinate 100 mg daily, Entresto 97-1 03, spironolactone 25 mg daily for 04/07/2021  Chart reviewed.   01/25/2021: Ejection fraction was 20 to 58%, grade 1 diastolic dysfunction  85/12/7739: Ejection fraction at that time was  read as  less than 80%, grade 3 diastolic dysfunction  Patient had hospitalization from 01/24/2021 to 01/30/2021: For acute pancreatitis, hyperosmolar hyperglycemic state, acute kidney injury.  DVT prophylaxis: Enoxaparin weight-based, subcutaneous, every 24 hours Code Status: Full code Diet: N.p.o. Family Communication: No Disposition Plan: Pending clinical course Consults called: None at this time Admission status: Stepdown, observation, with telemetry, 3 days  Past Medical History:  Diagnosis Date  . BOOP (bronchiolitis obliterans with organizing pneumonia) (Travis Lucas)   . CHF (congestive heart failure) ([place])   . Chronic back pain   . Diabetes mellitus without complication ([place])   . Hyperlipidemia   . Hypertension   . Ischemic cardiomyopathy with implantable cardioverter-defibrillator (ICD)    Past Surgical History:  Procedure Laterality Date  . CARDIAC DEFIBRILLATOR PLACEMENT  09/10/2019  . CHOLECYSTECTOMY     Social History:  reports that he has been smoking cigarettes. He has never used smokeless tobacco. He reports current alcohol use. He reports current drug use. Drug: Marijuana.  Allergies  Allergen Reactions  . Penicillins    No family history on file. Family history: Family history reviewed and not pertinent  Prior to Admission medications   Medication Sig Start Date End Date Taking? Authorizing Provider  atorvastatin (LIPITOR) 80 MG tablet TAKE ONE TABLET BY MOUTH EVERY DAY 03/17/21 07/08/21  Travis Lucas, Travis Lucas, Travis Lucas  blood glucose meter kit and supplies KIT Dispense based on patient and insurance preference. Use up to four times daily as directed. (FOR ICD-9 250.00, 250.01). 01/06/21   Travis Quan, Travis Lucas  COMFORT EZ PEN NEEDLES 32G X 4 MM MISC AS DIRECTED 02/23/21 02/23/22    furosemide (LASIX) 20 MG tablet TAKE ONE TABLET BY MOUTH EVERY DAY 02/19/21 02/19/22  Travis Sable, Travis Lucas  gabapentin (NEURONTIN) 300 MG capsule TAKE ONE CAPSULE BY MOUTH AT BEDTIME 03/17/21  03/17/22  Travis Lucas, Travis Lucas, Travis Lucas  insulin aspart (NOVOLOG) 100 UNIT/ML FlexPen INJECT 22 UNITS INTO THE SKIN 3 TIMES A DAY WITH MEALS 02/24/21 02/24/22  Travis Lucas, Travis Lucas, Travis Lucas  Insulin Glargine (BASAGLAR KWIKPEN) 100 UNIT/ML INJECT 48 UNITS INTO THE SKIN AT BEDTIME (REPLACES LANTUS) 03/01/21 03/01/22  Travis Lucas, Travis Lucas, Travis Lucas  Insulin Pen Needle (PEN NEEDLES) 31G X 5 MM MISC 15 Units by Does not apply route 2 (two) times daily. 11/02/20   Lorella Nimrod, Travis Lucas  Insulin Pen Needle 32G X 4 MM MISC USE WITH INSULIN 2 TIMES A DAY 11/02/20 11/02/21  Lorella Nimrod, Travis Lucas  lidocaine (LIDODERM) 5 % APPLY PATCH ONTO THE SKIN AS DIRECTED BY Travis Lucas. LEAVE ON FOR UP TO 12 HOURS ONLY, THEN REMOVE AND DISCARD PATCH. 02/10/21 10/27/21  Travis Lucas, Travis Lucas, Travis Lucas  metoprolol succinate (TOPROL-XL) 100 MG 24 hr tablet TAKE ONE TABLET BY MOUTH. TAKE WITH OR IMMEDIATELY FOLLOWING A MEAL 12/11/20 12/11/21  Travis Sable, Travis Lucas  mirtazapine (REMERON) 15 MG tablet TAKE ONE TABLET BY MOUTH ONCE DAILY AT BEDTIME FOR INSOMNIA OR ANXIETY. 03/22/21     RIGHTEST GS550 BLOOD GLUCOSE test strip AS DIRECTED 02/23/21 02/23/22    sacubitril-valsartan (ENTRESTO) 97-103 MG TAKE ONE TABLET BY MOUTH 2 TIMES A DAY 02/19/21 02/19/22  Travis Sable, Travis Lucas  spironolactone (ALDACTONE) 25 MG tablet TAKE ONE TABLET BY MOUTH EVERY DAY 11/02/20 07/28/21  Lorella Nimrod, Travis Lucas  vitamin B-12 (CYANOCOBALAMIN) 1000 MCG tablet Take 1 tablet (1,000 mcg total) by mouth daily. 01/06/21   Tukov-Yual, Arlyss Gandy, Travis Lucas  Vitamin D, Ergocalciferol, (DRISDOL) 1.25 MG (50000 UNIT) CAPS capsule TAKE ONE CAPSULE BY MOUTH ONCE EVERY WEEK ON THE SAME DAY  01/06/21 01/06/22  Tukov-Yual, Arlyss Gandy, Travis Lucas  glipiZIDE (GLUCOTROL XL) 2.5 MG 24 hr tablet Take one tablet with lunch 01/06/21 02/04/21  Tukov-Yual, Arlyss Gandy, Travis Lucas  losartan (COZAAR) 50 MG tablet Take 1 tablet (50 mg total) by mouth daily. Patient not taking: Reported on 02/04/2021 01/13/21 02/04/21  Vickie Epley, Travis Lucas   Physical Exam: Vitals:    04/06/21 1330 04/06/21 1500 04/06/21 1530 04/06/21 1603  BP: 124/87 (!) 146/102 (!) 163/96 (!) 154/108  Pulse: (!) 116 (!) 125 (!) 125   Resp: (!) 21 16 18    Temp:    98.3 F (36.8 C)  TempSrc:    Oral  SpO2: 100% 100% 100% 100%  Weight:    74.7 kg  Height:    6' 2"  (1.88 m)   Constitutional: appears age-appropriate, frail, NAD, calm, comfortable Eyes: PERRL, lids and conjunctivae normal ENMT: Mucous membranes are dry. Posterior pharynx clear of any exudate or lesions. Age-appropriate dentition. Hearing appropriate Neck: normal, supple, no masses, no thyromegaly Respiratory: clear to auscultation bilaterally, no wheezing, no crackles. Normal respiratory effort. No accessory muscle use.  Cardiovascular: Regular rate and rhythm, no murmurs / rubs / gallops. No extremity edema. 2+ pedal pulses. No carotid bruits.  Abdomen: no tenderness, no masses palpated, no hepatosplenomegaly. Bowel sounds positive.  Musculoskeletal: no clubbing / cyanosis. No joint deformity upper and lower extremities. Good ROM, no contractures, no atrophy. Normal muscle tone.  Skin: no rashes, lesions, ulcers. No induration Neurologic: Sensation intact. Strength 5/5 in all 4.  Psychiatric: Normal judgment and insight. Alert and oriented x 3. Normal mood.   EKG: independently reviewed, showing sinus tachycardia with rate of 144, QTc 550  Chest x-ray on Admission: I personally reviewed and I agree with radiologist reading as below.  CT Head Wo Contrast  Result Date: 04/06/2021 CLINICAL DATA:  Minor head trauma with normal mental status. EXAM: CT HEAD WITHOUT CONTRAST CT CERVICAL SPINE WITHOUT CONTRAST TECHNIQUE: Multidetector CT imaging of the head and cervical spine was performed following the standard protocol without intravenous contrast. Multiplanar CT image reconstructions of the cervical spine were also generated. COMPARISON:  None. FINDINGS: CT HEAD FINDINGS Brain: There is no evidence for acute hemorrhage,  hydrocephalus, mass lesion, or abnormal extra-axial fluid collection. No definite CT evidence for acute infarction. Vascular: No hyperdense vessel or unexpected calcification. Skull: No evidence for fracture. No worrisome lytic or sclerotic lesion. Sinuses/Orbits: The visualized paranasal sinuses and mastoid air cells are clear. Visualized portions of the globes and intraorbital fat are unremarkable. Other: None. CT CERVICAL SPINE FINDINGS Alignment: Straightening of normal cervical lordosis evident. Skull base and vertebrae: No acute fracture. No primary bone lesion or focal pathologic process. Soft tissues and spinal canal: No prevertebral fluid or swelling. No visible canal hematoma. Disc levels: Mild degenerative changes at C6-7. Otherwise unremarkable. Upper chest: Negative. Other: None. IMPRESSION: 1. Unremarkable CT evaluation of the brain. No acute intracranial abnormality. 2. No cervical spine fracture. 3. Loss of cervical lordosis. This can be related to patient positioning, muscle spasm or soft tissue injury. Electronically Signed   By: Misty Stanley M.D.   On: 04/06/2021 14:45   CT Cervical Spine Wo Contrast  Result Date: 04/06/2021 CLINICAL DATA:  Minor head trauma with normal mental status. EXAM: CT HEAD WITHOUT CONTRAST CT CERVICAL SPINE WITHOUT CONTRAST TECHNIQUE: Multidetector CT imaging of the head and cervical spine was performed following the standard protocol without intravenous contrast. Multiplanar CT image reconstructions of the cervical spine were also generated. COMPARISON:  None. FINDINGS: CT HEAD FINDINGS Brain: There is no evidence for acute hemorrhage, hydrocephalus, mass lesion, or abnormal extra-axial fluid collection. No definite CT evidence for acute infarction. Vascular: No hyperdense vessel or unexpected calcification. Skull: No evidence for fracture. No worrisome lytic or sclerotic lesion. Sinuses/Orbits: The visualized paranasal sinuses and mastoid air cells are clear.  Visualized portions of the globes and intraorbital fat are unremarkable. Other: None. CT CERVICAL SPINE FINDINGS Alignment: Straightening of normal cervical lordosis evident. Skull base and vertebrae: No acute fracture. No primary bone lesion or focal pathologic process. Soft tissues and spinal canal: No prevertebral fluid or swelling. No visible canal hematoma. Disc levels: Mild degenerative changes at C6-7. Otherwise unremarkable. Upper chest: Negative. Other: None. IMPRESSION: 1. Unremarkable CT evaluation of the brain. No acute intracranial abnormality. 2. No cervical spine fracture. 3. Loss of cervical lordosis. This can be related to patient positioning, muscle spasm or soft tissue injury. Electronically Signed   By: Misty Stanley M.D.   On: 04/06/2021 14:45   DG Chest Portable 1 View  Result Date: 04/06/2021 CLINICAL DATA:  Fall.  Hyperglycemia.  Nausea, vomiting. EXAM: PORTABLE CHEST 1 VIEW COMPARISON:  10/26/2020 FINDINGS: Patient's LEFT-sided transvenous pacemaker leads to the RIGHT atrium and RIGHT ventricle. Heart is normal in size. The lungs are free of focal consolidations and pleural effusions. No pulmonary edema. No pneumothorax or acute displaced rib fractures. IMPRESSION: No active disease. Electronically Signed   By: Nolon Nations M.D.   On: 04/06/2021 14:43   DG Hip Unilat W or Wo Pelvis 2-3 Views Right  Result Date: 04/06/2021 CLINICAL DATA:  Fall. EXAM: DG HIP (WITH OR WITHOUT PELVIS) 2-3V RIGHT COMPARISON:  None. FINDINGS: There is no evidence of hip fracture or dislocation. There is no evidence of arthropathy or other focal bone abnormality. IMPRESSION: Negative. Electronically Signed   By: Misty Stanley M.D.   On: 04/06/2021 14:38   Labs on Admission: I have personally reviewed following labs  CBC: Recent Labs  Lab 04/06/21 1312  WBC 10.2  NEUTROABS 8.8*  HGB 14.3  HCT 43.1  MCV 89.2  PLT 854   Basic Metabolic Panel: Recent Labs  Lab 04/06/21 1312  NA 125*  K  4.8  CL 79*  CO2 21*  GLUCOSE 834*  BUN 55*  CREATININE 5.75*  CALCIUM 9.8  MG 2.3   GFR: Estimated Creatinine Clearance: 16.6 mL/min (A) (by C-G formula based on SCr of 5.75 mg/dL (H)).  Liver Function Tests: Recent Labs  Lab 04/06/21 1312  AST 34  ALT 39  ALKPHOS 96  BILITOT 0.9  PROT 9.4*  ALBUMIN 5.1*   Coagulation Profile: Recent Labs  Lab 04/06/21 1345  INR 1.1   Cardiac Enzymes: Recent Labs  Lab 04/06/21 1312  CKTOTAL 225   Urine analysis:    Component Value Date/Time   COLORURINE AMBER (A) 09/21/2020 1834   APPEARANCEUR Clear 01/06/2021 1257   LABSPEC 1.031 (H) 09/21/2020 1834   PHURINE 5.0 09/21/2020 1834   GLUCOSEU 3+ (A) 01/06/2021 1257   HGBUR SMALL (A) 09/21/2020 1834   BILIRUBINUR Negative 01/06/2021 [place] 09/21/2020 1834   PROTEINUR 2+ (A) 01/06/2021 1257   PROTEINUR >=300 (A) 09/21/2020 1834   NITRITE Negative 01/06/2021 1257   NITRITE NEGATIVE 09/21/2020 1834   LEUKOCYTESUR Negative 01/06/2021 1257   LEUKOCYTESUR NEGATIVE 09/21/2020 1834   Jaren Kearn N Chigozie Basaldua D.O. [facility]  If 7PM-7AM, please contact overnight-coverage provider If 7AM-7PM, please contact day coverage provider [email or web address]  04/06/2021,  4:42 PM

## 2021-04-06 NOTE — ED Notes (Signed)
Informed RN bed assigned 1520

## 2021-04-07 ENCOUNTER — Other Ambulatory Visit: Payer: Medicaid Other

## 2021-04-07 ENCOUNTER — Encounter: Payer: Self-pay | Admitting: Internal Medicine

## 2021-04-07 LAB — BASIC METABOLIC PANEL
Anion gap: 12 (ref 5–15)
Anion gap: 17 — ABNORMAL HIGH (ref 5–15)
BUN: 61 mg/dL — ABNORMAL HIGH (ref 6–20)
BUN: 63 mg/dL — ABNORMAL HIGH (ref 6–20)
CO2: 25 mmol/L (ref 22–32)
CO2: 28 mmol/L (ref 22–32)
Calcium: 9.1 mg/dL (ref 8.9–10.3)
Calcium: 9.2 mg/dL (ref 8.9–10.3)
Chloride: 92 mmol/L — ABNORMAL LOW (ref 98–111)
Chloride: 94 mmol/L — ABNORMAL LOW (ref 98–111)
Creatinine, Ser: 2.98 mg/dL — ABNORMAL HIGH (ref 0.61–1.24)
Creatinine, Ser: 3.68 mg/dL — ABNORMAL HIGH (ref 0.61–1.24)
GFR, Estimated: 19 mL/min — ABNORMAL LOW (ref >60)
GFR, Estimated: 25 mL/min — ABNORMAL LOW (ref >60)
Glucose, Bld: 181 mg/dL — ABNORMAL HIGH (ref 70–99)
Glucose, Bld: 250 mg/dL — ABNORMAL HIGH (ref 70–99)
Potassium: 3.1 mmol/L — ABNORMAL LOW (ref 3.5–5.1)
Potassium: 3.4 mmol/L — ABNORMAL LOW (ref 3.5–5.1)
Sodium: 134 mmol/L — ABNORMAL LOW (ref 135–145)
Sodium: 134 mmol/L — ABNORMAL LOW (ref 135–145)

## 2021-04-07 LAB — GLUCOSE, CAPILLARY
Glucose-Capillary: 158 mg/dL — ABNORMAL HIGH (ref 70–99)
Glucose-Capillary: 166 mg/dL — ABNORMAL HIGH (ref 70–99)
Glucose-Capillary: 168 mg/dL — ABNORMAL HIGH (ref 70–99)
Glucose-Capillary: 168 mg/dL — ABNORMAL HIGH (ref 70–99)
Glucose-Capillary: 178 mg/dL — ABNORMAL HIGH (ref 70–99)
Glucose-Capillary: 188 mg/dL — ABNORMAL HIGH (ref 70–99)
Glucose-Capillary: 198 mg/dL — ABNORMAL HIGH (ref 70–99)
Glucose-Capillary: 199 mg/dL — ABNORMAL HIGH (ref 70–99)
Glucose-Capillary: 215 mg/dL — ABNORMAL HIGH (ref 70–99)
Glucose-Capillary: 246 mg/dL — ABNORMAL HIGH (ref 70–99)
Glucose-Capillary: 249 mg/dL — ABNORMAL HIGH (ref 70–99)
Glucose-Capillary: 91 mg/dL (ref 70–99)

## 2021-04-07 LAB — URINALYSIS, COMPLETE (UACMP) WITH MICROSCOPIC
Bacteria, UA: NONE SEEN
Bilirubin Urine: NEGATIVE
Glucose, UA: >=500 mg/dL — AB
Hgb urine dipstick: NEGATIVE
Ketones, ur: 5 mg/dL — AB
Leukocytes,Ua: NEGATIVE
Nitrite: NEGATIVE
Protein, ur: 30 mg/dL — AB
Specific Gravity, Urine: 1.02 (ref 1.005–1.030)
pH: 5 (ref 5.0–8.0)

## 2021-04-07 MED ORDER — INSULIN GLARGINE 100 UNIT/ML ~~LOC~~ SOLN
48.0000 [IU] | Freq: Every day | SUBCUTANEOUS | Status: DC
Start: 2021-04-07 — End: 2021-04-09
  Administered 2021-04-07 – 2021-04-09 (×3): 48 [IU] via SUBCUTANEOUS
  Filled 2021-04-07 (×3): qty 0.48

## 2021-04-07 MED ORDER — INSULIN ASPART 100 UNIT/ML IJ SOLN
8.0000 [IU] | Freq: Three times a day (TID) | INTRAMUSCULAR | Status: DC
  Administered 2021-04-07 – 2021-04-09 (×4): 8 [IU] via SUBCUTANEOUS
  Filled 2021-04-07 (×4): qty 1

## 2021-04-07 MED ORDER — ENOXAPARIN SODIUM 40 MG/0.4ML IJ SOSY
40.0000 mg | PREFILLED_SYRINGE | INTRAMUSCULAR | Status: DC
  Administered 2021-04-07 – 2021-04-08 (×2): 40 mg via SUBCUTANEOUS
  Filled 2021-04-07 (×2): qty 0.4

## 2021-04-07 MED ORDER — LIDOCAINE 5 % EX PTCH
1.0000 | MEDICATED_PATCH | CUTANEOUS | Status: DC
  Administered 2021-04-07 – 2021-04-08 (×2): 1 via TRANSDERMAL
  Filled 2021-04-07 (×4): qty 1

## 2021-04-07 MED ORDER — LACTATED RINGERS IV BOLUS: 1000.0000 mL | Freq: Once | INTRAVENOUS | Status: DC

## 2021-04-07 MED ORDER — LACTATED RINGERS IV SOLN: INTRAVENOUS | Status: DC

## 2021-04-07 MED ORDER — POTASSIUM CHLORIDE 10 MEQ/100ML IV SOLN
10.0000 meq | INTRAVENOUS | Status: DC
  Filled 2021-04-07 (×4): qty 100

## 2021-04-07 MED ORDER — INSULIN GLARGINE 100 UNIT/ML ~~LOC~~ SOLN: 48.0000 [IU] | Freq: Every day | SUBCUTANEOUS | Status: DC

## 2021-04-07 MED ORDER — SODIUM CHLORIDE 0.9 % IV BOLUS
1000.0000 mL | Freq: Once | INTRAVENOUS | Status: AC
  Administered 2021-04-07: 1000 mL via INTRAVENOUS

## 2021-04-07 MED ORDER — INSULIN ASPART 100 UNIT/ML IJ SOLN: 0.0000 [IU] | Freq: Every day | INTRAMUSCULAR | Status: DC

## 2021-04-07 MED ORDER — POTASSIUM CHLORIDE CRYS ER 20 MEQ PO TBCR
40.0000 meq | EXTENDED_RELEASE_TABLET | Freq: Once | ORAL | Status: AC
  Administered 2021-04-07: 40 meq via ORAL
  Filled 2021-04-07: qty 2

## 2021-04-07 MED ORDER — PNEUMOCOCCAL VAC POLYVALENT 25 MCG/0.5ML IJ INJ
0.5000 mL | INJECTION | INTRAMUSCULAR | Status: AC
  Administered 2021-04-09: 0.5 mL via INTRAMUSCULAR
  Filled 2021-04-07: qty 0.5

## 2021-04-07 MED ORDER — HYDROCODONE-ACETAMINOPHEN 5-325 MG PO TABS
1.0000 | ORAL_TABLET | Freq: Four times a day (QID) | ORAL | Status: DC | PRN
Start: 2021-04-07 — End: 2021-04-09
  Administered 2021-04-07 – 2021-04-08 (×3): 1 via ORAL
  Filled 2021-04-07 (×3): qty 1

## 2021-04-07 MED ORDER — INSULIN ASPART 100 UNIT/ML IJ SOLN
0.0000 [IU] | Freq: Three times a day (TID) | INTRAMUSCULAR | Status: DC
  Administered 2021-04-07 – 2021-04-08 (×4): 4 [IU] via SUBCUTANEOUS
  Administered 2021-04-08 – 2021-04-09 (×2): 11 [IU] via SUBCUTANEOUS
  Filled 2021-04-07 (×6): qty 1

## 2021-04-07 NOTE — Progress Notes (Signed)
PHARMACY CONSULT NOTE - FOLLOW UP  Pharmacy Consult for Electrolyte Monitoring and Replacement   Recent Labs: Potassium (mmol/L)  Date Value  04/07/2021 3.4 (L)   Magnesium (mg/dL)  Date Value  49/75/3005 2.3   Calcium (mg/dL)  Date Value  09/29/1116 9.1   Albumin (g/dL)  Date Value  35/67/0141 5.1 (H)  01/06/2021 3.7 (L)   Sodium (mmol/L)  Date Value  04/07/2021 134 (L)  01/06/2021 137     Assessment: 5/11:  K @ 0014 = 3.1  5/11:  K @ 0341 = 3.4   Goal of Therapy:  Electrolytes WNL   Plan:  KCl 40 mEq PO X 1 ordered for 5/11 @ 0600. Will recheck electrolytes on 5/11 @ 0900.    Scherrie Gerlach ,PharmD Clinical Pharmacist 04/07/2021 5:52 AM

## 2021-04-07 NOTE — Progress Notes (Addendum)
PROGRESS NOTE    Travis Lucas  POE:[id number] DOB: 05/07/1971 DOA: 04/06/2021 PCP: Rolm Gala, NP  Brief Narrative: Travis Lucas is a 50 y.o. male with medical history significant for nonischemic cardiomyopathy, status post [employer] AICD placed on 08/2021, hypertension, hyperlipidemia, tobacco use, insulin-dependent diabetes type 1, presents emergency department for chief concerns of high sugar and nausea and vomiting. -In the ED, sodium was 125, bicarb was 21, creatinine was 5.7 blood blood glucose of 834, high-sensitivity troponin of 36 and lactic acid of 6.0  Assessment & Plan:    Hyperosmolar nonketotic hyperglycemia Uncontrolled type 2 diabetes mellitus Severe dehydration, lactic acidosis Nausea and vomiting - Patient has not taken his insulin since Saturday due to nausea and vomiting and no p.o. intake -Started on insulin drip via Endo tool in the ED, and given fluid boluses -Clinically improving, transition off IV insulin to Lantus this morning, he is also on NovoLog 21 units 3 times daily AC, last hemoglobin's A1c in February was 13.4 -Continue IV fluids 1 more day -BMP in a.m.  Elevated high-sensitivity troponin -Mild, with flat trend, do not suspect ACS  Lactic acidosis -Secondary to dehydration from above, resolved, abdominal exam is benign  Hyperosmolar hyperglycemic hyponatremia -Improving  Acute kidney injury secondary to HHS -Severe, creatinine 5.7 on admission, trending down with hydration down to 2.9 today -Baseline creatinine is normal -Hold Entresto Lasix and Aldactone -Monitor urine output, BMP in a.m.  Nonischemic cardiomyopathy -EF of 20-25% with grade 1 diastolic dysfunction based on echo in February -Clinically dry in the setting of HONK -Continue metoprolol, holding Entresto Lasix and Aldactone -Continue IV fluids today  DVT prophylaxis: Enoxaparin  Code Status: Full code Diet:  Diabetic Family Communication: No family at  bedside, discussed with patient in detail Status is: Observation  The patient will require care spanning > 2 midnights and should be moved to inpatient because: Inpatient level of care appropriate due to severity of illness  Dispo: The patient is from: Home              Anticipated d/c is to: Home              Patient currently is not medically stable to d/c.   Difficult to place patient No   Consultants:      Procedures:   Antimicrobials:    Subjective: -He is much better, still a bit tired, denies any nausea or vomiting this morning, denies chest pain or shortness of breath Objective: Vitals:   04/07/21 0813 04/07/21 1000 04/07/21 1100 04/07/21 1200  BP:  125/87 116/84 138/86  Pulse:  (!) 111 (!) 116 (!) 116  Resp:  16 13 18   Temp: 98.1 F (36.7 C)   98.3 F (36.8 C)  TempSrc: Oral   Oral  SpO2:  100% 100% 100%  Weight:      Height:        Intake/Output Summary (Last 24 hours) at 04/07/2021 1431 Last data filed at 04/07/2021 1106 Gross per 24 hour  Intake 3707.86 ml  Output 1200 ml  Net 2507.86 ml   Filed Weights   04/06/21 1308 04/06/21 1603  Weight: 74.7 kg 74.7 kg    Examination:  General exam: Pleasant young male, laying in bed, AAOx3, no distress CVS: S1-S2, regular rate rhythm Lungs: Clear bilaterally Abdomen: Soft, nontender, bowel sounds present Extremities: No edema Skin: No rashes on exposed skin  Psychiatry: Judgement and insight appear normal. Mood & affect appropriate.     Data Reviewed:  CBC: Recent Labs  Lab 04/06/21 1312  WBC 10.2  NEUTROABS 8.8*  HGB 14.3  HCT 43.1  MCV 89.2  PLT 240   Basic Metabolic Panel: Recent Labs  Lab 04/06/21 1312 04/06/21 1642 04/06/21 2059 04/07/21 0014 04/07/21 0341  NA 125* 128* 134* 134* 134*  K 4.8 3.9 3.3* 3.1* 3.4*  CL 79* 83* 90* 92* 94*  CO2 21* 25 27 25 28   GLUCOSE 834* 662* 200* 250* 181*  BUN 55* 58* 61* 63* 61*  CREATININE 5.75* 5.13* 4.52* 3.68* 2.98*  CALCIUM 9.8 9.5  9.7 9.2 9.1  MG 2.3  --   --   --   --    GFR: Estimated Creatinine Clearance: 32 mL/min (A) (by C-G formula based on SCr of 2.98 mg/dL (H)). Liver Function Tests: Recent Labs  Lab 04/06/21 1312  AST 34  ALT 39  ALKPHOS 96  BILITOT 0.9  PROT 9.4*  ALBUMIN 5.1*   Recent Labs  Lab 04/06/21 1642  LIPASE 31   No results for input(s): AMMONIA in the last 168 hours. Coagulation Profile: Recent Labs  Lab 04/06/21 1345  INR 1.1   Cardiac Enzymes: Recent Labs  Lab 04/06/21 1312  CKTOTAL 225   BNP (last 3 results) No results for input(s): PROBNP in the last 8760 hours. HbA1C: Recent Labs    04/06/21 1642  HGBA1C 10.4*   CBG: Recent Labs  Lab 04/07/21 0505 04/07/21 0607 04/07/21 0759 04/07/21 0911 04/07/21 1100  GLUCAP 168* 166* 188* 249* 199*   Lipid Profile: No results for input(s): CHOL, HDL, LDLCALC, TRIG, CHOLHDL, LDLDIRECT in the last 72 hours. Thyroid Function Tests: No results for input(s): TSH, T4TOTAL, FREET4, T3FREE, THYROIDAB in the last 72 hours. Anemia Panel: No results for input(s): VITAMINB12, FOLATE, FERRITIN, TIBC, IRON, RETICCTPCT in the last 72 hours. Urine analysis:    Component Value Date/Time   COLORURINE YELLOW (A) 04/06/2021 1310   APPEARANCEUR CLOUDY (A) 04/06/2021 1310   APPEARANCEUR Clear 01/06/2021 1257   LABSPEC 1.020 04/06/2021 1310   PHURINE 5.0 04/06/2021 1310   GLUCOSEU >=500 (A) 04/06/2021 1310   HGBUR NEGATIVE 04/06/2021 1310   BILIRUBINUR NEGATIVE 04/06/2021 1310   BILIRUBINUR Negative 01/06/2021 1257   KETONESUR 5 (A) 04/06/2021 1310   PROTEINUR 30 (A) 04/06/2021 1310   NITRITE NEGATIVE 04/06/2021 1310   LEUKOCYTESUR NEGATIVE 04/06/2021 1310   Sepsis Labs: @LABRCNTIP (procalcitonin:4,lacticidven:4)  ) Recent Results (from the past 240 hour(s))  Resp Panel by RT-PCR (Flu A&B, Covid) Nasopharyngeal Swab     Status: None   Collection Time: 04/06/21  1:09 PM   Specimen: Nasopharyngeal Swab; Nasopharyngeal(NP)  swabs in vial transport medium  Result Value Ref Range Status   SARS Coronavirus 2 by RT PCR NEGATIVE NEGATIVE Final    Comment: (NOTE) SARS-CoV-2 target nucleic acids are NOT DETECTED.  The SARS-CoV-2 RNA is generally detectable in upper respiratory specimens during the acute phase of infection. The lowest concentration of SARS-CoV-2 viral copies this assay can detect is 138 copies/mL. A negative result does not preclude SARS-Cov-2 infection and should not be used as the sole basis for treatment or other patient management decisions. A negative result may occur with  improper specimen collection/handling, submission of specimen other than nasopharyngeal swab, presence of viral mutation(s) within the areas targeted by this assay, and inadequate number of viral copies(<138 copies/mL). A negative result must be combined with clinical observations, patient history, and epidemiological information. The expected result is Negative.  Fact Sheet for Patients:   Fact Sheet for Healthcare Providers:  SeriousBroker.it  This test is no t yet approved or cleared by the [place] FDA and  has been authorized for detection and/or diagnosis of SARS-CoV-2 by FDA under an Emergency Use Authorization (EUA). This EUA will remain  in effect (meaning this test can be used) for the duration of the COVID-19 declaration under Section 564(b)(1) of the Act, 21 U.S.C.section 360bbb-3(b)(1), unless the authorization is terminated  or revoked sooner.       Influenza A by PCR NEGATIVE NEGATIVE Final   Influenza B by PCR NEGATIVE NEGATIVE Final    Comment: (NOTE) The Xpert Xpress SARS-CoV-2/FLU/RSV plus assay is intended as an aid in the diagnosis of influenza from Nasopharyngeal swab specimens and should not be used as a sole basis for treatment. Nasal washings and aspirates are unacceptable for Xpert Xpress  SARS-CoV-2/FLU/RSV testing.  Fact Sheet for Patients: BloggerCourse.com  Fact Sheet for Healthcare Providers: SeriousBroker.it  This test is not yet approved or cleared by the [place] FDA and has been authorized for detection and/or diagnosis of SARS-CoV-2 by FDA under an Emergency Use Authorization (EUA). This EUA will remain in effect (meaning this test can be used) for the duration of the COVID-19 declaration under Section 564(b)(1) of the Act, 21 U.S.C. section 360bbb-3(b)(1), unless the authorization is terminated or revoked.  Performed at [facility], [address] [place], [place] [address]   Blood culture (routine single)     Status: None (Preliminary result)   Collection Time: 04/06/21  2:40 PM   Specimen: BLOOD  Result Value Ref Range Status   Specimen Description BLOOD BLOOD RIGHT ARM  Final   Special Requests   Final    BOTTLES DRAWN AEROBIC AND ANAEROBIC Blood Culture adequate volume   Culture   Final    NO GROWTH < 24 HOURS Performed at [facility], [address] [place], [place] [address]    Report Status PENDING  Incomplete  MRSA PCR Screening     Status: None   Collection Time: 04/06/21 10:02 PM   Specimen: Nasopharyngeal  Result Value Ref Range Status   MRSA by PCR NEGATIVE NEGATIVE Final    Comment:        The GeneXpert MRSA Assay (FDA approved for NASAL specimens only), is one component of a comprehensive MRSA colonization surveillance program. It is not intended to diagnose MRSA infection nor to guide or monitor treatment for MRSA infections. Performed at [facility], [address], [place], [place] [address]          Radiology Studies: CT Head Wo Contrast  Result Date: 04/06/2021 CLINICAL DATA:  Minor head trauma with normal mental status. EXAM: CT HEAD WITHOUT CONTRAST CT CERVICAL SPINE WITHOUT CONTRAST TECHNIQUE: Multidetector CT imaging of the  head and cervical spine was performed following the standard protocol without intravenous contrast. Multiplanar CT image reconstructions of the cervical spine were also generated. COMPARISON:  None. FINDINGS: CT HEAD FINDINGS Brain: There is no evidence for acute hemorrhage, hydrocephalus, mass lesion, or abnormal extra-axial fluid collection. No definite CT evidence for acute infarction. Vascular: No hyperdense vessel or unexpected calcification. Skull: No evidence for fracture. No worrisome lytic or sclerotic lesion. Sinuses/Orbits: The visualized paranasal sinuses and mastoid air cells are clear. Visualized portions of the globes and intraorbital fat are unremarkable. Other: None. CT CERVICAL SPINE FINDINGS Alignment: Straightening of normal cervical lordosis evident. Skull base and vertebrae: No acute fracture. No primary bone lesion or focal pathologic process.  Soft tissues and spinal canal: No prevertebral fluid or swelling. No visible canal hematoma. Disc levels: Mild degenerative changes at C6-7. Otherwise unremarkable. Upper chest: Negative. Other: None. IMPRESSION: 1. Unremarkable CT evaluation of the brain. No acute intracranial abnormality. 2. No cervical spine fracture. 3. Loss of cervical lordosis. This can be related to patient positioning, muscle spasm or soft tissue injury. Electronically Signed   By: Kennith Center M.D.   On: 04/06/2021 14:45   CT Cervical Spine Wo Contrast  Result Date: 04/06/2021 CLINICAL DATA:  Minor head trauma with normal mental status. EXAM: CT HEAD WITHOUT CONTRAST CT CERVICAL SPINE WITHOUT CONTRAST TECHNIQUE: Multidetector CT imaging of the head and cervical spine was performed following the standard protocol without intravenous contrast. Multiplanar CT image reconstructions of the cervical spine were also generated. COMPARISON:  None. FINDINGS: CT HEAD FINDINGS Brain: There is no evidence for acute hemorrhage, hydrocephalus, mass lesion, or abnormal extra-axial fluid  collection. No definite CT evidence for acute infarction. Vascular: No hyperdense vessel or unexpected calcification. Skull: No evidence for fracture. No worrisome lytic or sclerotic lesion. Sinuses/Orbits: The visualized paranasal sinuses and mastoid air cells are clear. Visualized portions of the globes and intraorbital fat are unremarkable. Other: None. CT CERVICAL SPINE FINDINGS Alignment: Straightening of normal cervical lordosis evident. Skull base and vertebrae: No acute fracture. No primary bone lesion or focal pathologic process. Soft tissues and spinal canal: No prevertebral fluid or swelling. No visible canal hematoma. Disc levels: Mild degenerative changes at C6-7. Otherwise unremarkable. Upper chest: Negative. Other: None. IMPRESSION: 1. Unremarkable CT evaluation of the brain. No acute intracranial abnormality. 2. No cervical spine fracture. 3. Loss of cervical lordosis. This can be related to patient positioning, muscle spasm or soft tissue injury. Electronically Signed   By: Kennith Center M.D.   On: 04/06/2021 14:45   DG Chest Portable 1 View  Result Date: 04/06/2021 CLINICAL DATA:  Fall.  Hyperglycemia.  Nausea, vomiting. EXAM: PORTABLE CHEST 1 VIEW COMPARISON:  10/26/2020 FINDINGS: Patient's LEFT-sided transvenous pacemaker leads to the RIGHT atrium and RIGHT ventricle. Heart is normal in size. The lungs are free of focal consolidations and pleural effusions. No pulmonary edema. No pneumothorax or acute displaced rib fractures. IMPRESSION: No active disease. Electronically Signed   By: Norva Pavlov M.D.   On: 04/06/2021 14:43   DG Hip Unilat W or Wo Pelvis 2-3 Views Right  Result Date: 04/06/2021 CLINICAL DATA:  Fall. EXAM: DG HIP (WITH OR WITHOUT PELVIS) 2-3V RIGHT COMPARISON:  None. FINDINGS: There is no evidence of hip fracture or dislocation. There is no evidence of arthropathy or other focal bone abnormality. IMPRESSION: Negative. Electronically Signed   By: Kennith Center M.D.    On: 04/06/2021 14:38        Scheduled Meds: . atorvastatin  80 mg Oral QHS  . Chlorhexidine Gluconate Cloth  6 each Topical Daily  . enoxaparin (LOVENOX) injection  40 mg Subcutaneous Q24H  . famotidine  20 mg Oral QHS  . gabapentin  300 mg Oral QHS  . insulin aspart  0-20 Units Subcutaneous TID WC  . insulin aspart  0-5 Units Subcutaneous QHS  . insulin glargine  48 Units Subcutaneous Daily  . lidocaine  1 patch Transdermal Q24H  . metoprolol succinate  100 mg Oral Daily  . mirtazapine  15 mg Oral QHS  . [START ON 04/08/2021] pneumococcal 23 valent vaccine  0.5 mL Intramuscular Tomorrow-1000  . vitamin B-12  1,000 mcg Oral Daily   Continuous Infusions: .  lactated ringers 75 mL/hr at 04/07/21 1110     LOS: 0 days    Time spent: 35min  Zannie CovePreetha Denvil, MD [facility]  04/07/2021, 2:31 PM

## 2021-04-07 NOTE — Progress Notes (Signed)
Met with patient today. Provided presence and prayer. Patient seemed was very emotional, great conversation and hopefulness on his part. Will continue to follow up.

## 2021-04-07 NOTE — Progress Notes (Addendum)
PHARMACY CONSULT NOTE - FOLLOW UP  Pharmacy Consult for Electrolyte Monitoring and Replacement   Recent Labs: Potassium (mmol/L)  Date Value  04/07/2021 3.1 (L)   Magnesium (mg/dL)  Date Value  61/22/4497 2.3   Calcium (mg/dL)  Date Value  53/00/5110 9.2   Albumin (g/dL)  Date Value  21/09/7355 5.1 (H)  01/06/2021 3.7 (L)   Sodium (mmol/L)  Date Value  04/07/2021 134 (L)  01/06/2021 137     Assessment: 5/11:  K @ 0014 = 3.1  Goal of Therapy:  Electrolytes WNL   Plan:  Pt received KCl 40 mEq PO X 1 @ 2332.  Will recheck electrolytes on 5/11 @ 0500.   Scherrie Gerlach ,PharmD Clinical Pharmacist 04/07/2021 1:13 AM

## 2021-04-07 NOTE — Progress Notes (Addendum)
Inpatient Diabetes Program Recommendations  AACE/ADA: New Consensus Statement on Inpatient Glycemic Control   Target Ranges:  Prepandial:   less than 140 mg/dL      Peak postprandial:   less than 180 mg/dL (1-2 hours)      Critically ill patients:  140 - 180 mg/dL   Results for Travis Lucas (MRN [id number]) as of 04/07/2021 06:39  Ref. Range 04/07/2021 00:00 04/07/2021 01:06 04/07/2021 02:09 04/07/2021 03:05 04/07/2021 04:08 04/07/2021 05:05 04/07/2021 06:07  Glucose-Capillary Latest Ref Range: 70 - 99 mg/dL 176 (H) 160 (H) 737 (H) 178 (H) 168 (H) 168 (H) 166 (H)  Results for Travis Lucas (MRN [id number]) as of 04/07/2021 06:39  Ref. Range 04/06/2021 13:12  Beta-Hydroxybutyric Acid Latest Ref Range: 0.05 - 0.27 mmol/L 0.14  Glucose Latest Ref Range: 70 - 99 mg/dL [facility])  Results for Travis Lucas (MRN [id number]) as of 04/07/2021 06:39  Ref. Range 01/06/2021 12:57 04/06/2021 16:42  Hemoglobin A1C Latest Ref Range: 4.8 - 5.6 % 11.5 (H) 10.4 (H)   Review of Glycemic Control  Diabetes history: DM2 Outpatient Diabetes medications: Basaglar 48 units QHS, Novolog 22 units TID with meals Current orders for Inpatient glycemic control: IV insulin, Lantus 48 units QHS, Novolog 0-20 units TID with meals, Novolog 0-5 units QHS  Inpatient Diabetes Program Recommendations:    Insulin: Noted orders for SQ insulin with Lantus to start at bedtime today. Please consider changing frequency of Lantus to 48 units Q24H so that patient gets basal insulin now and IV insulin should be continued for 2 hours and then CBG checked and Novolog correction given if needed.  NOTE: In reviewing chart, noted patient was recently inpatient at [facility] 01/24/21-01/30/21 for DKA and was discharged on Lantus 48 units QHS and Novolog 22 units QID with meals. Patient admitted on 04/06/21 with hyperglycemia with HHS and per H&P patient reported he had not taken his insulin since Saturday due to nausea and poor intake. Current A1C 10.4% on 04/06/21;  noted A1C 13.4% on 01/24/21 in Care Everywhere. Will plan to talk with patient.  Addendum 04/07/21@13 :15-Spoke with patient at bedside regarding DM control. Patient asleep upon entering room and woke easily to his name. Patient kept eyes closed during most of conversation. Patient states that he goes to [facility] for primary care and gets medications [facility]. Patient reports he is taking Basaglar 48 units QHS and Novolog 22 units TID with meals for DM control. Patient states that his glucose is usually "good". Asked what type of numbers he sees on glucometer when he takes his insulin as prescribed and he stated "good" again.   Inquired about taking insulin over past few days and patient states he did not take any insulin because he was not able to eat. Discussed Basaglar/Lantus and Novolog insulin and how they work. Explained that he needs to be consistently taking basal insulin even when not able to eat. He needs to talk to his PCP about whether the dose needs to be adjusted at all for times when he is not able to eat. Informed patient that current A1C is 10.4% on 04/06/21 indicating an average glucose of 252 mg/dl over the past 2-3 months. Discussed glucose and A1C goals. Stressed importance of taking insulin consistently as prescribed to get DM under better control and decrease risk of complications from uncontrolled DM. Patient states that he has plenty of insulin and testing supplies at home. Patient reports that he has an upcoming appointment this month for follow  up at [facility]. Encouraged patient to talk with provider about guidelines on insulin dosages when he is NPO or unable to eat. Patient verbalized understanding of information discussed and states that he has no questions at this time.  Thanks, Orlando Penner, RN, MSN, CDE Diabetes Coordinator Inpatient Diabetes Program [phone] (Team Pager from 8am to 5pm)

## 2021-04-08 ENCOUNTER — Telehealth: Payer: Self-pay | Admitting: Cardiology

## 2021-04-08 LAB — URINE CULTURE: Culture: NO GROWTH

## 2021-04-08 LAB — CBC
HCT: 36.6 % — ABNORMAL LOW (ref 39.0–52.0)
Hemoglobin: 12.6 g/dL — ABNORMAL LOW (ref 13.0–17.0)
MCH: 30.1 pg (ref 26.0–34.0)
MCHC: 34.4 g/dL (ref 30.0–36.0)
MCV: 87.6 fL (ref 80.0–100.0)
Platelets: 193 10*3/uL (ref 150–400)
RBC: 4.18 MIL/uL — ABNORMAL LOW (ref 4.22–5.81)
RDW: 12.8 % (ref 11.5–15.5)
WBC: 6 10*3/uL (ref 4.0–10.5)
nRBC: 0 % (ref 0.0–0.2)

## 2021-04-08 LAB — BASIC METABOLIC PANEL
Anion gap: 10 (ref 5–15)
BUN: 34 mg/dL — ABNORMAL HIGH (ref 6–20)
CO2: 25 mmol/L (ref 22–32)
Calcium: 8.9 mg/dL (ref 8.9–10.3)
Chloride: 101 mmol/L (ref 98–111)
Creatinine, Ser: 0.91 mg/dL (ref 0.61–1.24)
GFR, Estimated: >60 mL/min (ref >60)
Glucose, Bld: 126 mg/dL — ABNORMAL HIGH (ref 70–99)
Potassium: 3.9 mmol/L (ref 3.5–5.1)
Sodium: 136 mmol/L (ref 135–145)

## 2021-04-08 LAB — GLUCOSE, CAPILLARY
Glucose-Capillary: 122 mg/dL — ABNORMAL HIGH (ref 70–99)
Glucose-Capillary: 155 mg/dL — ABNORMAL HIGH (ref 70–99)
Glucose-Capillary: 276 mg/dL — ABNORMAL HIGH (ref 70–99)
Glucose-Capillary: 73 mg/dL (ref 70–99)
Glucose-Capillary: 144 mg/dL — ABNORMAL HIGH (ref 70–99)
Glucose-Capillary: 105 mg/dL — ABNORMAL HIGH (ref 70–99)
Glucose-Capillary: 151 mg/dL — ABNORMAL HIGH (ref 70–99)

## 2021-04-08 MED ORDER — INSULIN ASPART 100 UNIT/ML IJ SOLN
4.0000 [IU] | Freq: Once | INTRAMUSCULAR | Status: AC
  Administered 2021-04-08: 4 [IU] via SUBCUTANEOUS
  Filled 2021-04-08: qty 1

## 2021-04-08 MED ORDER — ONDANSETRON HCL 4 MG/2ML IJ SOLN
4.0000 mg | Freq: Four times a day (QID) | INTRAMUSCULAR | Status: DC | PRN
  Administered 2021-04-08: 18:00:00 4 mg via INTRAVENOUS
  Filled 2021-04-08: qty 2

## 2021-04-08 NOTE — Progress Notes (Signed)
PROGRESS NOTE    Leul Narramore  HCW:[id number] DOB: 01-23-1971 DOA: 04/06/2021 PCP: Rolm Gala, NP  Brief Narrative: Strider Vallance is a 50 y.o. male with medical history significant for nonischemic cardiomyopathy, status post [employer] AICD placed on 08/2021, hypertension, hyperlipidemia, tobacco use, insulin-dependent diabetes type 1, presents emergency department for chief concerns of high sugar and nausea and vomiting. -In the ED, sodium was 125, bicarb was 21, creatinine was 5.7 blood blood glucose of 834, high-sensitivity troponin of 36 and lactic acid of 6.0  Assessment & Plan:    Hyperosmolar nonketotic hyperglycemia Uncontrolled type 2 diabetes mellitus Severe dehydration, lactic acidosis Nausea and vomiting - Patient has not taken his insulin since Saturday due to nausea and vomiting and no p.o. intake -Started on insulin drip via Endo tool in the ED, and given fluid boluses -Clinically improving, transitioned off IV insulin to Lantus, he is also on NovoLog 21 units 3 times daily AC, last hemoglobin's A1c in February was 13.4 -Continue NovoLog 8 units 3 times daily AC and Lantus 48 units daily -Had severe anorexia and limited/no p.o. intake all day yesterday -Hopefully p.o. intake will improve today  Elevated high-sensitivity troponin -Mild, with flat trend, do not suspect ACS  Acute kidney injury secondary to HHS -Severe, creatinine 5.7 on admission, trending down with hydration, normal today -Baseline creatinine is normal -Cozaar Lasix and Aldactone on hold -Discontinued IV fluids  Nonischemic cardiomyopathy -EF of 20-25% with grade 1 diastolic dysfunction based on echo in February -Clinically dry in the setting of HONK -Continue metoprolol, holding Lasix, Aldactone and Cozaar, restart soon -Discontinued IV fluids  DVT prophylaxis: Enoxaparin  Code Status: Full code Diet:  Diabetic Family Communication: No family at bedside, discussed with patient in  detail Status is:  inpatient because: Inpatient level of care appropriate due to severity of illness  Dispo: The patient is from: Home              Anticipated d/c is to: Home hopefully in 1 to 2 days              Patient currently is not medically stable to d/c.   Difficult to place patient No   Consultants:      Procedures:   Antimicrobials:    Subjective: -Feels better overall, nausea and vomiting has resolved, reports that his appetite is very poor, hardly ate anything all day yesterday, breathing is okay Objective: Vitals:   04/08/21 0010 04/08/21 0511 04/08/21 0737 04/08/21 1215  BP: 121/83 105/78 (!) 155/102 116/83  Pulse: (!) 101 96 (!) 103 89  Resp: 18 16 16 18   Temp: 98.3 F (36.8 C) 98.4 F (36.9 C) 98.3 F (36.8 C) 97.7 F (36.5 C)  TempSrc:      SpO2: 100% 100% 100% 100%  Weight:      Height:        Intake/Output Summary (Last 24 hours) at 04/08/2021 1216 Last data filed at 04/08/2021 1025 Gross per 24 hour  Intake 844.5 ml  Output 600 ml  Net 244.5 ml   Filed Weights   04/06/21 1308 04/06/21 1603  Weight: 74.7 kg 74.7 kg    Examination:  General exam: Pleasant male, laying in bed, AAOx3, no distress CVS: S1-S2, regular rate rhythm Lungs: Clear bilaterally Abdomen: Soft, nontender, bowel sounds present Extremities: No edema Skin: No rashes on exposed skin Psych: Appropriate mood and affect   Data Reviewed:   CBC: Recent Labs  Lab 04/06/21 1312 04/08/21 0343  WBC 10.2  6.0  NEUTROABS 8.8*  --   HGB 14.3 12.6*  HCT 43.1 36.6*  MCV 89.2 87.6  PLT 240 193   Basic Metabolic Panel: Recent Labs  Lab 04/06/21 1312 04/06/21 1642 04/06/21 2059 04/07/21 0014 04/07/21 0341 04/08/21 0343  NA 125* 128* 134* 134* 134* 136  K 4.8 3.9 3.3* 3.1* 3.4* 3.9  CL 79* 83* 90* 92* 94* 101  CO2 21* 25 27 25 28 25   GLUCOSE 834* 662* 200* 250* 181* 126*  BUN 55* 58* 61* 63* 61* 34*  CREATININE 5.75* 5.13* 4.52* 3.68* 2.98* 0.91  CALCIUM 9.8 9.5  9.7 9.2 9.1 8.9  MG 2.3  --   --   --   --   --    GFR: Estimated Creatinine Clearance: 104.9 mL/min (by C-G formula based on SCr of 0.91 mg/dL). Liver Function Tests: Recent Labs  Lab 04/06/21 1312  AST 34  ALT 39  ALKPHOS 96  BILITOT 0.9  PROT 9.4*  ALBUMIN 5.1*   Recent Labs  Lab 04/06/21 1642  LIPASE 31   No results for input(s): AMMONIA in the last 168 hours. Coagulation Profile: Recent Labs  Lab 04/06/21 1345  INR 1.1   Cardiac Enzymes: Recent Labs  Lab 04/06/21 1312  CKTOTAL 225   BNP (last 3 results) No results for input(s): PROBNP in the last 8760 hours. HbA1C: Recent Labs    04/06/21 1642  HGBA1C 10.4*   CBG: Recent Labs  Lab 04/07/21 1542 04/07/21 2046 04/08/21 0738 04/08/21 0910 04/08/21 1208  GLUCAP 158* 91 122* 155* 276*   Lipid Profile: No results for input(s): CHOL, HDL, LDLCALC, TRIG, CHOLHDL, LDLDIRECT in the last 72 hours. Thyroid Function Tests: No results for input(s): TSH, T4TOTAL, FREET4, T3FREE, THYROIDAB in the last 72 hours. Anemia Panel: No results for input(s): VITAMINB12, FOLATE, FERRITIN, TIBC, IRON, RETICCTPCT in the last 72 hours. Urine analysis:    Component Value Date/Time   COLORURINE YELLOW (A) 04/06/2021 1310   APPEARANCEUR CLOUDY (A) 04/06/2021 1310   APPEARANCEUR Clear 01/06/2021 1257   LABSPEC 1.020 04/06/2021 1310   PHURINE 5.0 04/06/2021 1310   GLUCOSEU >=500 (A) 04/06/2021 1310   HGBUR NEGATIVE 04/06/2021 1310   BILIRUBINUR NEGATIVE 04/06/2021 1310   BILIRUBINUR Negative 01/06/2021 1257   KETONESUR 5 (A) 04/06/2021 1310   PROTEINUR 30 (A) 04/06/2021 1310   NITRITE NEGATIVE 04/06/2021 1310   LEUKOCYTESUR NEGATIVE 04/06/2021 1310   Sepsis Labs: @LABRCNTIP (procalcitonin:4,lacticidven:4)  ) Recent Results (from the past 240 hour(s))  Resp Panel by RT-PCR (Flu A&B, Covid) Nasopharyngeal Swab     Status: None   Collection Time: 04/06/21  1:09 PM   Specimen: Nasopharyngeal Swab; Nasopharyngeal(NP)  swabs in vial transport medium  Result Value Ref Range Status   SARS Coronavirus 2 by RT PCR NEGATIVE NEGATIVE Final    Comment: (NOTE) SARS-CoV-2 target nucleic acids are NOT DETECTED.  The SARS-CoV-2 RNA is generally detectable in upper respiratory specimens during the acute phase of infection. The lowest concentration of SARS-CoV-2 viral copies this assay can detect is 138 copies/mL. A negative result does not preclude SARS-Cov-2 infection and should not be used as the sole basis for treatment or other patient management decisions. A negative result may occur with  improper specimen collection/handling, submission of specimen other than nasopharyngeal swab, presence of viral mutation(s) within the areas targeted by this assay, and inadequate number of viral copies(<138 copies/mL). A negative result must be combined with clinical observations, patient history, and epidemiological information. The expected result is  Negative.  Fact Sheet for Patients:  BloggerCourse.com  Fact Sheet for Healthcare Providers:  SeriousBroker.it  This test is no t yet approved or cleared by the [place] FDA and  has been authorized for detection and/or diagnosis of SARS-CoV-2 by FDA under an Emergency Use Authorization (EUA). This EUA will remain  in effect (meaning this test can be used) for the duration of the COVID-19 declaration under Section 564(b)(1) of the Act, 21 U.S.C.section 360bbb-3(b)(1), unless the authorization is terminated  or revoked sooner.       Influenza A by PCR NEGATIVE NEGATIVE Final   Influenza B by PCR NEGATIVE NEGATIVE Final    Comment: (NOTE) The Xpert Xpress SARS-CoV-2/FLU/RSV plus assay is intended as an aid in the diagnosis of influenza from Nasopharyngeal swab specimens and should not be used as a sole basis for treatment. Nasal washings and aspirates are unacceptable for Xpert Xpress  SARS-CoV-2/FLU/RSV testing.  Fact Sheet for Patients: BloggerCourse.com  Fact Sheet for Healthcare Providers: SeriousBroker.it  This test is not yet approved or cleared by the [place] FDA and has been authorized for detection and/or diagnosis of SARS-CoV-2 by FDA under an Emergency Use Authorization (EUA). This EUA will remain in effect (meaning this test can be used) for the duration of the COVID-19 declaration under Section 564(b)(1) of the Act, 21 U.S.C. section 360bbb-3(b)(1), unless the authorization is terminated or revoked.  Performed at [facility], [address], [place], [place] [address]   Urine culture     Status: None   Collection Time: 04/06/21  2:21 PM   Specimen: In/Out Cath Urine  Result Value Ref Range Status   Specimen Description   Final    IN/OUT CATH URINE Performed at [facility], [address], [place], [place] [address]    Special Requests   Final    NONE Performed at [facility], [address] [place], [place] [address]    Culture   Final    NO GROWTH Performed at [facility], [address], [place], [place] [address]    Report Status 04/08/2021 FINAL  Final  Blood culture (routine single)     Status: None (Preliminary result)   Collection Time: 04/06/21  2:40 PM   Specimen: BLOOD  Result Value Ref Range Status   Specimen Description BLOOD BLOOD RIGHT ARM  Final   Special Requests   Final    BOTTLES DRAWN AEROBIC AND ANAEROBIC Blood Culture adequate volume   Culture   Final    NO GROWTH 2 DAYS Performed at [facility], [address] [place], [place] [address]    Report Status PENDING  Incomplete  MRSA PCR Screening     Status: None   Collection Time: 04/06/21 10:02 PM   Specimen: Nasopharyngeal  Result Value Ref Range Status   MRSA by PCR NEGATIVE NEGATIVE Final    Comment:        The GeneXpert MRSA Assay (FDA approved for  NASAL specimens only), is one component of a comprehensive MRSA colonization surveillance program. It is not intended to diagnose MRSA infection nor to guide or monitor treatment for MRSA infections. Performed at [facility], [address], [place], [place] [address]          Radiology Studies: CT Head Wo Contrast  Result Date: 04/06/2021 CLINICAL DATA:  Minor head trauma with normal mental status. EXAM: CT HEAD WITHOUT CONTRAST CT CERVICAL SPINE WITHOUT CONTRAST TECHNIQUE: Multidetector CT imaging of the head  and cervical spine was performed following the standard protocol without intravenous contrast. Multiplanar CT image reconstructions of the cervical spine were also generated. COMPARISON:  None. FINDINGS: CT HEAD FINDINGS Brain: There is no evidence for acute hemorrhage, hydrocephalus, mass lesion, or abnormal extra-axial fluid collection. No definite CT evidence for acute infarction. Vascular: No hyperdense vessel or unexpected calcification. Skull: No evidence for fracture. No worrisome lytic or sclerotic lesion. Sinuses/Orbits: The visualized paranasal sinuses and mastoid air cells are clear. Visualized portions of the globes and intraorbital fat are unremarkable. Other: None. CT CERVICAL SPINE FINDINGS Alignment: Straightening of normal cervical lordosis evident. Skull base and vertebrae: No acute fracture. No primary bone lesion or focal pathologic process. Soft tissues and spinal canal: No prevertebral fluid or swelling. No visible canal hematoma. Disc levels: Mild degenerative changes at C6-7. Otherwise unremarkable. Upper chest: Negative. Other: None. IMPRESSION: 1. Unremarkable CT evaluation of the brain. No acute intracranial abnormality. 2. No cervical spine fracture. 3. Loss of cervical lordosis. This can be related to patient positioning, muscle spasm or soft tissue injury. Electronically Signed   By: Kennith Center M.D.   On: 04/06/2021 14:45   CT Cervical Spine Wo  Contrast  Result Date: 04/06/2021 CLINICAL DATA:  Minor head trauma with normal mental status. EXAM: CT HEAD WITHOUT CONTRAST CT CERVICAL SPINE WITHOUT CONTRAST TECHNIQUE: Multidetector CT imaging of the head and cervical spine was performed following the standard protocol without intravenous contrast. Multiplanar CT image reconstructions of the cervical spine were also generated. COMPARISON:  None. FINDINGS: CT HEAD FINDINGS Brain: There is no evidence for acute hemorrhage, hydrocephalus, mass lesion, or abnormal extra-axial fluid collection. No definite CT evidence for acute infarction. Vascular: No hyperdense vessel or unexpected calcification. Skull: No evidence for fracture. No worrisome lytic or sclerotic lesion. Sinuses/Orbits: The visualized paranasal sinuses and mastoid air cells are clear. Visualized portions of the globes and intraorbital fat are unremarkable. Other: None. CT CERVICAL SPINE FINDINGS Alignment: Straightening of normal cervical lordosis evident. Skull base and vertebrae: No acute fracture. No primary bone lesion or focal pathologic process. Soft tissues and spinal canal: No prevertebral fluid or swelling. No visible canal hematoma. Disc levels: Mild degenerative changes at C6-7. Otherwise unremarkable. Upper chest: Negative. Other: None. IMPRESSION: 1. Unremarkable CT evaluation of the brain. No acute intracranial abnormality. 2. No cervical spine fracture. 3. Loss of cervical lordosis. This can be related to patient positioning, muscle spasm or soft tissue injury. Electronically Signed   By: Kennith Center M.D.   On: 04/06/2021 14:45   DG Chest Portable 1 View  Result Date: 04/06/2021 CLINICAL DATA:  Fall.  Hyperglycemia.  Nausea, vomiting. EXAM: PORTABLE CHEST 1 VIEW COMPARISON:  10/26/2020 FINDINGS: Patient's LEFT-sided transvenous pacemaker leads to the RIGHT atrium and RIGHT ventricle. Heart is normal in size. The lungs are free of focal consolidations and pleural effusions. No  pulmonary edema. No pneumothorax or acute displaced rib fractures. IMPRESSION: No active disease. Electronically Signed   By: Norva Pavlov M.D.   On: 04/06/2021 14:43   DG Hip Unilat W or Wo Pelvis 2-3 Views Right  Result Date: 04/06/2021 CLINICAL DATA:  Fall. EXAM: DG HIP (WITH OR WITHOUT PELVIS) 2-3V RIGHT COMPARISON:  None. FINDINGS: There is no evidence of hip fracture or dislocation. There is no evidence of arthropathy or other focal bone abnormality. IMPRESSION: Negative. Electronically Signed   By: Kennith Center M.D.   On: 04/06/2021 14:38        Scheduled Meds: . atorvastatin  80  mg Oral QHS  . Chlorhexidine Gluconate Cloth  6 each Topical Daily  . enoxaparin (LOVENOX) injection  40 mg Subcutaneous Q24H  . famotidine  20 mg Oral QHS  . gabapentin  300 mg Oral QHS  . insulin aspart  0-20 Units Subcutaneous TID WC  . insulin aspart  0-5 Units Subcutaneous QHS  . insulin aspart  8 Units Subcutaneous TID WC  . insulin glargine  48 Units Subcutaneous Daily  . lidocaine  1 patch Transdermal Q24H  . metoprolol succinate  100 mg Oral Daily  . mirtazapine  15 mg Oral QHS  . pneumococcal 23 valent vaccine  0.5 mL Intramuscular Tomorrow-1000  . vitamin B-12  1,000 mcg Oral Daily   Continuous Infusions:    LOS: 1 day    Time spent:  Zannie Cove, MD [facility]  04/08/2021, 12:16 PM

## 2021-04-08 NOTE — Telephone Encounter (Signed)
Patient is in ICU wanted to make office aware that he stopped taking Metoprolol  & Entresto, has no appetite. Please assist.

## 2021-04-09 ENCOUNTER — Other Ambulatory Visit: Payer: Self-pay

## 2021-04-09 LAB — CBC
HCT: 35.9 % — ABNORMAL LOW (ref 39.0–52.0)
Hemoglobin: 12.2 g/dL — ABNORMAL LOW (ref 13.0–17.0)
MCH: 30 pg (ref 26.0–34.0)
MCHC: 34 g/dL (ref 30.0–36.0)
MCV: 88.2 fL (ref 80.0–100.0)
Platelets: 186 10*3/uL (ref 150–400)
RBC: 4.07 MIL/uL — ABNORMAL LOW (ref 4.22–5.81)
RDW: 12.6 % (ref 11.5–15.5)
WBC: 4.8 10*3/uL (ref 4.0–10.5)
nRBC: 0 % (ref 0.0–0.2)

## 2021-04-09 LAB — COMPREHENSIVE METABOLIC PANEL
ALT: 29 U/L (ref 0–44)
AST: 38 U/L (ref 15–41)
Albumin: 4 g/dL (ref 3.5–5.0)
Alkaline Phosphatase: 72 U/L (ref 38–126)
Anion gap: 7 (ref 5–15)
BUN: 23 mg/dL — ABNORMAL HIGH (ref 6–20)
CO2: 28 mmol/L (ref 22–32)
Calcium: 8.8 mg/dL — ABNORMAL LOW (ref 8.9–10.3)
Chloride: 99 mmol/L (ref 98–111)
Creatinine, Ser: 0.95 mg/dL (ref 0.61–1.24)
GFR, Estimated: >60 mL/min (ref >60)
Glucose, Bld: 278 mg/dL — ABNORMAL HIGH (ref 70–99)
Potassium: 4.6 mmol/L (ref 3.5–5.1)
Sodium: 134 mmol/L — ABNORMAL LOW (ref 135–145)
Total Bilirubin: 0.5 mg/dL (ref 0.3–1.2)
Total Protein: 7.1 g/dL (ref 6.5–8.1)

## 2021-04-09 LAB — GLUCOSE, CAPILLARY
Glucose-Capillary: 233 mg/dL — ABNORMAL HIGH (ref 70–99)
Glucose-Capillary: 300 mg/dL — ABNORMAL HIGH (ref 70–99)

## 2021-04-09 MED ORDER — INSULIN ASPART 100 UNIT/ML FLEXPEN: PEN_INJECTOR | SUBCUTANEOUS | 3 refills | Status: DC

## 2021-04-09 MED ORDER — BASAGLAR KWIKPEN 100 UNIT/ML ~~LOC~~ SOPN: 48.0000 [IU] | PEN_INJECTOR | Freq: Every day | SUBCUTANEOUS | 0 refills | Status: DC

## 2021-04-09 NOTE — Discharge Summary (Signed)
Physician Discharge Summary  Travis Lucas JJO:[id number] DOB: [birth year] DOA: 04/06/2021  PCP: Langston Reusing, NP  Admit date: 04/06/2021 Discharge date: 04/09/2021  Time spent: 35 minutes  Recommendations for Outpatient Follow-up:  1. Endocrinology, patient has an appointment on 5/17, will need further titration of insulin dose and evaluation for continuous glucose monitoring 2. Cardiology Dr.Agbor-Etang in 2-3 weeks   Discharge Diagnoses:  Principal Problem:   Hyperosmolar hyperglycemic state (HHS) ([place]) Gastroenteritis Uncontrolled type 2 diabetes mellitus Severe dehydration Lactic acidosis Nausea and vomiting   Primary hypertension   Smoking   Peripheral neuropathy Nonischemic cardiomyopathy, EF of 20-25%  history of erectile dysfunction   Hyperglycemia due to type 1 diabetes mellitus ([place])   AKI (acute kidney injury) ([place])   Hyperosmolar hyponatremia   Discharge Condition: Stable  Diet recommendation: Low-sodium, heart healthy, diabetic  Filed Weights   04/06/21 1308 04/06/21 1603  Weight: 74.7 kg 74.7 kg    History of present illness:  Travis Lucas a 50 y.o.malewith medical history significant fornonischemic cardiomyopathy, status post [employer] AICD placed on 08/2021, hypertension, hyperlipidemia, tobacco use, insulin-dependent diabetes type 1, presents emergency department for chief concerns of high sugar and nausea and vomiting. -In the ED, sodium was 125, bicarb was 21, creatinine was 5.7 blood blood glucose of 834, high-sensitivity troponin of 36 and lactic acid of 6.0  Hospital Course:   Hyperosmolar nonketotic hyperglycemia Uncontrolled type 2 diabetes mellitus Severe dehydration, lactic acidosis Nausea and vomiting - He developed nausea and vomiting after eating some steak the day of admission, subsequently could not take his insulin for a few days and presented to the ED with very high blood sugars associated with nausea and vomiting, his  CBG was 834 without evidence of anion gap metabolic acidosis -Started on insulin drip via Endo tool in the ED, and given fluid boluses -Clinically improved, transitioned off IV insulin to Lantus, he is also on NovoLog 21 units 3 times daily AC, and Lantus 48 units daily based on recent discharge from [facility], now stable oral intake improving however has required much lower dosages of meal coverage NovoLog compared to his basal dose -Discharged home on NovoLog 10 to 15 units 3 times daily before meals and Lantus 48 units daily -He has an upcoming appointment with endocrinology in 4 days, may be a good candidate for continuous glucose monitoring  Elevated high-sensitivity troponin -Mild, with flat trend, no evidence of ACS  Acute kidney injury secondary to HHS -Severe, creatinine 5.7 on admission, normalized with hydration -Entresto and diuretics held initially, resumed at discharge -Discontinued IV fluids  Nonischemic cardiomyopathy -EF of 20-25% with grade 1 diastolic dysfunction based on echo in February -Diuretics, Entresto held in the setting of nausea vomiting and dehydration with AKI -Improved, diuretics and Entresto resumed at discharge, follow-up with [facility] in [facility]  Discharge Exam: Vitals:   04/08/21 2337 04/09/21 0528  BP: (!) 148/98 (!) 153/103  Pulse: 99 92  Resp: 16 16  Temp: 98.6 F (37 C) 97.6 F (36.4 C)  SpO2: 100% 100%    General: AAOx3 Cardiovascular: S1S2/RRR Respiratory: CTAB  Discharge Instructions   Discharge Instructions    Diet - low sodium heart healthy   Complete by: As directed    Diet Carb Modified   Complete by: As directed    Increase activity slowly   Complete by: As directed      Allergies as of 04/09/2021      Reactions   Penicillins  Medication List    TAKE these medications   atorvastatin 80 MG tablet Commonly known as: LIPITOR TAKE ONE TABLET BY MOUTH EVERY DAY   Basaglar KwikPen 100 UNIT/ML Inject 48  Units into the skin daily. What changed:   how much to take  how to take this  when to take this   blood glucose meter kit and supplies Kit Dispense based on patient and insurance preference. Use up to four times daily as directed. (FOR ICD-9 250.00, 250.01).   Entresto 97-103 MG Generic drug: sacubitril-valsartan TAKE ONE TABLET BY MOUTH 2 TIMES A DAY   furosemide 20 MG tablet Commonly known as: LASIX TAKE ONE TABLET BY MOUTH EVERY DAY   gabapentin 300 MG capsule Commonly known as: NEURONTIN TAKE ONE CAPSULE BY MOUTH AT BEDTIME   insulin aspart 100 UNIT/ML FlexPen Commonly known as: NOVOLOG 10-15 units TID with meals What changed: additional instructions   lidocaine 5 % Commonly known as: LIDODERM APPLY PATCH ONTO THE SKIN AS DIRECTED BY MD. LEAVE ON FOR UP TO 12 HOURS ONLY, THEN REMOVE AND DISCARD PATCH.   metoprolol succinate 100 MG 24 hr tablet Commonly known as: TOPROL-XL TAKE ONE TABLET BY MOUTH. TAKE WITH OR IMMEDIATELY FOLLOWING A MEAL   mirtazapine 15 MG tablet Commonly known as: REMERON TAKE ONE TABLET BY MOUTH ONCE DAILY AT BEDTIME FOR INSOMNIA OR ANXIETY.   Pen Needles 31G X 5 MM Misc 15 Units by Does not apply route 2 (two) times daily.   Comfort EZ Pen Needles 32G X 4 MM Misc Generic drug: Insulin Pen Needle USE WITH INSULIN 2 TIMES A DAY   Comfort EZ Pen Needles 32G X 4 MM Misc Generic drug: Insulin Pen Needle AS DIRECTED   Rightest GS550 Blood Glucose test strip Generic drug: glucose blood AS DIRECTED   spironolactone 25 MG tablet Commonly known as: ALDACTONE TAKE ONE TABLET BY MOUTH EVERY DAY   vitamin B-12 1000 MCG tablet Commonly known as: CYANOCOBALAMIN Take 1 tablet (1,000 mcg total) by mouth daily.   Vitamin D (Ergocalciferol) 1.25 MG (50000 UNIT) Caps capsule Commonly known as: DRISDOL TAKE ONE CAPSULE BY MOUTH ONCE EVERY WEEK ON THE SAME DAY      Allergies  Allergen Reactions  . Penicillins     Follow-up Information     Kate Sable, MD.   Specialties: Cardiology, Radiology Contact information: [place] [address] [phone]        Vickie Epley, MD.   Specialties: Cardiology, Radiology Contact information: [place] [address] [phone]                The results of significant diagnostics from this hospitalization (including imaging, microbiology, ancillary and laboratory) are listed below for reference.    Significant Diagnostic Studies: CT Head Wo Contrast  Result Date: 04/06/2021 CLINICAL DATA:  Minor head trauma with normal mental status. EXAM: CT HEAD WITHOUT CONTRAST CT CERVICAL SPINE WITHOUT CONTRAST TECHNIQUE: Multidetector CT imaging of the head and cervical spine was performed following the standard protocol without intravenous contrast. Multiplanar CT image reconstructions of the cervical spine were also generated. COMPARISON:  None. FINDINGS: CT HEAD FINDINGS Brain: There is no evidence for acute hemorrhage, hydrocephalus, mass lesion, or abnormal extra-axial fluid collection. No definite CT evidence for acute infarction. Vascular: No hyperdense vessel or unexpected calcification. Skull: No evidence for fracture. No worrisome lytic or sclerotic lesion. Sinuses/Orbits: The visualized paranasal sinuses and mastoid air cells are clear. Visualized  portions of the globes and intraorbital fat are unremarkable. Other: None. CT CERVICAL SPINE FINDINGS Alignment: Straightening of normal cervical lordosis evident. Skull base and vertebrae: No acute fracture. No primary bone lesion or focal pathologic process. Soft tissues and spinal canal: No prevertebral fluid or swelling. No visible canal hematoma. Disc levels: Mild degenerative changes at C6-7. Otherwise unremarkable. Upper chest: Negative. Other: None. IMPRESSION: 1. Unremarkable CT evaluation of the brain. No acute intracranial abnormality. 2. No cervical spine fracture. 3. Loss of  cervical lordosis. This can be related to patient positioning, muscle spasm or soft tissue injury. Electronically Signed   By: Misty Stanley M.D.   On: 04/06/2021 14:45   CT Cervical Spine Wo Contrast  Result Date: 04/06/2021 CLINICAL DATA:  Minor head trauma with normal mental status. EXAM: CT HEAD WITHOUT CONTRAST CT CERVICAL SPINE WITHOUT CONTRAST TECHNIQUE: Multidetector CT imaging of the head and cervical spine was performed following the standard protocol without intravenous contrast. Multiplanar CT image reconstructions of the cervical spine were also generated. COMPARISON:  None. FINDINGS: CT HEAD FINDINGS Brain: There is no evidence for acute hemorrhage, hydrocephalus, mass lesion, or abnormal extra-axial fluid collection. No definite CT evidence for acute infarction. Vascular: No hyperdense vessel or unexpected calcification. Skull: No evidence for fracture. No worrisome lytic or sclerotic lesion. Sinuses/Orbits: The visualized paranasal sinuses and mastoid air cells are clear. Visualized portions of the globes and intraorbital fat are unremarkable. Other: None. CT CERVICAL SPINE FINDINGS Alignment: Straightening of normal cervical lordosis evident. Skull base and vertebrae: No acute fracture. No primary bone lesion or focal pathologic process. Soft tissues and spinal canal: No prevertebral fluid or swelling. No visible canal hematoma. Disc levels: Mild degenerative changes at C6-7. Otherwise unremarkable. Upper chest: Negative. Other: None. IMPRESSION: 1. Unremarkable CT evaluation of the brain. No acute intracranial abnormality. 2. No cervical spine fracture. 3. Loss of cervical lordosis. This can be related to patient positioning, muscle spasm or soft tissue injury. Electronically Signed   By: Misty Stanley M.D.   On: 04/06/2021 14:45   DG Chest Portable 1 View  Result Date: 04/06/2021 CLINICAL DATA:  Fall.  Hyperglycemia.  Nausea, vomiting. EXAM: PORTABLE CHEST 1 VIEW COMPARISON:  10/26/2020  FINDINGS: Patient's LEFT-sided transvenous pacemaker leads to the RIGHT atrium and RIGHT ventricle. Heart is normal in size. The lungs are free of focal consolidations and pleural effusions. No pulmonary edema. No pneumothorax or acute displaced rib fractures. IMPRESSION: No active disease. Electronically Signed   By: Nolon Nations M.D.   On: 04/06/2021 14:43   DG Hip Unilat W or Wo Pelvis 2-3 Views Right  Result Date: 04/06/2021 CLINICAL DATA:  Fall. EXAM: DG HIP (WITH OR WITHOUT PELVIS) 2-3V RIGHT COMPARISON:  None. FINDINGS: There is no evidence of hip fracture or dislocation. There is no evidence of arthropathy or other focal bone abnormality. IMPRESSION: Negative. Electronically Signed   By: Misty Stanley M.D.   On: 04/06/2021 14:38    Microbiology: Recent Results (from the past 240 hour(s))  Resp Panel by RT-PCR (Flu A&B, Covid) Nasopharyngeal Swab     Status: None   Collection Time: 04/06/21  1:09 PM   Specimen: Nasopharyngeal Swab; Nasopharyngeal(NP) swabs in vial transport medium  Result Value Ref Range Status   SARS Coronavirus 2 by RT PCR NEGATIVE NEGATIVE Final    Comment: (NOTE) SARS-CoV-2 target nucleic acids are NOT DETECTED.  The SARS-CoV-2 RNA is generally detectable in upper respiratory specimens during the acute phase of infection. The lowest concentration of  SARS-CoV-2 viral copies this assay can detect is 138 copies/mL. A negative result does not preclude SARS-Cov-2 infection and should not be used as the sole basis for treatment or other patient management decisions. A negative result may occur with  improper specimen collection/handling, submission of specimen other than nasopharyngeal swab, presence of viral mutation(s) within the areas targeted by this assay, and inadequate number of viral copies(<138 copies/mL). A negative result must be combined with clinical observations, patient history, and epidemiological information. The expected result is  Negative.  Fact Sheet for Patients:  EntrepreneurPulse.com.au  Fact Sheet for Healthcare Providers:  IncredibleEmployment.be  This test is no t yet approved or cleared by the [place] FDA and  has been authorized for detection and/or diagnosis of SARS-CoV-2 by FDA under an Emergency Use Authorization (EUA). This EUA will remain  in effect (meaning this test can be used) for the duration of the COVID-19 declaration under Section 564(b)(1) of the Act, 21 U.S.C.section 360bbb-3(b)(1), unless the authorization is terminated  or revoked sooner.       Influenza A by PCR NEGATIVE NEGATIVE Final   Influenza B by PCR NEGATIVE NEGATIVE Final    Comment: (NOTE) The Xpert Xpress SARS-CoV-2/FLU/RSV plus assay is intended as an aid in the diagnosis of influenza from Nasopharyngeal swab specimens and should not be used as a sole basis for treatment. Nasal washings and aspirates are unacceptable for Xpert Xpress SARS-CoV-2/FLU/RSV testing.  Fact Sheet for Patients: EntrepreneurPulse.com.au  Fact Sheet for Healthcare Providers: IncredibleEmployment.be  This test is not yet approved or cleared by the [place] FDA and has been authorized for detection and/or diagnosis of SARS-CoV-2 by FDA under an Emergency Use Authorization (EUA). This EUA will remain in effect (meaning this test can be used) for the duration of the COVID-19 declaration under Section 564(b)(1) of the Act, 21 U.S.C. section 360bbb-3(b)(1), unless the authorization is terminated or revoked.  Performed at [facility], [address] [place], [place] [address]   Urine culture     Status: None   Collection Time: 04/06/21  2:21 PM   Specimen: In/Out Cath Urine  Result Value Ref Range Status   Specimen Description   Final    IN/OUT CATH URINE Performed at [facility], [address], [place], [place] [address]    Special  Requests   Final    NONE Performed at [facility], [address], [place], [place] [address]    Culture   Final    NO GROWTH Performed at [facility], [place] [address] [place], [place] [address]    Report Status 04/08/2021 FINAL  Final  Blood culture (routine single)     Status: None (Preliminary result)   Collection Time: 04/06/21  2:40 PM   Specimen: BLOOD  Result Value Ref Range Status   Specimen Description BLOOD BLOOD RIGHT ARM  Final   Special Requests   Final    BOTTLES DRAWN AEROBIC AND ANAEROBIC Blood Culture adequate volume   Culture   Final    NO GROWTH 3 DAYS Performed at [facility], [address], [place], [place] [address]    Report Status PENDING  Incomplete  MRSA PCR Screening     Status: None   Collection Time: 04/06/21 10:02 PM   Specimen: Nasopharyngeal  Result Value Ref Range Status   MRSA by PCR NEGATIVE NEGATIVE Final    Comment:        The GeneXpert MRSA Assay (FDA approved for NASAL  specimens only), is one component of a comprehensive MRSA colonization surveillance program. It is not intended to diagnose MRSA infection nor to guide or monitor treatment for MRSA infections. Performed at [facility], [address] [place], [place] [address]      Labs: Basic Metabolic Panel: Recent Labs  Lab 04/06/21 1312 04/06/21 1642 04/06/21 2059 04/07/21 0014 04/07/21 0341 04/08/21 0343 04/09/21 0738  NA 125*   < > 134* 134* 134* 136 134*  K 4.8   < > 3.3* 3.1* 3.4* 3.9 4.6  CL 79*   < > 90* 92* 94* 101 99  CO2 21*   < > _0 GLUCOSE 834*   < > 200* 250* 181* 126* 278*  BUN 55*   < > 61* 63* 61* 34* 23*  CREATININE 5.75*   < > 4.52* 3.68* 2.98* 0.91 0.95  CALCIUM 9.8   < > 9.7 9.2 9.1 8.9 8.8*  MG 2.3  --   --   --   --   --   --    < > = values in this interval not displayed.   Liver Function Tests: Recent Labs  Lab 04/06/21 1312 04/09/21 0738  AST 34 38  ALT 39 29  ALKPHOS 96 72   BILITOT 0.9 0.5  PROT 9.4* 7.1  ALBUMIN 5.1* 4.0   Recent Labs  Lab 04/06/21 1642  LIPASE 31   No results for input(s): AMMONIA in the last 168 hours. CBC: Recent Labs  Lab 04/06/21 1312 04/08/21 0343 04/09/21 0738  WBC 10.2 6.0 4.8  NEUTROABS 8.8*  --   --   HGB 14.3 12.6* 12.2*  HCT 43.1 36.6* 35.9*  MCV 89.2 87.6 88.2  PLT 240 193 186   Cardiac Enzymes: Recent Labs  Lab 04/06/21 1312  CKTOTAL 225   BNP: BNP (last 3 results) Recent Labs    10/26/20 1600 04/06/21 1312  BNP 1,396.1* 11.0    ProBNP (last 3 results) No results for input(s): PROBNP in the last 8760 hours.  CBG: Recent Labs  Lab 04/08/21 1756 04/08/21 2047 04/08/21 2219 04/09/21 0340 04/09/21 0917  GLUCAP 144* 105* 151* 233* 300*       Signed:  Domenic Polite MD.  [facility] 04/09/2021, 12:08 PM

## 2021-04-09 NOTE — Progress Notes (Signed)
Request for prayer for someone to talk with. "Travis Lucas" shared he has been crying all day. He shared a life review and names his struggle with finding healthy balance in life. During visit this support counselor called and they arranged a visit next week. "Travis Lucas" shared a faith rooted in a punitive God. "Travis Lucas" believes he is having to pay for his sins. Chaplain sought to help "Travis Lucas" see his goodness and the ways he was sharing love in the world. "Travis Lucas" reports living alone, having relationships with family and friends that are geographically challenged. He reports family recognizes he may need to be checked on more. Chaplain understands that he lives a mother-in-law suite at his sisters home. Chaplain offered a blessing to help "Travis Lucas" breath deeper and slow down his thoughts. "Travis Lucas" would benefit from finding a congregation to connect with however transportation is a challenge.

## 2021-04-11 LAB — CULTURE, BLOOD (SINGLE)
Culture: NO GROWTH
Special Requests: ADEQUATE

## 2021-04-12 NOTE — Telephone Encounter (Signed)
Patient has been discharged and medications have been resumed.

## 2021-04-13 ENCOUNTER — Ambulatory Visit: Payer: Medicaid Other

## 2021-04-14 ENCOUNTER — Ambulatory Visit (INDEPENDENT_AMBULATORY_CARE_PROVIDER_SITE_OTHER): Payer: Self-pay

## 2021-04-14 ENCOUNTER — Ambulatory Visit: Payer: Medicaid Other | Admitting: Gerontology

## 2021-04-14 ENCOUNTER — Other Ambulatory Visit: Payer: Self-pay

## 2021-04-14 DIAGNOSIS — Z95810 Presence of automatic (implantable) cardiac defibrillator: Secondary | ICD-10-CM

## 2021-04-14 DIAGNOSIS — I428 Other cardiomyopathies: Secondary | ICD-10-CM

## 2021-04-14 DIAGNOSIS — I5022 Chronic systolic (congestive) heart failure: Secondary | ICD-10-CM

## 2021-04-15 ENCOUNTER — Other Ambulatory Visit: Payer: Self-pay

## 2021-04-15 ENCOUNTER — Other Ambulatory Visit: Payer: Self-pay | Admitting: Adult Health

## 2021-04-15 ENCOUNTER — Ambulatory Visit: Payer: Medicaid Other | Admitting: Gerontology

## 2021-04-15 VITALS — BP 105/70 | HR 103 | Temp 98.1°F | Resp 16 | Ht 74.0 in | Wt 191.5 lb

## 2021-04-15 DIAGNOSIS — Z8639 Personal history of other endocrine, nutritional and metabolic disease: Secondary | ICD-10-CM

## 2021-04-15 DIAGNOSIS — E1165 Type 2 diabetes mellitus with hyperglycemia: Secondary | ICD-10-CM

## 2021-04-15 DIAGNOSIS — G629 Polyneuropathy, unspecified: Secondary | ICD-10-CM

## 2021-04-15 DIAGNOSIS — E119 Type 2 diabetes mellitus without complications: Secondary | ICD-10-CM

## 2021-04-15 DIAGNOSIS — Z0000 Encounter for general adult medical examination without abnormal findings: Secondary | ICD-10-CM

## 2021-04-15 LAB — CUP PACEART REMOTE DEVICE CHECK
Battery Remaining Longevity: 88 mo
Battery Remaining Percentage: 81 %
Battery Voltage: 2.98 V
Brady Statistic AP VP Percent: 1 %
Brady Statistic AP VS Percent: 1 %
Brady Statistic AS VP Percent: 1 %
Brady Statistic AS VS Percent: 99 %
Brady Statistic RA Percent Paced: 1 %
Brady Statistic RV Percent Paced: 1 %
Date Time Interrogation Session: 20220518030709
HighPow Impedance: 66 Ohm
Implantable Lead Implant Date: 20201023
Implantable Lead Implant Date: 20211023
Implantable Lead Location: 753859
Implantable Lead Location: 753860
Implantable Pulse Generator Implant Date: 20201023
Lead Channel Impedance Value: 430 Ohm
Lead Channel Impedance Value: 440 Ohm
Lead Channel Pacing Threshold Amplitude: 0.875 V
Lead Channel Pacing Threshold Amplitude: 1 V
Lead Channel Pacing Threshold Pulse Width: 0.5 ms
Lead Channel Pacing Threshold Pulse Width: 0.5 ms
Lead Channel Sensing Intrinsic Amplitude: 12 mV
Lead Channel Sensing Intrinsic Amplitude: 5 mV
Lead Channel Setting Pacing Amplitude: 1.125
Lead Channel Setting Pacing Amplitude: 2 V
Lead Channel Setting Pacing Pulse Width: 0.5 ms
Lead Channel Setting Sensing Sensitivity: 0.5 mV
Pulse Gen Serial Number: 111007884

## 2021-04-15 LAB — GLUCOSE, POCT (MANUAL RESULT ENTRY): POC Glucose: 82 mg/dl (ref 70–99)

## 2021-04-15 MED ORDER — GABAPENTIN 300 MG PO CAPS
600.0000 mg | ORAL_CAPSULE | Freq: Every day | ORAL | 2 refills | Status: DC
  Filled 2021-04-15 – 2021-05-03 (×2): qty 60, 30d supply, fill #0
  Filled 2021-05-10 – 2021-06-16 (×2): qty 60, 30d supply, fill #1
  Filled 2021-07-20: qty 60, 30d supply, fill #2

## 2021-04-15 MED FILL — Glucose Blood Test Strip: 25 days supply | Qty: 100 | Fill #0 | Status: AC

## 2021-04-15 MED FILL — Sacubitril-Valsartan Tab 97-103 MG: ORAL | 30 days supply | Qty: 60 | Fill #0 | Status: CN

## 2021-04-15 NOTE — Patient Instructions (Signed)
Bradley and Daroff's neurology in clinical practice (8th ed., pp. 1853- 1929). Elsevier."> Goldman-Cecil medicine (26th ed., pp. 2489- 2501). Elsevier.">  Peripheral Neuropathy Peripheral neuropathy is a type of nerve damage. It affects nerves that carry signals between the spinal cord and the arms, legs, and the rest of the body (peripheral nerves). It does not affect nerves in the spinal cord or brain. In peripheral neuropathy, one nerve or a group of nerves may be damaged. Peripheral neuropathy is a broad category that includes many specific nerve disorders, like diabetic neuropathy, hereditary neuropathy, and carpal tunnel syndrome. What are the causes? This condition may be caused by:  Diabetes. This is the most common cause of peripheral neuropathy.  Nerve injury.  Pressure or stress on a nerve that lasts a long time.  Lack (deficiency) of B vitamins. This can result from alcoholism, poor diet, or a restricted diet.  Infections.  Autoimmune diseases, such as rheumatoid arthritis and systemic lupus erythematosus.  Nerve diseases that are passed from parent to child (inherited).  Some medicines, such as cancer medicines (chemotherapy).  Poisonous (toxic) substances, such as lead and mercury.  Too little blood flowing to the legs.  Kidney disease.  Thyroid disease. In some cases, the cause of this condition is not known. What are the signs or symptoms? Symptoms of this condition depend on which of your nerves is damaged. Common symptoms include:  Loss of feeling (numbness) in the feet, hands, or both.  Tingling in the feet, hands, or both.  Burning pain.  Very sensitive skin.  Weakness.  Not being able to move a part of the body (paralysis).  Muscle twitching.  Clumsiness or poor coordination.  Loss of balance.  Not being able to control your bladder.  Feeling dizzy.  Sexual problems. How is this diagnosed? Diagnosing and finding the cause of peripheral  neuropathy can be difficult. Your health care provider will take your medical history and do a physical exam. A neurological exam will also be done. This involves checking things that are affected by your brain, spinal cord, and nerves (nervous system). For example, your health care provider will check your reflexes, how you move, and what you can feel. You may have other tests, such as:  Blood tests.  Electromyogram (EMG) and nerve conduction tests. These tests check nerve function and how well the nerves are controlling the muscles.  Imaging tests, such as CT scans or MRI to rule out other causes of your symptoms.  Removing a small piece of nerve to be examined in a lab (nerve biopsy).  Removing and examining a small amount of the fluid that surrounds the brain and spinal cord (lumbar puncture). How is this treated? Treatment for this condition may involve:  Treating the underlying cause of the neuropathy, such as diabetes, kidney disease, or vitamin deficiencies.  Stopping medicines that can cause neuropathy, such as chemotherapy.  Medicine to help relieve pain. Medicines may include: ? Prescription or over-the-counter pain medicine. ? Antiseizure medicine. ? Antidepressants. ? Pain-relieving patches that are applied to painful areas of skin.  Surgery to relieve pressure on a nerve or to destroy a nerve that is causing pain.  Physical therapy to help improve movement and balance.  Devices to help you move around (assistive devices). Follow these instructions at home: Medicines  Take over-the-counter and prescription medicines only as told by your health care provider. Do not take any other medicines without first asking your health care provider.  Do not drive or use heavy   machinery while taking prescription pain medicine. Lifestyle  Do not use any products that contain nicotine or tobacco, such as cigarettes and e-cigarettes. Smoking keeps blood from reaching damaged nerves.  If you need help quitting, ask your health care provider.  Avoid or limit alcohol. Too much alcohol can cause a vitamin B deficiency, and vitamin B is needed for healthy nerves.  Eat a healthy diet. This includes: ? Eating foods that are high in fiber, such as fresh fruits and vegetables, whole grains, and beans. ? Limiting foods that are high in fat and processed sugars, such as fried or sweet foods.   General instructions  If you have diabetes, work closely with your health care provider to keep your blood sugar under control.  If you have numbness in your feet: ? Check every day for signs of injury or infection. Watch for redness, warmth, and swelling. ? Wear padded socks and comfortable shoes. These help protect your feet.  Develop a good support system. Living with peripheral neuropathy can be stressful. Consider talking with a mental health specialist or joining a support group.  Use assistive devices and attend physical therapy as told by your health care provider. This may include using a walker or a cane.  Keep all follow-up visits as told by your health care provider. This is important.   Contact a health care provider if:  You have new signs or symptoms of peripheral neuropathy.  You are struggling emotionally from dealing with peripheral neuropathy.  Your pain is not well-controlled. Get help right away if:  You have an injury or infection that is not healing normally.  You develop new weakness in an arm or leg.  You have fallen or do so frequently. Summary  Peripheral neuropathy is when the nerves in the arms, or legs are damaged, resulting in numbness, weakness, or pain.  There are many causes of peripheral neuropathy, including diabetes, pinched nerves, vitamin deficiencies, autoimmune disease, and hereditary conditions.  Diagnosing and finding the cause of peripheral neuropathy can be difficult. Your health care provider will take your medical history, do a  physical exam, and do tests, including blood tests and nerve function tests.  Treatment involves treating the underlying cause of the neuropathy and taking medicines to help control pain. Physical therapy and assistive devices may also help. This information is not intended to replace advice given to you by your health care provider. Make sure you discuss any questions you have with your health care provider. Document Revised: 08/25/2020 Document Reviewed: 08/25/2020 Elsevier Patient Education  2021 Elsevier Inc. Diabetes Mellitus Action Plan Following a diabetes action plan is a way for you to manage your diabetes (diabetes mellitus) symptoms. The plan is color-coded to help you understand what actions you need to take based on any symptoms you are having.  If you have symptoms in the red zone, you need medical care right away.  If you have symptoms in the yellow zone, you are having problems.  If you have symptoms in the green zone, you are doing well. Learning about and understanding diabetes can take time. Follow the plan that you develop with your health care provider. Know the target range for your blood sugar (glucose) level, and review your treatment plan with your health care provider at each visit. The target range for my blood sugar level is __________________________ mg/dL. Red zone Get medical help right away if you have any of the following symptoms:  A blood sugar test result that is below  54 mg/dL (3 mmol/L).  A blood sugar test result that is at or above 240 mg/dL (63.3 mmol/L) for 2 days in a row.  Confusion or trouble thinking clearly.  Difficulty breathing.  Sickness or a fever for 2 or more days that is not getting better.  Moderate or large ketone levels in your urine.  Feeling tired or having no energy. If you have any red zone symptoms, do not wait to see if the symptoms will go away. Get medical help right away. Call your local emergency services (911 in the  [place] Do not drive yourself to the hospital. If you have severely low blood sugar (severe hypoglycemia) and you cannot eat or drink, you may need glucagon. Make sure a family member or close friend knows how to check your blood sugar and how to give you glucagon. You may need to be treated in a hospital for this condition.   Yellow zone If you have any of the following symptoms, your diabetes is not under control and you may need to make some changes:  A blood sugar test result that is at or above 240 mg/dL (35.4 mmol/L) for 2 days in a row.  Blood sugar test results that are below 70 mg/dL (3.9 mmol/L).  Other symptoms of hypoglycemia, such as: ? Shaking or feeling light-headed. ? Confusion or irritability. ? Feeling hungry. ? Having a fast heartbeat. If you have any yellow zone symptoms:  Treat your hypoglycemia by eating or drinking 15 grams of a rapid-acting carbohydrate. Follow the 15:15 rule: ? Take 15 grams of a rapid-acting carbohydrate, such as:  1 tube of glucose gel.  4 glucose pills.  4 oz (120 mL) of fruit juice.  4 oz (120 mL) of regular (not diet) soda. ? Check your blood sugar 15 minutes after you take the carbohydrate. ? If the repeat blood sugar test is still at or below 70 mg/dL (3.9 mmol/L), take 15 grams of a carbohydrate again. ? If your blood sugar does not increase above 70 mg/dL (3.9 mmol/L) after 3 tries, get medical help right away. ? After your blood sugar returns to normal, eat a meal or a snack within 1 hour.  Keep taking your daily medicines as told by your health care provider.  Check your blood sugar more often than you normally would. ? Write down your results. ? Call your health care provider if you have trouble keeping your blood sugar in your target range.   Green zone These signs mean you are doing well and you can continue what you are doing to manage your diabetes:  Your blood sugar is within your personal target range. For most people,  a blood sugar level before a meal (preprandial) should be 80-130 mg/dL (5.6-2.5 mmol/L).  You feel well, and you are able to do daily activities. If you are in the green zone, continue to manage your diabetes as told by your health care provider. To do this:  Eat a healthy diet.  Exercise regularly.  Check your blood sugar as told by your health care provider.  Take your medicines as told by your health care provider.   Where to find more information  American Diabetes Association (ADA): diabetes.org  Association of Diabetes Care & Education Specialists (ADCES): diabeteseducator.org Summary  Following a diabetes action plan is a way for you to manage your diabetes symptoms. The plan is color-coded to help you understand what actions you need to take based on any symptoms you are having.  Follow the plan that you develop with your health care provider. Make sure you know your personal target blood sugar level.  Review your treatment plan with your health care provider at each visit. This information is not intended to replace advice given to you by your health care provider. Make sure you discuss any questions you have with your health care provider. Document Revised: 05/21/2020 Document Reviewed: 05/21/2020 Elsevier Patient Education  2021 [employer].

## 2021-04-15 NOTE — Progress Notes (Signed)
[facility]   Progress Note: General Provider: Wolfgang Phoenix, NP  SUBJECTIVE:   Travis Lucas is a 50 y.o. male who  has a past medical history of BOOP (bronchiolitis obliterans with organizing pneumonia) ([place]), CHF (congestive heart failure) ([place]), Chronic back pain, Diabetes mellitus without complication ([place]), Hyperlipidemia, Hypertension, and Ischemic cardiomyopathy with implantable cardioverter-defibrillator (ICD).. Patient presents today for diabtes Follow-up The patient went to the ED on 04/06/21 after falling four times in two days. He reports that his blood glucose was over 800 at the ED. He denies dizziness or lightheadedness prior to his falls. According to the [facility] ED note, his peer counselor brought him to the hospital after finding him on the floor on 04/06/21. On arrival at the ED,he had tachycardia, hypertensive, and was extremely dehydrated  His labs were consistent with Hyperosmolar Hyperglycemic Syndrome(HHS), blood glucose was 834 mg/dl, acute kidney injury, and dehydration. He was admitted from 05/10 to 04/09/21 for insulin drip and maintenance of fluid balance. He was discharged from the hospital with NovoLog 10 to 15 units three times daily before meals and Lantus 48 units daily. He states that he checks his blood glucose 3-4 times a day, but he didn't bring a glucose log or remember the results. The only blood glucose he reports is that his fasting blood glucose from this morning 115 mg/dl. He denies blood glucose being below 70 or above 240. His blood glucose at the clinic was 81 mg/dl, and he reports that he took his evening dose of Novolog without checking his blood glucose but had eaten dinner before he came to his appointment.  He states that he takes Glargin 48 units and Novolog 15 units with meals and denies omitting his insulin. He denies hyper or hyperglycemia symptoms but reports peripheral neuropathy to bilateral feet. He  states that he was taking 800 mg Gabapentin for severe neuropathy and stopped it when his peripheral neuropathy improved. He endorses Gabapentin 300 mg has not been effective, and he wants the dose to be increased. Tachycardia 103 during this visit; he denies chest pain, shortness of breath, or palpitations. He saw cardiologist Dr. Garen Lah on 02/19/21 at [facility] on 02/19/21 for heart failure and medication titration. He was instructed to increase Entresto to 97/103 MG twice a day and to decrease his Lasix to 20 MG once daily. Furthermore he had EKG that shows normal sinus rhythm, first-degree AV block. He states that his blurry vision improved since his blood glucose is under control, but he doesn't remember the last time he saw an eye doctor. He states that since he is a veteran he plans to make appointment with ophthalmology at the [facility]. He follows a low carb/a low concentrated sweets diet and adheres to the medication regimen. He has no other complaints. Overall, he states that he's doing well and offers no further complaint. Review of Systems  Constitutional: Negative.   HENT: Negative.   Eyes: Positive for blurred vision (bilateral).  Respiratory: Negative.   Cardiovascular: Negative.   Gastrointestinal: Negative.   Genitourinary: Negative.   Musculoskeletal: Positive for joint pain (bilater knee for 30 years).  Skin: Negative.   Neurological: Negative.   Endo/Heme/Allergies: Negative.   Psychiatric/Behavioral: Negative.      OBJECTIVE: BP 105/70 (BP Location: Right Arm, Patient Position: Sitting, Cuff Size: Normal)   Pulse (!) 103   Temp 98.1 F (36.7 C)   Resp 16   Ht _0  (1.88 m)  Wt 191 lb 8 oz (86.9 kg)   SpO2 98%   BMI 24.59 kg/m   Wt Readings from Last 3 Encounters:  04/15/21 191 lb 8 oz (86.9 kg)  04/06/21 164 lb 10.9 oz (74.7 kg)  03/24/21 175 lb (79.4 kg)     Physical Exam Vitals reviewed.  HENT:     Head: Normocephalic.  Cardiovascular:     Rate  and Rhythm: Regular rhythm. Tachycardia present.     Heart sounds: Normal heart sounds.  Pulmonary:     Effort: Pulmonary effort is normal.     Breath sounds: Normal breath sounds.  Abdominal:     General: Abdomen is flat. Bowel sounds are normal.     Palpations: Abdomen is soft.  Musculoskeletal:        General: Normal range of motion.     Cervical back: Normal range of motion.  Skin:    General: Skin is warm and dry.  Neurological:     General: No focal deficit present.     Mental Status: He is alert and oriented to person, place, and time.  Psychiatric:        Mood and Affect: Mood normal.        Behavior: Behavior normal.        Thought Content: Thought content normal.     ASSESSMENT/PLAN:  1. History of vitamin D deficiency  - Vitamin D (25 hydroxy)  2. Peripheral polyneuropathy  - B12 and Folate Panel - gabapentin (NEURONTIN) 300 MG capsule; Take 2 capsules (600 mg total) by mouth at bedtime.  Dispense: 60 capsule; Refill: 2  3. Health care maintenance  - Comp Met (CMET)  5. Insulin-requiring or dependent type II diabetes mellitus ([place]) -No medication changes warranted at the present time - Your HgbA1c was 10.4% and your goal should be less than 7%.  -Advised to resume current treatment regime, advise to check blood glucose fasting and before administering insulin and to bring blood glucose log to the next appointment. Fasting blood glucose should be between 80 mg/dl to 125m/dl. -Advise the importance of recording blood glucose, and is a crucial part of keeping your diabetes under control. -He is advised to continue on low carb/a low concentrated sweets diet and exercise as tolerated.  He was advise to do daily foot checks.  - HgB A1c -POCT Glucose (CBG)  Return in about 2 weeks (around 04/29/2021), or if symptoms worsen or fail to improve, for DM, please bring blood glucose log to the next appointment to adjust insulin dose.    The patient was given clear  instructions to go to ER or return to medical center if symptoms do not improve, worsen or new problems develop. The patient verbalized understanding and agreed with plan of care.  Fay Bagg, ALe[place]

## 2021-04-16 ENCOUNTER — Other Ambulatory Visit: Payer: Self-pay

## 2021-04-16 LAB — COMPREHENSIVE METABOLIC PANEL
ALT: 29 IU/L (ref 0–44)
AST: 22 IU/L (ref 0–40)
Albumin/Globulin Ratio: 1.8 (ref 1.2–2.2)
Albumin: 3.8 g/dL — ABNORMAL LOW (ref 4.0–5.0)
Alkaline Phosphatase: 79 IU/L (ref 44–121)
BUN/Creatinine Ratio: 14 (ref 9–20)
BUN: 11 mg/dL (ref 6–24)
Bilirubin Total: 0.2 mg/dL (ref 0.0–1.2)
CO2: 22 mmol/L (ref 20–29)
Calcium: 8.9 mg/dL (ref 8.7–10.2)
Chloride: 106 mmol/L (ref 96–106)
Creatinine, Ser: 0.79 mg/dL (ref 0.76–1.27)
Globulin, Total: 2.1 g/dL (ref 1.5–4.5)
Glucose: 90 mg/dL (ref 65–99)
Potassium: 3.6 mmol/L (ref 3.5–5.2)
Sodium: 144 mmol/L (ref 134–144)
Total Protein: 5.9 g/dL — ABNORMAL LOW (ref 6.0–8.5)
eGFR: 110 mL/min/{1.73_m2} (ref >59)

## 2021-04-16 LAB — B12 AND FOLATE PANEL
Folate: 11.8 ng/mL (ref >3.0)
Vitamin B-12: 324 pg/mL (ref 232–1245)

## 2021-04-16 LAB — VITAMIN D 25 HYDROXY (VIT D DEFICIENCY, FRACTURES): Vit D, 25-Hydroxy: 31.6 ng/mL (ref 30.0–100.0)

## 2021-04-16 LAB — HEMOGLOBIN A1C
Est. average glucose Bld gHb Est-mCnc: 258 mg/dL
Hgb A1c MFr Bld: 10.6 % — ABNORMAL HIGH (ref 4.8–5.6)

## 2021-04-19 ENCOUNTER — Encounter: Payer: Self-pay | Admitting: *Deleted

## 2021-04-19 ENCOUNTER — Other Ambulatory Visit: Payer: Self-pay

## 2021-04-20 ENCOUNTER — Other Ambulatory Visit: Payer: Self-pay

## 2021-04-21 ENCOUNTER — Ambulatory Visit: Payer: Medicaid Other | Admitting: Urology

## 2021-04-28 ENCOUNTER — Ambulatory Visit: Payer: Medicaid Other | Admitting: Urology

## 2021-04-28 ENCOUNTER — Other Ambulatory Visit: Payer: Self-pay

## 2021-04-28 ENCOUNTER — Ambulatory Visit (INDEPENDENT_AMBULATORY_CARE_PROVIDER_SITE_OTHER): Payer: Self-pay | Admitting: Urology

## 2021-04-28 ENCOUNTER — Encounter: Payer: Self-pay | Admitting: Urology

## 2021-04-28 VITALS — BP 114/78 | HR 94 | Ht 74.0 in | Wt 180.0 lb

## 2021-04-28 DIAGNOSIS — N529 Male erectile dysfunction, unspecified: Secondary | ICD-10-CM

## 2021-04-28 MED ORDER — TADALAFIL 10 MG PO TABS
10.0000 mg | ORAL_TABLET | Freq: Every day | ORAL | 11 refills | Status: DC | PRN
  Filled 2021-04-28: qty 9, 30d supply, fill #0
  Filled 2021-05-10 – 2021-05-28 (×3): qty 9, 30d supply, fill #1

## 2021-04-28 NOTE — Patient Instructions (Signed)
Erectile Dysfunction Erectile dysfunction (ED) is the inability to get or keep an erection in order to have sexual intercourse. ED is considered a symptom of an underlying disorder and not considered a disease. Erectile dysfunction may include:  Inability to get an erection.  Lack of enough hardness of the erection to allow penetration.  Loss of the erection before sex is finished. What are the causes? This condition may be caused by:  Certain medicines, such as: ? Pain relievers. ? Antihistamines. ? Antidepressants. ? Blood pressure medicines. ? Water pills (diuretics). ? Ulcer medicines. ? Muscle relaxants. ? Drugs.  Excessive drinking.  Psychological causes, such as: ? Anxiety. ? Depression. ? Sadness. ? Exhaustion. ? Performance fear. ? Stress.  Physical causes, such as: ? Artery problems. This may include diabetes, smoking, liver disease, or atherosclerosis. ? High blood pressure. ? Hormonal problems, such as low testosterone. ? Obesity. ? Nerve problems. This may include back or pelvic injuries, diabetes mellitus, multiple sclerosis, or Parkinson's disease. What are the signs or symptoms? Symptoms of this condition include:  Inability to get an erection.  Lack of enough hardness of the erection to allow penetration.  Loss of the erection before sex is finished.  Normal erections at some times, but with frequent unsatisfactory episodes.  Low sexual satisfaction in either partner due to erection problems.  A curved penis occurring with erection. The curve may cause pain or the penis may be too curved to allow for intercourse.  Never having nighttime erections. How is this diagnosed? This condition is often diagnosed by:  Performing a physical exam to find other diseases or specific problems with the penis.  Asking you detailed questions about the problem.  Performing blood tests to check for diabetes mellitus or to measure hormone levels.  Performing  other tests to check for underlying health conditions.  Performing an ultrasound exam to check for scarring.  Performing a test to check blood flow to the penis.  Doing a sleep study at home to measure nighttime erections. How is this treated? This condition may be treated by:  Medicine taken by mouth to help you achieve an erection (oral medicine).  Hormone replacement therapy to replace low testosterone levels.  Medicine that is injected into the penis. Your health care provider may instruct you how to give yourself these injections at home.  Vacuum pump. This is a pump with a ring on it. The pump and ring are placed on the penis and used to create pressure that helps the penis become erect.  Penile implant surgery. In this procedure, you may receive: ? An inflatable implant. This consists of cylinders, a pump, and a reservoir. The cylinders can be inflated with a fluid that helps to create an erection, and they can be deflated after intercourse. ? A semi-rigid implant. This consists of two silicone rubber rods. The rods provide some rigidity. They are also flexible, so the penis can both curve downward in its normal position and become straight for sexual intercourse.  Blood vessel surgery, to improve blood flow to the penis. During this procedure, a blood vessel from a different part of the body is placed into the penis to allow blood to flow around (bypass) damaged or blocked blood vessels.  Lifestyle changes, such as exercising more, losing weight, and quitting smoking. Follow these instructions at home: Medicines  Take over-the-counter and prescription medicines only as told by your health care provider. Do not increase the dosage without first discussing it with your health care   provider.  If you are using self-injections, perform injections as directed by your health care provider. Make sure to avoid any veins that are on the surface of the penis. After giving an injection,  apply pressure to the injection site for 5 minutes.   General instructions  Exercise regularly, as directed by your health care provider. Work with your health care provider to lose weight, if needed.  Do not use any products that contain nicotine or tobacco, such as cigarettes and e-cigarettes. If you need help quitting, ask your health care provider.  Before using a vacuum pump, read the instructions that come with the pump and discuss any questions with your health care provider.  Keep all follow-up visits as told by your health care provider. This is important. Contact a health care provider if:  You feel nauseous.  You vomit. Get help right away if:  You are taking oral or injectable medicines and you have an erection that lasts longer than 4 hours. If your health care provider is unavailable, go to the nearest emergency room for evaluation. An erection that lasts much longer than 4 hours can result in permanent damage to your penis.  You have severe pain in your groin or abdomen.  You develop redness or severe swelling of your penis.  You have redness spreading up into your groin or lower abdomen.  You are unable to urinate.  You experience chest pain or a rapid heart beat (palpitations) after taking oral medicines. Summary  Erectile dysfunction (ED) is the inability to get or keep an erection during sexual intercourse. This problem can usually be treated successfully.  This condition is diagnosed based on a physical exam, your symptoms, and tests to determine the cause. Treatment varies depending on the cause and may include medicines, hormone therapy, surgery, or a vacuum pump.  You may need follow-up visits to make sure that you are using your medicines or devices correctly.  Get help right away if you are taking or injecting medicines and you have an erection that lasts longer than 4 hours. This information is not intended to replace advice given to you by your health  care provider. Make sure you discuss any questions you have with your health care provider. Document Revised: 07/31/2020 Document Reviewed: 07/31/2020 Elsevier Patient Education  2021 Elsevier Inc.  

## 2021-04-28 NOTE — Progress Notes (Signed)
   04/28/21 10:37 AM   Travis Lucas 22-Apr-1971 [id number]  CC: Erectile dysfunction  HPI: Mr. Travis Lucas is a very comorbid 50 year old male with CHF, cardiomyopathy, poorly controlled diabetes(hemoglobin A1c 10.6), hypertension, chronic pain, hyperlipidemia who presents with a long history of 4+ years of ED.  He previously has used Revatio in the past with good results.  He does not remember the dose.  He denies any family history of prostate cancer or urinary symptoms.  PMH: Past Medical History:  Diagnosis Date  . BOOP (bronchiolitis obliterans with organizing pneumonia) (HCC)   . CHF (congestive heart failure) (HCC)   . Chronic back pain   . Diabetes mellitus without complication (HCC)   . Hyperlipidemia   . Hypertension   . Ischemic cardiomyopathy with implantable cardioverter-defibrillator (ICD)     Surgical History: Past Surgical History:  Procedure Laterality Date  . CARDIAC DEFIBRILLATOR PLACEMENT  09/10/2019  . CHOLECYSTECTOMY      Family History: Family History  Problem Relation Age of Onset  . Hypertension Mother   . Diabetes Father   . Congestive Heart Failure Father   . Prostate cancer Neg Hx   . Bladder Cancer Neg Hx   . Kidney cancer Neg Hx     Social History:  reports that he has been smoking cigars. He has never used smokeless tobacco. He reports current alcohol use. He reports current drug use. Drug: Marijuana.  Physical Exam: BP 114/78   Pulse 94   Ht 6\' 2"  (1.88 m)   Wt 180 lb (81.6 kg)   BMI 23.11 kg/m    Constitutional:  Alert and oriented, No acute distress. Cardiovascular: No clubbing, cyanosis, or edema. Respiratory: Normal respiratory effort, no increased work of breathing. GI: Abdomen is soft, nontender, nondistended, no abdominal masses GU: Circumcised phallus with patent meatus, testicles 20 cc and descended bilaterally without masses  Assessment & Plan:   50 year old extremely comorbid male with ED likely secondary to his  cardiomyopathy, and poorly controlled diabetes, and hypertension medications.  We reviewed the AUA guidelines regarding evaluation and management of erectile dysfunction, as well as the role of testosterone.  We discussed the risks and benefits of PDE 5 inhibitors at length, and the risks and benefits of generic Cialis and Viagra.  He would like to try the Cialis first.  He would like to follow-up on an as-needed basis.  Trial of Cialis 10 to 20 mg on demand for ED He prefers as needed follow-up If persistent ED, consider testosterone check  53, MD 04/28/2021  [facility] [address], [address] [place], [place] [phone]

## 2021-04-29 ENCOUNTER — Other Ambulatory Visit: Payer: Self-pay

## 2021-04-29 ENCOUNTER — Ambulatory Visit: Payer: Medicaid Other

## 2021-05-03 ENCOUNTER — Other Ambulatory Visit: Payer: Self-pay | Admitting: Internal Medicine

## 2021-05-03 ENCOUNTER — Other Ambulatory Visit: Payer: Self-pay

## 2021-05-03 ENCOUNTER — Other Ambulatory Visit: Payer: Self-pay | Admitting: Cardiology

## 2021-05-03 ENCOUNTER — Other Ambulatory Visit: Payer: Self-pay | Admitting: Adult Health

## 2021-05-03 MED ORDER — LOSARTAN POTASSIUM 25 MG PO TABS
25.0000 mg | ORAL_TABLET | Freq: Every day | ORAL | 2 refills | Status: DC
  Filled 2021-05-03: qty 30, 30d supply, fill #0

## 2021-05-03 MED FILL — Furosemide Tab 20 MG: ORAL | 30 days supply | Qty: 30 | Fill #0 | Status: AC

## 2021-05-03 MED FILL — Glucose Blood Test Strip: 25 days supply | Qty: 100 | Fill #1 | Status: AC

## 2021-05-03 MED FILL — Metoprolol Succinate Tab ER 24HR 100 MG (Tartrate Equiv): ORAL | 30 days supply | Qty: 30 | Fill #0 | Status: AC

## 2021-05-03 MED FILL — Lidocaine Patch 5%: CUTANEOUS | 30 days supply | Qty: 30 | Fill #0 | Status: AC

## 2021-05-03 MED FILL — Sacubitril-Valsartan Tab 97-103 MG: ORAL | 30 days supply | Qty: 60 | Fill #0 | Status: CN

## 2021-05-04 ENCOUNTER — Other Ambulatory Visit: Payer: Self-pay

## 2021-05-05 ENCOUNTER — Other Ambulatory Visit: Payer: Self-pay | Admitting: Internal Medicine

## 2021-05-05 ENCOUNTER — Other Ambulatory Visit: Payer: Self-pay

## 2021-05-06 ENCOUNTER — Other Ambulatory Visit: Payer: Self-pay

## 2021-05-06 NOTE — Progress Notes (Signed)
Remote ICD transmission.   

## 2021-05-07 ENCOUNTER — Other Ambulatory Visit: Payer: Self-pay

## 2021-05-07 ENCOUNTER — Telehealth: Payer: Self-pay | Admitting: Cardiology

## 2021-05-07 MED ORDER — LOSARTAN POTASSIUM 25 MG PO TABS
25.0000 mg | ORAL_TABLET | Freq: Every day | ORAL | 2 refills | Status: DC
  Filled 2021-05-07: qty 30, 30d supply, fill #0

## 2021-05-07 MED ORDER — LOSARTAN POTASSIUM 25 MG PO TABS
25.0000 mg | ORAL_TABLET | Freq: Every day | ORAL | 0 refills | Status: DC
  Filled 2021-05-07 – 2021-05-10 (×2): qty 90, 90d supply, fill #0

## 2021-05-07 NOTE — Addendum Note (Signed)
Addended by: Festus Aloe on: 05/07/2021 12:27 PM   Modules accepted: Orders

## 2021-05-07 NOTE — Telephone Encounter (Signed)
*  STAT* If patient is at the pharmacy, call can be transferred to refill team.   1. Which medications need to be refilled? (please list name of each medication and dose if known) losartin 25mg    2. Which pharmacy/location (including street and city if local pharmacy) is medication to be sent to? [facility] [place]  3. Do they need a 30 day or 90 day supply? 90 day

## 2021-05-09 ENCOUNTER — Other Ambulatory Visit: Payer: Self-pay

## 2021-05-10 ENCOUNTER — Other Ambulatory Visit: Payer: Self-pay

## 2021-05-10 MED FILL — Sacubitril-Valsartan Tab 97-103 MG: ORAL | 30 days supply | Qty: 60 | Fill #0 | Status: CN

## 2021-05-12 ENCOUNTER — Other Ambulatory Visit: Payer: Self-pay

## 2021-05-12 ENCOUNTER — Other Ambulatory Visit: Payer: Medicaid Other

## 2021-05-13 ENCOUNTER — Encounter: Payer: Self-pay | Admitting: Gerontology

## 2021-05-13 ENCOUNTER — Ambulatory Visit: Payer: Medicaid Other | Admitting: Gerontology

## 2021-05-13 VITALS — BP 111/77 | HR 95 | Temp 97.5°F | Ht 74.0 in | Wt 185.2 lb

## 2021-05-18 ENCOUNTER — Ambulatory Visit: Payer: Medicaid Other

## 2021-05-19 ENCOUNTER — Other Ambulatory Visit: Payer: Self-pay

## 2021-05-19 MED FILL — Sacubitril-Valsartan Tab 97-103 MG: ORAL | 90 days supply | Qty: 180 | Fill #0 | Status: AC

## 2021-05-20 ENCOUNTER — Ambulatory Visit: Payer: Medicaid Other

## 2021-05-21 ENCOUNTER — Telehealth: Payer: Self-pay | Admitting: Cardiology

## 2021-05-21 NOTE — Telephone Encounter (Signed)
Patient calling to discuss generating income while waiting on Disability by doing hear failure research studies   Patient wants advise or referral to info on paid research programs he could apply to .

## 2021-05-26 ENCOUNTER — Other Ambulatory Visit: Payer: Self-pay | Admitting: Adult Health

## 2021-05-26 ENCOUNTER — Other Ambulatory Visit: Payer: Self-pay

## 2021-05-26 MED FILL — Furosemide Tab 20 MG: ORAL | 30 days supply | Qty: 30 | Fill #1 | Status: CN

## 2021-05-27 ENCOUNTER — Other Ambulatory Visit: Payer: Self-pay

## 2021-05-27 MED ORDER — MIRTAZAPINE 15 MG PO TABS
ORAL_TABLET | ORAL | 1 refills | Status: AC
  Filled 2021-05-27: qty 30, 30d supply, fill #0
  Filled 2021-06-28: qty 30, 30d supply, fill #1

## 2021-05-28 ENCOUNTER — Other Ambulatory Visit: Payer: Self-pay

## 2021-05-28 MED FILL — Furosemide Tab 20 MG: ORAL | 30 days supply | Qty: 30 | Fill #1 | Status: AC

## 2021-06-01 ENCOUNTER — Telehealth: Payer: Self-pay | Admitting: Pharmacist

## 2021-06-01 NOTE — Telephone Encounter (Signed)
06/01/2021 1:39:37 PM - Novolog Flexpen & tips faxed refill  -- [name] - Tuesday, June 01, 2021 1:38 PM --Faxed refill to [facility] for [employer] Inject 22 units 3 times a day (max 66 units) # 6 boxes & Novofine 32G tips # 4 boxes.

## 2021-06-02 ENCOUNTER — Other Ambulatory Visit: Payer: Self-pay | Admitting: Gerontology

## 2021-06-02 ENCOUNTER — Other Ambulatory Visit: Payer: Self-pay

## 2021-06-02 NOTE — Telephone Encounter (Signed)
Called and left a VM for patient to call back. 

## 2021-06-03 ENCOUNTER — Other Ambulatory Visit: Payer: Self-pay

## 2021-06-07 ENCOUNTER — Other Ambulatory Visit: Payer: Self-pay

## 2021-06-07 ENCOUNTER — Other Ambulatory Visit: Payer: Self-pay | Admitting: Gerontology

## 2021-06-07 DIAGNOSIS — E119 Type 2 diabetes mellitus without complications: Secondary | ICD-10-CM

## 2021-06-08 NOTE — Telephone Encounter (Signed)
Late Entry:  Called patient and informed him of the below documentation on 06/02/21.  Bensimhon, Bevelyn Buckles, MD  Picard-Tagnolli, Niasia Lanphear, RN Caller: Unspecified (2 weeks ago) I am not aware of any of these opportunities. Sorry.

## 2021-06-10 ENCOUNTER — Other Ambulatory Visit: Payer: Self-pay

## 2021-06-14 ENCOUNTER — Ambulatory Visit: Payer: Medicaid Other | Admitting: Family

## 2021-06-15 ENCOUNTER — Other Ambulatory Visit: Payer: Self-pay | Admitting: Gerontology

## 2021-06-15 ENCOUNTER — Ambulatory Visit: Payer: Medicaid Other

## 2021-06-15 ENCOUNTER — Other Ambulatory Visit: Payer: Self-pay

## 2021-06-15 DIAGNOSIS — E119 Type 2 diabetes mellitus without complications: Secondary | ICD-10-CM

## 2021-06-15 MED FILL — Insulin Aspart Soln Pen-injector 100 Unit/ML: SUBCUTANEOUS | Qty: 15 | Fill #0 | Status: CN

## 2021-06-15 NOTE — Progress Notes (Signed)
Patient ID: Travis Lucas, male    DOB: December 26, 1970, 50 y.o.   MRN: [id number]  HPI  Travis Lucas is a 50 y/o male with a history of DM, HTN, BOOP, hyperlipidemia, current tobacco use and chronic heart failure.   Echo report from 01/25/21 reviewed and showed an EF of 20-25% with mild Travis. Echo report from 10/27/20 reviewed and showed an EF of <20% along with moderate Travis and moderately elevated PA pressure.   Admitted 04/06/21 due to nausea/ vomiting and elevated glucose of 834 after not being able to take his insulin for a few days. Started on insulin drip and given fluids. Transitioned off insulin drip to [place] insulin. Entresto and diuretics initially held due to worsening creatinine but then resumed prior to discharge.    Discharged after 3 days. Admitted 01/24/21 due to hyperglycemia >900 and presumed pancreatitis. Given IVF and meds adjusted. Spironolactone, metoprolol and entresto held due to AKI/ hypotension. Insulin adjusted & glipizide stopped. Discharged after 6 days.    He presents today for a follow-up visit with a chief complaint of intermittent anxiety. He describes this as chronic in nature having been present for several  years. He has associated gradual weight gain along with this. He denies any difficulty sleeping, dizziness, fatigue, cough, shortness of breath, chest pain, pedal edema, palpitations or abdominal distention.   He recently resumed his entresto again last week.   Past Medical History:  Diagnosis Date   BOOP (bronchiolitis obliterans with organizing pneumonia) ([place])    CHF (congestive heart failure) ([place])    Chronic back pain    Diabetes mellitus without complication ([place])    Hyperlipidemia    Hypertension    Ischemic cardiomyopathy with implantable cardioverter-defibrillator (ICD)    Past Surgical History:  Procedure Laterality Date   CARDIAC DEFIBRILLATOR PLACEMENT  09/10/2019   CHOLECYSTECTOMY     Family History  Problem Relation Age of Onset   Hypertension  Mother    Diabetes Father    Congestive Heart Failure Father    Prostate cancer Neg Hx    Bladder Cancer Neg Hx    Kidney cancer Neg Hx    Social History   Tobacco Use   Smoking status: Some Days    Types: Cigars   Smokeless tobacco: Never   Tobacco comments:    2 cigars per day  Substance Use Topics   Alcohol use: Yes    Comment: 1 drink every 2 weeks   Allergies  Allergen Reactions   Penicillins     Skin peeled   Prior to Admission medications   Medication Sig Start Date End Date Taking? Authorizing Provider  atorvastatin (LIPITOR) 80 MG tablet TAKE ONE TABLET BY MOUTH EVERY DAY 03/17/21 07/08/21 Yes Iloabachie, Chioma E, NP  blood glucose meter kit and supplies KIT Dispense based on patient and insurance preference. Use up to four times daily as directed. (FOR ICD-9 250.00, 250.01). 01/06/21  Yes Tukov-Yual, Magdalene S, NP  COMFORT EZ PEN NEEDLES 32G X 4 MM MISC AS DIRECTED 02/23/21 02/23/22 Yes   furosemide (LASIX) 20 MG tablet TAKE ONE TABLET BY MOUTH EVERY DAY 02/19/21 02/19/22 Yes Agbor-Etang, Aaron Edelman, MD  gabapentin (NEURONTIN) 300 MG capsule Take 2 capsules (600 mg total) by mouth at bedtime. 04/15/21 04/15/22 Yes Abate, Desta A, NP  insulin aspart (NOVOLOG FLEXPEN) 100 UNIT/ML FlexPen INJECT 22 UNITS INTO THE SKIN 3 TIMES A DAY WITH MEALS 06/15/21  Yes Iloabachie, Chioma E, NP  Insulin Glargine (BASAGLAR KWIKPEN) 100 UNIT/ML Inject  48 Units into the skin daily. 04/09/21 04/09/22 Yes Domenic Polite, MD  Insulin Pen Needle (PEN NEEDLES) 31G X 5 MM MISC 15 Units by Does not apply route 2 (two) times daily. 11/02/20  Yes Lorella Nimrod, MD  Insulin Pen Needle 32G X 4 MM MISC USE WITH INSULIN 2 TIMES A DAY 11/02/20 11/02/21 Yes Lorella Nimrod, MD  lidocaine (LIDODERM) 5 % APPLY ONE PATCH ONTO THE SKIN AS DIRECTED BY MD. LEAVE ON FOR UP TO 12 HOURS ONLY, THEN REMOVE AND DISCARD PATCH. 02/10/21 10/27/21 Yes Iloabachie, Chioma E, NP  metoprolol succinate (TOPROL-XL) 100 MG 24 hr tablet TAKE ONE  TABLET BY MOUTH. TAKE WITH OR IMMEDIATELY FOLLOWING A MEAL 12/11/20 12/11/21 Yes Agbor-Etang, Aaron Edelman, MD  mirtazapine (REMERON) 15 MG tablet Take one tablet (15 mg) by mouth once daily at bedtime for insomnia or anxiety. 05/27/21  Yes   RIGHTEST GS550 BLOOD GLUCOSE test strip USE AS DIRECTED. 02/23/21 02/23/22 Yes   sacubitril-valsartan (ENTRESTO) 97-103 MG TAKE ONE TABLET BY MOUTH 2 TIMES A DAY 02/19/21 02/19/22 Yes Agbor-Etang, Aaron Edelman, MD  spironolactone (ALDACTONE) 25 MG tablet TAKE ONE TABLET BY MOUTH EVERY DAY 11/02/20 07/28/21 Yes Lorella Nimrod, MD  tadalafil (CIALIS) 10 MG tablet Take 1-2 tablets (10-20 mg total) by mouth daily as needed for erectile dysfunction. 04/28/21  Yes Billey Co, MD  vitamin B-12 (CYANOCOBALAMIN) 1000 MCG tablet Take 1 tablet (1,000 mcg total) by mouth daily. 01/06/21  Yes Tukov-Yual, Magdalene S, NP  Vitamin D, Ergocalciferol, (DRISDOL) 1.25 MG (50000 UNIT) CAPS capsule TAKE ONE CAPSULE BY MOUTH ONCE EVERY WEEK ON THE SAME DAY Patient not taking: No sig reported 01/06/21 01/06/22  Erlene Quan, NP  glipiZIDE (GLUCOTROL XL) 2.5 MG 24 hr tablet Take one tablet with lunch 01/06/21 02/04/21  Erlene Quan, NP   Review of Systems  Constitutional:  Negative for appetite change and fatigue.  HENT:  Negative for congestion, postnasal drip and sore throat.   Eyes: Negative.   Respiratory:  Negative for cough, chest tightness and shortness of breath.   Cardiovascular:  Negative for chest pain, palpitations and leg swelling.  Gastrointestinal:  Negative for abdominal distention and abdominal pain.  Endocrine: Negative.   Genitourinary: Negative.   Musculoskeletal:  Negative for back pain and neck pain.  Skin: Negative.   Allergic/Immunologic: Negative.   Neurological:  Positive for numbness (in hands/ feet). Negative for dizziness and light-headedness.  Hematological:  Negative for adenopathy. Does not bruise/bleed easily.  Psychiatric/Behavioral:  Negative for  dysphoric mood and sleep disturbance. The patient is nervous/anxious (at times).    Vitals:   06/16/21 1136  BP: 108/79  Pulse: (!) 103  Resp: 14  SpO2: 100%  Weight: 185 lb 4 oz (84 kg)  Height: 6' 2"  (1.88 m)   Wt Readings from Last 3 Encounters:  06/16/21 185 lb 4 oz (84 kg)  05/13/21 185 lb 3.2 oz (84 kg)  04/28/21 180 lb (81.6 kg)   Lab Results  Component Value Date   CREATININE 0.79 04/15/2021   CREATININE 0.95 04/09/2021   CREATININE 0.91 04/08/2021   Physical Exam Vitals and nursing note reviewed.  Constitutional:      Appearance: Normal appearance.  HENT:     Head: Normocephalic and atraumatic.  Cardiovascular:     Rate and Rhythm: Regular rhythm. Tachycardia present.  Pulmonary:     Effort: Pulmonary effort is normal. No respiratory distress.     Breath sounds: No wheezing or rales.  Abdominal:  General: There is no distension.     Palpations: Abdomen is soft.     Tenderness: There is no abdominal tenderness.  Musculoskeletal:        General: No tenderness.     Cervical back: Normal range of motion and neck supple.     Right lower leg: No edema.     Left lower leg: No edema.  Skin:    General: Skin is warm and dry.  Neurological:     General: No focal deficit present.     Mental Status: He is alert and oriented to person, place, and time.  Psychiatric:        Mood and Affect: Mood normal.        Behavior: Behavior normal.        Thought Content: Thought content normal.   Assessment & Plan:  1: Chronic heart failure with reduced ejection fraction- - NYHA class I - euvolemic today - weighing daily; reminded to call for an overnight weight gain of > 2 pounds or a weekly weight gain of >5 pounds - weight down 10 pounds from last visit here 3 months ago - not adding salt and has tried to follow a low sodium diet - saw cardiology ([place]) 02/19/21; has echo scheduled for 07/22/21 and office f/u on 07/26/21 - on GDMT of metoprolol succinate,  entresto and spironolactone - consider adding SGLT - has AICD (placed Oct 2020) - BNP 04/06/21 was 11.0  2: HTN- - BP looks good today (108/79( - saw PCP (Iloabachie) at [facility] on 05/13/21 - CMP 04/15/21 reviewed and showed sodium 144, potassium 3.6, creatinine 0.79 and GFR 110  3: DM- - A1c 04/15/21 was 10.6% - goes to endocrinology clinic on 06/22/21   Patient did not bring his medications nor a list. Each medication was verbally reviewed with the patient and he was encouraged to bring the bottles to every visit to confirm accuracy of list.   Due to HF stability, will not make a return appointment for patient at this time. Advised patient to follow closely with cardiology and that he could call back at anytime for any questions or to make another appointment. Patient was comfortable with this plan.

## 2021-06-16 ENCOUNTER — Ambulatory Visit: Payer: Medicaid Other | Attending: Family | Admitting: Family

## 2021-06-16 ENCOUNTER — Encounter: Payer: Self-pay | Admitting: Family

## 2021-06-16 ENCOUNTER — Other Ambulatory Visit: Payer: Self-pay

## 2021-06-16 VITALS — BP 108/79 | HR 103 | Resp 14 | Ht 74.0 in | Wt 185.2 lb

## 2021-06-16 DIAGNOSIS — I10 Essential (primary) hypertension: Secondary | ICD-10-CM

## 2021-06-16 DIAGNOSIS — E1165 Type 2 diabetes mellitus with hyperglycemia: Secondary | ICD-10-CM

## 2021-06-16 DIAGNOSIS — E119 Type 2 diabetes mellitus without complications: Secondary | ICD-10-CM | POA: Insufficient documentation

## 2021-06-16 DIAGNOSIS — I5022 Chronic systolic (congestive) heart failure: Secondary | ICD-10-CM

## 2021-06-16 DIAGNOSIS — Z79899 Other long term (current) drug therapy: Secondary | ICD-10-CM | POA: Insufficient documentation

## 2021-06-16 DIAGNOSIS — E785 Hyperlipidemia, unspecified: Secondary | ICD-10-CM | POA: Insufficient documentation

## 2021-06-16 DIAGNOSIS — I110 Hypertensive heart disease with heart failure: Secondary | ICD-10-CM | POA: Insufficient documentation

## 2021-06-16 DIAGNOSIS — Z794 Long term (current) use of insulin: Secondary | ICD-10-CM

## 2021-06-16 DIAGNOSIS — Z8249 Family history of ischemic heart disease and other diseases of the circulatory system: Secondary | ICD-10-CM | POA: Insufficient documentation

## 2021-06-16 DIAGNOSIS — F17290 Nicotine dependence, other tobacco product, uncomplicated: Secondary | ICD-10-CM | POA: Insufficient documentation

## 2021-06-16 NOTE — Patient Instructions (Addendum)
Continue weighing daily and call for an overnight weight gain of > 2 pounds or a weekly weight gain of >5 pounds.   Call us in the future if you have any questions or if you need to make another appointment  

## 2021-06-21 ENCOUNTER — Other Ambulatory Visit: Payer: Self-pay

## 2021-06-22 ENCOUNTER — Other Ambulatory Visit: Payer: Self-pay

## 2021-06-22 ENCOUNTER — Ambulatory Visit: Payer: Medicaid Other | Admitting: Endocrinology

## 2021-06-22 ENCOUNTER — Other Ambulatory Visit: Payer: Self-pay | Admitting: Gerontology

## 2021-06-22 VITALS — BP 108/76 | HR 102 | Wt 188.2 lb

## 2021-06-22 DIAGNOSIS — E119 Type 2 diabetes mellitus without complications: Secondary | ICD-10-CM

## 2021-06-22 DIAGNOSIS — G629 Polyneuropathy, unspecified: Secondary | ICD-10-CM

## 2021-06-22 DIAGNOSIS — Z794 Long term (current) use of insulin: Secondary | ICD-10-CM

## 2021-06-22 MED ORDER — NOVOLOG FLEXPEN 100 UNIT/ML ~~LOC~~ SOPN
PEN_INJECTOR | SUBCUTANEOUS | 3 refills | Status: DC
  Filled 2021-06-22: qty 60, 91d supply, fill #0
  Filled 2021-08-03 – 2021-09-27 (×2): qty 60, 91d supply, fill #1

## 2021-06-22 MED ORDER — EMPAGLIFLOZIN 10 MG PO TABS
10.0000 mg | ORAL_TABLET | Freq: Every day | ORAL | 4 refills | Status: DC
  Filled 2021-06-22: qty 90, 90d supply, fill #0

## 2021-06-22 NOTE — Patient Instructions (Addendum)
Start [name] when it comes in.  If you have blood sugars less than 100, cut insulin dose by 10% sequentially. This should help your heart.  If you develop foreskin infection, use over the counter cream for vaginal yeast infection. Check your feet every day

## 2021-06-22 NOTE — Progress Notes (Signed)
New Diabetes Consultation [facility]     Patient ID: Travis Lucas, male   DOB: 1971-02-05, 50 y.o.   MRN: [id number] Assessment/Plan  Travis Lucas is a 50 y.o. male who is seen in consultation for Insulin-requiring or dependent type II diabetes mellitus ([place]) [E11.9, Z79.4] at the request of Iloabachie, Chioma E, NP.  1. Insulin-requiring or dependent type II diabetes mellitus ([place])   2. Peripheral polyneuropathy   He is above goal with a HbA1c of 10.6 on 04/15/21, which is down from 11.5 on 2/9. He has not been monitoring his blood sugar due to the battery of his meter dying. We will order a new glycosometer to monitor his blood sugar regularly.To better control his diabetes, Vania Rea will be started and risks of balanitis and hypoglycemia were explained with signs to watch for with interventions for those risks. If blood sugars are less than 100, he should cut his insulin dose by 10% sequentially in both his long and short acting. For balanitis, he should use over the counter cream for vaginal yeast infections. He will be referred to opthomology. He will see him back in three months for a HbA1c and follow up from changing his medication regimen.      Patient Instructions  Start [name] when it comes in.  If you have blood sugars less than 100, cut insulin dose by 10% sequentially. This should help your heart.  If you develop foreskin infection, use over the counter cream for vaginal yeast infection. Check your feet every day   No orders of the defined types were placed in this encounter.   Subjective:  HPI  Travis Lucas is a 50 yo male, who has a past medical history of BOOP (bronchiolitis obliterans with organizing pneumonia) ([place]), CHF (congestive heart failure) ([place]), Chronic back pain, Diabetes mellitus without complication ([place]), Hyperlipidemia, Hypertension, and Ischemic cardiomyopathy with implantable cardioverter-defibrillator (ICD) presenting for diabetes care  management. He was originally diagnosed with diabetes in 2004 and has had a few weeks periods where he did not have insulin and felt "ok". He had a recent admission on 5/10 with a blood glucose up to 834 with a suspicion of HHS and had an AKI. The admission was precipitated by falls in four times in 3 days. He feels "loopy" after mirtazapine and gabapentin.   He is not checking his blood glucose regularly as his meter died. He checks his blood pressures every morning. He reports feeling thirsty and increased urination on Sunday after his mother's birthday party on Saturday. He has numbness in his R > L hand and has pain in his feet that decreased after his Gabapentin was doubled a few weeks ago.   His last opthalmology visit was in 2021 and he has blurry vision, has a hard time reading subtitles and having a hard time driving at night. He denies any symptoms of heart failure (chest pain, SOB, palpitations, dyspnea, edema, orthopnea).   He is fluid restricted at 2L. He checks his weight each morning, reporting 1.5 to 2lbs of weight gain each week and had weight gain from 155 to 187 pounds over 7 months.   He drinks 1-2 beers, socially and every two weeks. He smokes two cigars a day and smokes marijuana every day.  Review of Systems  Constitutional:  Negative for chills, fever and weight loss.  Gastrointestinal:  Negative for nausea and vomiting.  Skin:  Negative for rash.  Review of Systems  Constitutional:  Negative for chills, fever  and weight loss.  Gastrointestinal:  Negative for nausea and vomiting.  Skin:  Negative for rash.       Travis Lucas family history includes Congestive Heart Failure in his father; Diabetes in his father; Hypertension in his mother.   Current Outpatient Medications:  .  atorvastatin (LIPITOR) 80 MG tablet, TAKE ONE TABLET BY MOUTH EVERY DAY, Disp: 90 tablet, Rfl: 1 .  blood glucose meter kit and supplies KIT, Dispense based on patient and insurance  preference. Use up to four times daily as directed. (FOR ICD-9 250.00, 250.01)., Disp: 1 each, Rfl: 0 .  COMFORT EZ PEN NEEDLES 32G X 4 MM MISC, AS DIRECTED, Disp: 100 each, Rfl: 99 .  empagliflozin ([name]) 10 MG TABS tablet, Take 1 tablet (10 mg total) by mouth daily before breakfast., Disp: 90 tablet, Rfl: 4 .  furosemide (LASIX) 20 MG tablet, TAKE ONE TABLET BY MOUTH EVERY DAY, Disp: 30 tablet, Rfl: 3 .  gabapentin (NEURONTIN) 300 MG capsule, Take 2 capsules (600 mg total) by mouth at bedtime., Disp: 60 capsule, Rfl: 2 .  insulin aspart (NOVOLOG FLEXPEN) 100 UNIT/ML FlexPen, INJECT 22 UNITS INTO THE SKIN 3 TIMES A DAY WITH MEALS (Patient taking differently: 10-15 units), Disp: 90 mL, Rfl: 3 .  Insulin Glargine (BASAGLAR KWIKPEN) 100 UNIT/ML, Inject 48 Units into the skin daily., Disp: 5 mL, Rfl: 0 .  Insulin Pen Needle (PEN NEEDLES) 31G X 5 MM MISC, 15 Units by Does not apply route 2 (two) times daily., Disp: 100 each, Rfl: 6 .  Insulin Pen Needle 32G X 4 MM MISC, USE WITH INSULIN 2 TIMES A DAY, Disp: 100 each, Rfl: 6 .  lidocaine (LIDODERM) 5 %, APPLY ONE PATCH ONTO THE SKIN AS DIRECTED BY MD. LEAVE ON FOR UP TO 12 HOURS ONLY, THEN REMOVE AND DISCARD PATCH. (Patient taking differently: Uses PRN. Last use of patch 2 months ago), Disp: 30 patch, Rfl: 1 .  metoprolol succinate (TOPROL-XL) 100 MG 24 hr tablet, TAKE ONE TABLET BY MOUTH. TAKE WITH OR IMMEDIATELY FOLLOWING A MEAL, Disp: 30 tablet, Rfl: 5 .  mirtazapine (REMERON) 15 MG tablet, Take one tablet (15 mg) by mouth once daily at bedtime for insomnia or anxiety. (Patient taking differently: Take 15 mg by mouth. Taking 2 tablets (30 mg)), Disp: 30 tablet, Rfl: 1 .  RIGHTEST [id number] BLOOD GLUCOSE test strip, USE AS DIRECTED., Disp: 100 strip, Rfl: 99 .  sacubitril-valsartan (ENTRESTO) 97-103 MG, TAKE ONE TABLET BY MOUTH 2 TIMES A DAY, Disp: 60 tablet, Rfl: 5 .  spironolactone (ALDACTONE) 25 MG tablet, TAKE ONE TABLET BY MOUTH EVERY DAY, Disp: 90  tablet, Rfl: 1 .  tadalafil (CIALIS) 10 MG tablet, Take 1-2 tablets (10-20 mg total) by mouth daily as needed for erectile dysfunction., Disp: 30 tablet, Rfl: 11 .  vitamin B-12 (CYANOCOBALAMIN) 1000 MCG tablet, Take 1 tablet (1,000 mcg total) by mouth daily., Disp: 90 tablet, Rfl: 1 .  Vitamin D, Ergocalciferol, (DRISDOL) 1.25 MG (50000 UNIT) CAPS capsule, TAKE ONE CAPSULE BY MOUTH ONCE EVERY WEEK ON THE SAME DAY, Disp: 5 capsule, Rfl: 1  Allergies  Allergen Reactions  . Penicillins     Skin peeled   Objective:    Physical Exam Pulmonary: CTAB, no wheezes, no rhonchi  Cardiovascular: RRR, normal S1, S2, no m/r/g Extremities: no edema     Data/Results/Labs  : I have personally reviewed pertinent labs and imaging studies, if indicated,  with the patient in clinic today.  No results found  for this or any previous visit (from the past 672 hour(s)).]  HC Readings from Last 3 Encounters:  No data found for [facility]    Wt Readings from Last 3 Encounters:  06/22/21 188 lb 3 oz (85.4 kg)  06/16/21 185 lb 4 oz (84 kg)  05/13/21 185 lb 3.2 oz (84 kg)

## 2021-06-23 ENCOUNTER — Other Ambulatory Visit: Payer: Self-pay

## 2021-06-23 MED ORDER — RIGHTEST GL300 LANCETS MISC
99 refills | Status: AC
  Filled 2021-06-23: qty 100, 25d supply, fill #0
  Filled 2021-07-20: qty 100, 25d supply, fill #1

## 2021-06-23 MED FILL — Glucose Blood Test Strip: 25 days supply | Qty: 100 | Fill #2 | Status: AC

## 2021-06-24 ENCOUNTER — Telehealth: Payer: Self-pay | Admitting: Pharmacist

## 2021-06-24 ENCOUNTER — Telehealth: Payer: Self-pay

## 2021-06-24 ENCOUNTER — Other Ambulatory Visit: Payer: Self-pay | Admitting: Gerontology

## 2021-06-24 DIAGNOSIS — E119 Type 2 diabetes mellitus without complications: Secondary | ICD-10-CM

## 2021-06-24 DIAGNOSIS — N529 Male erectile dysfunction, unspecified: Secondary | ICD-10-CM

## 2021-06-24 DIAGNOSIS — Z794 Long term (current) use of insulin: Secondary | ICD-10-CM

## 2021-06-24 NOTE — Telephone Encounter (Signed)
Incoming message on triage line from pt stating her would like a call back about medication.   Called pt he states that he would like a new RX sent in for tadalafil with an increased quantity. He states that he has to take 20mg  each time in order to have intercourse. Ok to send in? Please advise.

## 2021-06-24 NOTE — Telephone Encounter (Signed)
06/24/2021 10:18:21 AM - [name] to pat. & dr  -- [name] - Thursday, June 24, 2021 10:16 AM --Received pharmacy printout for [name] 10mg  Take one tablet by mouth daily before breakfast. Printed [employer] application-Mailing to patient to sign and return, along with form to sign for meds to be shipped to our office; putting provider portion in [facility] folder for [place] to sign.

## 2021-06-25 ENCOUNTER — Other Ambulatory Visit: Payer: Self-pay

## 2021-06-25 MED ORDER — TADALAFIL 20 MG PO TABS
20.0000 mg | ORAL_TABLET | Freq: Every day | ORAL | 11 refills | Status: AC | PRN
Start: 2021-06-25 — End: ?
  Filled 2021-06-25: qty 30, 30d supply, fill #0
  Filled 2021-06-28: qty 9, 30d supply, fill #0
  Filled 2021-08-03: qty 9, 30d supply, fill #1
  Filled 2021-11-26: qty 9, 30d supply, fill #2

## 2021-06-25 NOTE — Telephone Encounter (Signed)
Sondra Come, MD  You 16 hours ago (4:36 PM)     Yes, okay to fill 20 mg tabs x30 with 11 refills, can be filled by PCP in the future   .[email or web address]    RX sent.

## 2021-06-28 ENCOUNTER — Other Ambulatory Visit: Payer: Self-pay

## 2021-06-28 ENCOUNTER — Other Ambulatory Visit: Payer: Self-pay | Admitting: Gerontology

## 2021-06-28 MED FILL — Furosemide Tab 20 MG: ORAL | 30 days supply | Qty: 30 | Fill #2 | Status: AC

## 2021-06-28 MED FILL — Metoprolol Succinate Tab ER 24HR 100 MG (Tartrate Equiv): ORAL | 30 days supply | Qty: 30 | Fill #1 | Status: AC

## 2021-06-29 ENCOUNTER — Other Ambulatory Visit: Payer: Self-pay

## 2021-06-29 MED FILL — Insulin Pen Needle 32 G X 4 MM (1/6" or 5/32"): 33 days supply | Qty: 100 | Fill #0 | Status: AC

## 2021-06-30 ENCOUNTER — Other Ambulatory Visit: Payer: Self-pay

## 2021-07-02 ENCOUNTER — Telehealth: Payer: Self-pay | Admitting: Pharmacist

## 2021-07-02 NOTE — Telephone Encounter (Signed)
07/02/2021 11:54:33 AM - [name] pending  -- [name] - Friday, July 02, 2021 11:53 AM --I have received the signed provider portion of [name]--holding for patient to return his portion, mailed to patient-06/24/2021.

## 2021-07-06 ENCOUNTER — Other Ambulatory Visit: Payer: Self-pay

## 2021-07-07 ENCOUNTER — Other Ambulatory Visit: Payer: Medicaid Other

## 2021-07-07 ENCOUNTER — Other Ambulatory Visit: Payer: Self-pay

## 2021-07-07 DIAGNOSIS — E119 Type 2 diabetes mellitus without complications: Secondary | ICD-10-CM

## 2021-07-08 ENCOUNTER — Other Ambulatory Visit: Payer: Self-pay

## 2021-07-09 LAB — HEMOGLOBIN A1C
Est. average glucose Bld gHb Est-mCnc: 269 mg/dL
Hgb A1c MFr Bld: 11 % — ABNORMAL HIGH (ref 4.8–5.6)

## 2021-07-12 ENCOUNTER — Other Ambulatory Visit: Payer: Self-pay | Admitting: Family

## 2021-07-12 ENCOUNTER — Other Ambulatory Visit: Payer: Self-pay

## 2021-07-12 MED FILL — Spironolactone Tab 25 MG: ORAL | 90 days supply | Qty: 90 | Fill #0 | Status: AC

## 2021-07-13 ENCOUNTER — Other Ambulatory Visit: Payer: Self-pay

## 2021-07-14 ENCOUNTER — Ambulatory Visit (INDEPENDENT_AMBULATORY_CARE_PROVIDER_SITE_OTHER): Payer: Self-pay

## 2021-07-14 DIAGNOSIS — I428 Other cardiomyopathies: Secondary | ICD-10-CM

## 2021-07-14 LAB — CUP PACEART REMOTE DEVICE CHECK
Battery Remaining Longevity: 85 mo
Battery Remaining Percentage: 79 %
Battery Voltage: 2.98 V
Brady Statistic AP VP Percent: 1 %
Brady Statistic AP VS Percent: 1 %
Brady Statistic AS VP Percent: 1 %
Brady Statistic AS VS Percent: 99 %
Brady Statistic RA Percent Paced: 1 %
Brady Statistic RV Percent Paced: 1 %
Date Time Interrogation Session: 20220817020044
HighPow Impedance: 65 Ohm
Implantable Lead Implant Date: 20201023
Implantable Lead Implant Date: 20211023
Implantable Lead Location: 753859
Implantable Lead Location: 753860
Implantable Pulse Generator Implant Date: 20201023
Lead Channel Impedance Value: 410 Ohm
Lead Channel Impedance Value: 440 Ohm
Lead Channel Pacing Threshold Amplitude: 0.625 V
Lead Channel Pacing Threshold Amplitude: 1.125 V
Lead Channel Pacing Threshold Pulse Width: 0.5 ms
Lead Channel Pacing Threshold Pulse Width: 0.5 ms
Lead Channel Sensing Intrinsic Amplitude: 12 mV
Lead Channel Sensing Intrinsic Amplitude: 3.8 mV
Lead Channel Setting Pacing Amplitude: 0.875
Lead Channel Setting Pacing Amplitude: 2.125
Lead Channel Setting Pacing Pulse Width: 0.5 ms
Lead Channel Setting Sensing Sensitivity: 0.5 mV
Pulse Gen Serial Number: 111007884

## 2021-07-20 ENCOUNTER — Other Ambulatory Visit: Payer: Self-pay

## 2021-07-20 MED FILL — Glucose Blood Test Strip: 25 days supply | Qty: 100 | Fill #3 | Status: AC

## 2021-07-20 MED FILL — Lidocaine Patch 5%: CUTANEOUS | 30 days supply | Qty: 30 | Fill #1 | Status: AC

## 2021-07-22 ENCOUNTER — Telehealth: Payer: Self-pay | Admitting: Cardiology

## 2021-07-22 ENCOUNTER — Other Ambulatory Visit: Payer: Self-pay

## 2021-07-22 NOTE — Telephone Encounter (Signed)
Patient had to reschedule appointments for late October He will be going to [facility] and wanted to discuss if he is ok for travel Please call to discuss

## 2021-07-26 ENCOUNTER — Ambulatory Visit: Payer: Medicaid Other | Admitting: Cardiology

## 2021-07-26 NOTE — Telephone Encounter (Signed)
Sent [employer].  If Pt has further questions-send to device clinic.

## 2021-07-26 NOTE — Telephone Encounter (Signed)
Called patient and informed him that it would be okay to follow up in October and that he was okay to travel per recommendation below"  Debbe Odea, MD  You 4 days ago   Okay to travel, keep medications as prescribed.    Patient also inquired about his most recent defibrillator report, stating that he saw 2 arrhythmias on it, and would like to discuss with Dr. Geannie Risen nurse if this could have been caused by his stress due to a death in the family at the time of the occurrence. He wanted to make sure he is safe to travel with the recent findings. He stated he would appreciate a follow up call to discuss concerns further. I informed him that I would let Dr. Lovena Neighbours nurse know for him.  Patient was grateful for the call back.

## 2021-07-28 ENCOUNTER — Other Ambulatory Visit: Payer: Self-pay

## 2021-07-28 ENCOUNTER — Encounter: Payer: Self-pay | Admitting: Gerontology

## 2021-07-28 ENCOUNTER — Ambulatory Visit: Payer: Medicaid Other | Admitting: Gerontology

## 2021-07-28 VITALS — BP 126/85 | HR 82 | Temp 97.1°F | Resp 16 | Ht 72.0 in | Wt 180.4 lb

## 2021-07-28 DIAGNOSIS — I110 Hypertensive heart disease with heart failure: Secondary | ICD-10-CM

## 2021-07-28 DIAGNOSIS — G629 Polyneuropathy, unspecified: Secondary | ICD-10-CM

## 2021-07-28 DIAGNOSIS — E1165 Type 2 diabetes mellitus with hyperglycemia: Secondary | ICD-10-CM

## 2021-07-28 LAB — GLUCOSE, POCT (MANUAL RESULT ENTRY): POC Glucose: 282 mg/dl — AB (ref 70–99)

## 2021-07-28 MED ORDER — GABAPENTIN 300 MG PO CAPS
600.0000 mg | ORAL_CAPSULE | Freq: Every day | ORAL | 1 refills | Status: DC
  Filled 2021-07-28: qty 90, 45d supply, fill #0

## 2021-07-28 MED ORDER — METOPROLOL SUCCINATE ER 100 MG PO TB24
ORAL_TABLET | ORAL | 1 refills | Status: DC
  Filled 2021-07-28: qty 30, 30d supply, fill #0

## 2021-07-28 MED ORDER — BASAGLAR KWIKPEN 100 UNIT/ML ~~LOC~~ SOPN: 48.0000 [IU] | PEN_INJECTOR | Freq: Every day | SUBCUTANEOUS | 0 refills | Status: AC

## 2021-07-28 MED ORDER — FUROSEMIDE 20 MG PO TABS
ORAL_TABLET | Freq: Every day | ORAL | 0 refills | Status: DC
  Filled 2021-07-28: qty 30, 30d supply, fill #0

## 2021-07-28 NOTE — Progress Notes (Signed)
Established Patient Office Visit  Subjective:  Patient ID: Travis Lucas, male    DOB: 04-08-1971  Age: 50 y.o. MRN: [id number]  CC:  Chief Complaint  Patient presents with   Follow-up   Diabetes    Patient is checking blood sugars fasting 80-90 and prior to eating 250-300.    HPI Travis Lucas is a 50 y.o. male who  has a past medical history of BOOP (bronchiolitis obliterans with organizing pneumonia) ([place]), CHF (congestive heart failure) ([place]), Chronic back pain, Diabetes mellitus without complication ([place]), Hyperlipidemia, Hypertension, and Ischemic cardiomyopathy with implantable cardioverter-defibrillator (ICD presents for routine care visit, lab review and medication refill. His HgbA1c done on 07/07/21 increased from 10.6% to 11%. He checks his blood glucose tid, and states that his fasting reading was usually between 80-90 mg/dl. Hid blood glucose was 282 mg/dl when checked during visit, he states that he has not taking his morning dose of Novolog. He denies hypoglycemic symptoms, taking gabapentin improves his peripheral neuropathy and he performs daily foot checks. He was seen by [facility] Endocrinology team on 06/22/21, and was started on 10 mg [name], though he has not received medication from Pharmacy.  He has an Ophthalmology appointment on 07/29/21 at [facility] eye center.He was seen at the CHF clinic by [facility] FNP on 06/16/21 and he will continue to follow up with Cardiologist. He states that he's compliant with his medications and continues to make healthy life style changes. Overall, he states that he's doing well and will visit [facility] for 6 weeks, he offers no further complaint.    Past Medical History:  Diagnosis Date   BOOP (bronchiolitis obliterans with organizing pneumonia) ([place])    CHF (congestive heart failure) ([place])    Chronic back pain    Diabetes mellitus without complication ([place])    Hyperlipidemia    Hypertension    Ischemic cardiomyopathy with implantable  cardioverter-defibrillator (ICD)     Past Surgical History:  Procedure Laterality Date   CARDIAC DEFIBRILLATOR PLACEMENT  09/10/2019   CHOLECYSTECTOMY      Family History  Problem Relation Age of Onset   Hypertension Mother    Diabetes Father    Congestive Heart Failure Father    Prostate cancer Neg Hx    Bladder Cancer Neg Hx    Kidney cancer Neg Hx     Social History   Socioeconomic History   Marital status: Single    Spouse name: Not on file   Number of children: Not on file   Years of education: Not on file   Highest education level: Not on file  Occupational History   Not on file  Tobacco Use   Smoking status: Every Day    Types: Cigars   Smokeless tobacco: Never   Tobacco comments:    2 cigars per day  Vaping Use   Vaping Use: Some days   Substances: Nicotine, Flavoring  Substance and Sexual Activity   Alcohol use: Yes    Comment: 1 drink every 2 weeks   Drug use: Yes    Types: Marijuana    Comment: 2-3 times daily every day   Sexual activity: Not on file  Other Topics Concern   Not on file  Social History Narrative   Not on file   Social Determinants of Health   Financial Resource Strain: Not on file  Food Insecurity: No Food Insecurity   Worried About Running Out of Food in the Last Year: Never true   Ran  Out of Food in the Last Year: Never true  Transportation Needs: Unmet Transportation Needs   Lack of Transportation (Medical): Yes   Lack of Transportation (Non-Medical): No  Physical Activity: Not on file  Stress: Not on file  Social Connections: Not on file  Intimate Partner Violence: Not on file    Outpatient Medications Prior to Visit  Medication Sig Dispense Refill   atorvastatin (LIPITOR) 80 MG tablet TAKE ONE TABLET BY MOUTH EVERY DAY 90 tablet 1   blood glucose meter kit and supplies KIT Dispense based on patient and insurance preference. Use up to four times daily as directed. (FOR ICD-9 250.00, 250.01). 1 each 0   COMFORT EZ PEN  NEEDLES 32G X 4 MM MISC USE AS DIRECTED 100 each 99   insulin aspart (NOVOLOG FLEXPEN) 100 UNIT/ML FlexPen INJECT 22 UNITS INTO THE SKIN 3 TIMES A DAY WITH MEALS (Patient taking differently: 10-15 units) 90 mL 3   Insulin Pen Needle (PEN NEEDLES) 31G X 5 MM MISC 15 Units by Does not apply route 2 (two) times daily. 100 each 6   Insulin Pen Needle 32G X 4 MM MISC USE WITH INSULIN 2 TIMES A DAY 100 each 6   mirtazapine (REMERON) 15 MG tablet Take one tablet (15 mg) by mouth once daily at bedtime for insomnia or anxiety. (Patient taking differently: Take 15 mg by mouth. Taking 2 tablets (30 mg)) 30 tablet 1   Rightest GL300 Lancets MISC USE AS DIRECTED. 100 each PRN   RIGHTEST OI370 BLOOD GLUCOSE test strip USE AS DIRECTED. 100 strip 99   sacubitril-valsartan (ENTRESTO) 97-103 MG TAKE ONE TABLET BY MOUTH 2 TIMES A DAY 60 tablet 5   spironolactone (ALDACTONE) 25 MG tablet Take 1 tablet (25 mg total) by mouth once daily. 90 tablet 0   tadalafil (CIALIS) 20 MG tablet Take 1 tablet (20 mg total) by mouth daily as needed for erectile dysfunction. 30 tablet 11   vitamin B-12 (CYANOCOBALAMIN) 1000 MCG tablet Take 1 tablet (1,000 mcg total) by mouth daily. 90 tablet 1   furosemide (LASIX) 20 MG tablet TAKE ONE TABLET BY MOUTH EVERY DAY 30 tablet 3   gabapentin (NEURONTIN) 300 MG capsule Take 2 capsules (600 mg total) by mouth at bedtime. 60 capsule 2   Insulin Glargine (BASAGLAR KWIKPEN) 100 UNIT/ML Inject 48 Units into the skin daily. 5 mL 0   metoprolol succinate (TOPROL-XL) 100 MG 24 hr tablet TAKE ONE TABLET BY MOUTH. TAKE WITH OR IMMEDIATELY FOLLOWING A MEAL 30 tablet 5   empagliflozin ([name]) 10 MG TABS tablet Take 1 tablet (10 mg total) by mouth daily before breakfast. (Patient not taking: Reported on 07/28/2021) 90 tablet 4   lidocaine (LIDODERM) 5 % APPLY ONE PATCH ONTO THE SKIN AS DIRECTED BY MD. LEAVE ON FOR UP TO 12 HOURS ONLY, THEN REMOVE AND DISCARD PATCH. (Patient not taking: Reported on  07/28/2021) 30 patch 1   Vitamin D, Ergocalciferol, (DRISDOL) 1.25 MG (50000 UNIT) CAPS capsule TAKE ONE CAPSULE BY MOUTH ONCE EVERY WEEK ON THE SAME DAY (Patient not taking: Reported on 07/28/2021) 5 capsule 1   No facility-administered medications prior to visit.    Allergies  Allergen Reactions   Penicillins     Skin peeled    ROS Review of Systems  Constitutional: Negative.   Eyes: Negative.   Respiratory: Negative.    Cardiovascular: Negative.   Endocrine: Negative.   Skin: Negative.   Neurological: Negative.   Psychiatric/Behavioral: Negative.  Objective:    Physical Exam HENT:     Head: Normocephalic and atraumatic.     Mouth/Throat:     Mouth: Mucous membranes are moist.  Eyes:     Extraocular Movements: Extraocular movements intact.     Conjunctiva/sclera: Conjunctivae normal.     Pupils: Pupils are equal, round, and reactive to light.  Cardiovascular:     Rate and Rhythm: Normal rate and regular rhythm.     Pulses: Normal pulses.     Heart sounds: Normal heart sounds.  Pulmonary:     Effort: Pulmonary effort is normal.     Breath sounds: Normal breath sounds.  Skin:    General: Skin is warm.  Neurological:     General: No focal deficit present.     Mental Status: He is alert and oriented to person, place, and time. Mental status is at baseline.  Psychiatric:        Mood and Affect: Mood normal.        Behavior: Behavior normal.        Thought Content: Thought content normal.        Judgment: Judgment normal.    BP 126/85 (BP Location: Right Arm, Patient Position: Sitting, Cuff Size: Large)   Pulse 82   Temp (!) 97.1 F (36.2 C)   Resp 16   Ht 6' (1.829 m)   Wt 180 lb 6.4 oz (81.8 kg)   SpO2 98%   BMI 24.47 kg/m  Wt Readings from Last 3 Encounters:  07/28/21 180 lb 6.4 oz (81.8 kg)  07/07/21 184 lb 11.2 oz (83.8 kg)  06/22/21 188 lb 3 oz (85.4 kg)     Health Maintenance Due  Topic Date Due   COVID-19 Vaccine (1) Never done    OPHTHALMOLOGY EXAM  Never done   Hepatitis C Screening  Never done   TETANUS/TDAP  Never done   COLONOSCOPY (Pts 45-41yr Insurance coverage will need to be confirmed)  Never done   INFLUENZA VACCINE  06/28/2021    There are no preventive care reminders to display for this patient.  Lab Results  Component Value Date   TSH 2.550 01/06/2021   Lab Results  Component Value Date   WBC 4.8 04/09/2021   HGB 12.2 (L) 04/09/2021   HCT 35.9 (L) 04/09/2021   MCV 88.2 04/09/2021   PLT 186 04/09/2021   Lab Results  Component Value Date   NA 144 04/15/2021   K 3.6 04/15/2021   CO2 22 04/15/2021   GLUCOSE 90 04/15/2021   BUN 11 04/15/2021   CREATININE 0.79 04/15/2021   BILITOT 0.2 04/15/2021   ALKPHOS 79 04/15/2021   AST 22 04/15/2021   ALT 29 04/15/2021   PROT 5.9 (L) 04/15/2021   ALBUMIN 3.8 (L) 04/15/2021   CALCIUM 8.9 04/15/2021   ANIONGAP 7 04/09/2021   EGFR 110 04/15/2021   Lab Results  Component Value Date   CHOL 136 01/06/2021   Lab Results  Component Value Date   HDL 59 01/06/2021   Lab Results  Component Value Date   LDLCALC 60 01/06/2021   Lab Results  Component Value Date   TRIG 91 01/06/2021   Lab Results  Component Value Date   CHOLHDL 2.3 01/06/2021   Lab Results  Component Value Date   HGBA1C 11.0 (H) 07/07/2021      Assessment & Plan:    1. Peripheral polyneuropathy - His peripheral neuropathy is under control, will continue on current medication and advised on tighter glycemic  control. - gabapentin (NEURONTIN) 300 MG capsule; Take 2 capsules (600 mg total) by mouth once daily at bedtime.  Dispense: 90 capsule; Refill: 1  2. Uncontrolled type 2 diabetes mellitus with hyperglycemia (HCC) - His HgbA1c was 11%, and it's not controlled, his goal should be less than 7%. He will continue on current medication, low carb/non concentrated sweet diet. - Insulin Glargine (BASAGLAR KWIKPEN) 100 UNIT/ML; Inject 48 Units into the skin daily.  Dispense: 5  mL; Refill: 0 - POCT Glucose (CBG); Future - HgB A1c; Future - POCT Glucose (CBG)  3. Hypertensive heart disease with heart failure ([place]) - He will continue on current medication, advised to follow up with Cardiologist. - furosemide (LASIX) 20 MG tablet; TAKE ONE TABLET BY MOUTH EVERY DAY  Dispense: 90 tablet; Refill: 0 - metoprolol succinate (TOPROL-XL) 100 MG 24 hr tablet; TAKE ONE TABLET BY MOUTH ONCE DAILY. (TAKE WITH OR IMMEDIATELY FOLLOWING A MEAL).  Dispense: 90 tablet; Refill: 1    Follow-up: Return in about 11 weeks (around 10/13/2021), or if symptoms worsen or fail to improve.    Kosisochukwu Goldberg Jerold Coombe, NP

## 2021-07-28 NOTE — Patient Instructions (Signed)
Carbohydrate Counting for Diabetes Mellitus, Adult Carbohydrate counting is a method of keeping track of how many carbohydrates you eat. Eating carbohydrates naturally increases the amount of sugar (glucose) in the blood. Counting how many carbohydrates you eat improves your blood glucose control, which helps you manage your diabetes. It is important to know how many carbohydrates you can safely have in each meal. This is different for every person. A dietitian can help you make a meal plan and calculate how many carbohydrates you should have at each meal and snack. What foods contain carbohydrates? Carbohydrates are found in the following foods: Grains, such as breads and cereals. Dried beans and soy products. Starchy vegetables, such as potatoes, peas, and corn. Fruit and fruit juices. Milk and yogurt. Sweets and snack foods, such as cake, cookies, candy, chips, and soft drinks. How do I count carbohydrates in foods? There are two ways to count carbohydrates in food. You can read food labels or learn standard serving sizes of foods. You can use either of the methods or a combination of both. Using the Nutrition Facts label The Nutrition Facts list is included on the labels of almost all packaged foods and beverages in the [place] It includes: The serving size. Information about nutrients in each serving, including the grams (g) of carbohydrate per serving. To use the Nutrition Facts: Decide how many servings you will have. Multiply the number of servings by the number of carbohydrates per serving. The resulting number is the total amount of carbohydrates that you will be having. Learning the standard serving sizes of foods When you eat carbohydrate foods that are not packaged or do not include Nutrition Facts on the label, you need to measure the servings in order to count the amount of carbohydrates. Measure the foods that you will eat with a food scale or measuring cup, if needed. Decide how  many standard-size servings you will eat. Multiply the number of servings by 15. For foods that contain carbohydrates, one serving equals 15 g of carbohydrates. For example, if you eat 2 cups or 10 oz (300 g) of strawberries, you will have eaten 2 servings and 30 g of carbohydrates (2 servings x 15 g = 30 g). For foods that have more than one food mixed, such as soups and casseroles, you must count the carbohydrates in each food that is included. The following list contains standard serving sizes of common carbohydrate-rich foods. Each of these servings has about 15 g of carbohydrates: 1 slice of bread. 1 six-inch (15 cm) tortilla. ? cup or 2 oz (53 g) cooked rice or pasta.  cup or 3 oz (85 g) cooked or canned, drained and rinsed beans or lentils.  cup or 3 oz (85 g) starchy vegetable, such as peas, corn, or squash.  cup or 4 oz (120 g) hot cereal.  cup or 3 oz (85 g) boiled or mashed potatoes, or  or 3 oz (85 g) of a large baked potato.  cup or 4 fl oz (118 mL) fruit juice. 1 cup or 8 fl oz (237 mL) milk. 1 small or 4 oz (106 g) apple.  or 2 oz (63 g) of a medium banana. 1 cup or 5 oz (150 g) strawberries. 3 cups or 1 oz (24 g) popped popcorn. What is an example of carbohydrate counting? To calculate the number of carbohydrates in this sample meal, follow the steps shown below. Sample meal 3 oz (85 g) chicken breast. ? cup or 4 oz (106 g) brown   rice.  cup or 3 oz (85 g) corn. 1 cup or 8 fl oz (237 mL) milk. 1 cup or 5 oz (150 g) strawberries with sugar-free whipped topping. Carbohydrate calculation Identify the foods that contain carbohydrates: Rice. Corn. Milk. Strawberries. Calculate how many servings you have of each food: 2 servings rice. 1 serving corn. 1 serving milk. 1 serving strawberries. Multiply each number of servings by 15 g: 2 servings rice x 15 g = 30 g. 1 serving corn x 15 g = 15 g. 1 serving milk x 15 g = 15 g. 1 serving strawberries x 15 g = 15  g. Add together all of the amounts to find the total grams of carbohydrates eaten: 30 g + 15 g + 15 g + 15 g = 75 g of carbohydrates total. What are tips for following this plan? Shopping Develop a meal plan and then make a shopping list. Buy fresh and frozen vegetables, fresh and frozen fruit, dairy, eggs, beans, lentils, and whole grains. Look at food labels. Choose foods that have more fiber and less sugar. Avoid processed foods and foods with added sugars. Meal planning Aim to have the same amount of carbohydrates at each meal and for each snack time. Plan to have regular, balanced meals and snacks. Where to find more information American Diabetes Association: www.diabetes.org Centers for Disease Control and Prevention: www.cdc.gov Summary Carbohydrate counting is a method of keeping track of how many carbohydrates you eat. Eating carbohydrates naturally increases the amount of sugar (glucose) in the blood. Counting how many carbohydrates you eat improves your blood glucose control, which helps you manage your diabetes. A dietitian can help you make a meal plan and calculate how many carbohydrates you should have at each meal and snack. This information is not intended to replace advice given to you by your health care provider. Make sure you discuss any questions you have with your health care provider. Document Revised: 11/14/2019 Document Reviewed: 11/15/2019 Elsevier Patient Education  2021 Elsevier Inc.  

## 2021-07-30 ENCOUNTER — Telehealth: Payer: Self-pay | Admitting: Pharmacist

## 2021-07-30 ENCOUNTER — Other Ambulatory Visit: Payer: Self-pay

## 2021-07-30 NOTE — Telephone Encounter (Signed)
07/30/2021 4:09:58 PM - [name] faxed to [employer]  -- [name] - Friday, July 30, 2021 4:09 PM --Faxed [employer] application for [employer] 10mg  for enrollment, patient signed letter to ship to our office.

## 2021-08-03 ENCOUNTER — Other Ambulatory Visit: Payer: Self-pay

## 2021-08-03 ENCOUNTER — Telehealth: Payer: Self-pay | Admitting: Pharmacist

## 2021-08-03 NOTE — Telephone Encounter (Signed)
08/03/2021 10:55:19 AM - Sherryll Burger refill called to [employer]  -- [name] - Tuesday, August 03, 2021 10:54 AM --Jeanene Erb [employer] to refill Sherryll Burger 97/103, allow 2-3 business days for [place] to receive at our address.

## 2021-08-03 NOTE — Progress Notes (Signed)
Remote ICD transmission.   

## 2021-08-04 ENCOUNTER — Other Ambulatory Visit: Payer: Self-pay | Admitting: Gerontology

## 2021-08-04 ENCOUNTER — Other Ambulatory Visit: Payer: Self-pay

## 2021-08-09 ENCOUNTER — Other Ambulatory Visit: Payer: Self-pay

## 2021-08-16 ENCOUNTER — Other Ambulatory Visit: Payer: Self-pay

## 2021-08-16 ENCOUNTER — Ambulatory Visit: Payer: Medicaid Other | Admitting: Pharmacist

## 2021-08-16 DIAGNOSIS — Z79899 Other long term (current) drug therapy: Secondary | ICD-10-CM

## 2021-08-16 NOTE — Progress Notes (Signed)
Medication Management Clinic Visit Note  Patient: Travis Lucas MRN: [id number] Date of Birth: September 12, 1971 PCP: Langston Reusing, NP   Scheryl Darter Bress 50 y.o. male presents for a medication therapy management review via telephone with the pharmacist. Patient was identified by name, phone number, and date of birth.  There were no vitals taken for this visit.  Patient Information   Past Medical History:  Diagnosis Date   BOOP (bronchiolitis obliterans with organizing pneumonia) ([place])    CHF (congestive heart failure) ([place])    Chronic back pain    Diabetes mellitus without complication ([place])    Hyperlipidemia    Hypertension    Ischemic cardiomyopathy with implantable cardioverter-defibrillator (ICD)       Past Surgical History:  Procedure Laterality Date   CARDIAC DEFIBRILLATOR PLACEMENT  09/10/2019   CHOLECYSTECTOMY       Family History  Problem Relation Age of Onset   Hypertension Mother    Diabetes Father    Congestive Heart Failure Father    Prostate cancer Neg Hx    Bladder Cancer Neg Hx    Kidney cancer Neg Hx     New Diagnoses (since last visit):   Family Support: Good  Lifestyle Diet: Breakfast: cereal, oatmeal, eggs, grits Lunch: Fish, vegetables; sometimes sandwiches; salads Dinner: fish, chicken, plant-based meats Drinks: Water, Gatorade Zero  Outpatient Encounter Medications as of 08/16/2021  Medication Sig   atorvastatin (LIPITOR) 80 MG tablet TAKE ONE TABLET BY MOUTH EVERY DAY   blood glucose meter kit and supplies KIT Dispense based on patient and insurance preference. Use up to four times daily as directed. (FOR ICD-9 250.00, 250.01).   COMFORT EZ PEN NEEDLES 32G X 4 MM MISC USE AS DIRECTED   empagliflozin ([name]) 10 MG TABS tablet Take 1 tablet (10 mg total) by mouth daily before breakfast. (Patient taking differently: Take 10 mg by mouth daily before breakfast. Waiting for delivery from manufacturer)   furosemide (LASIX) 20 MG tablet  TAKE ONE TABLET BY MOUTH EVERY DAY   gabapentin (NEURONTIN) 300 MG capsule Take 2 capsules (600 mg total) by mouth once daily at bedtime.   insulin aspart (NOVOLOG FLEXPEN) 100 UNIT/ML FlexPen INJECT 22 UNITS INTO THE SKIN 3 TIMES A DAY WITH MEALS (Patient taking differently: 10-15 units)   Insulin Glargine (BASAGLAR KWIKPEN) 100 UNIT/ML Inject 48 Units into the skin daily.   lidocaine (LIDODERM) 5 % APPLY ONE PATCH ONTO THE SKIN AS DIRECTED BY MD. LEAVE ON FOR UP TO 12 HOURS ONLY, THEN REMOVE AND DISCARD PATCH.   metoprolol succinate (TOPROL-XL) 100 MG 24 hr tablet TAKE ONE TABLET BY MOUTH ONCE DAILY. (TAKE WITH OR IMMEDIATELY FOLLOWING A MEAL).   mirtazapine (REMERON) 15 MG tablet Take one tablet (15 mg) by mouth once daily at bedtime for insomnia or anxiety. (Patient taking differently: Take 15 mg by mouth. Taking 2 tablets (30 mg))   Rightest GL300 Lancets MISC USE AS DIRECTED.   RIGHTEST EX528 BLOOD GLUCOSE test strip USE AS DIRECTED.   sacubitril-valsartan (ENTRESTO) 97-103 MG TAKE ONE TABLET BY MOUTH 2 TIMES A DAY   spironolactone (ALDACTONE) 25 MG tablet Take 1 tablet (25 mg total) by mouth once daily.   tadalafil (CIALIS) 20 MG tablet Take 1 tablet (20 mg total) by mouth daily as needed for erectile dysfunction.   vitamin B-12 (CYANOCOBALAMIN) 1000 MCG tablet Take 1 tablet (1,000 mcg total) by mouth daily.   [DISCONTINUED] Insulin Pen Needle (PEN NEEDLES) 31G X 5 MM MISC 15 Units  by Does not apply route 2 (two) times daily.   [DISCONTINUED] Insulin Pen Needle 32G X 4 MM MISC USE WITH INSULIN 2 TIMES A DAY   Vitamin D, Ergocalciferol, (DRISDOL) 1.25 MG (50000 UNIT) CAPS capsule TAKE ONE CAPSULE BY MOUTH ONCE EVERY WEEK ON THE SAME DAY (Patient not taking: No sig reported)   [DISCONTINUED] glipiZIDE (GLUCOTROL XL) 2.5 MG 24 hr tablet Take one tablet with lunch   No facility-administered encounter medications on file as of 08/16/2021.             Social History   Substance and Sexual  Activity  Alcohol Use Yes   Comment: 1 drink every 2 weeks      Social History   Tobacco Use  Smoking Status Every Day   Types: Cigars  Smokeless Tobacco Never  Tobacco Comments   2 cigars per day      Health Maintenance  Topic Date Due   COVID-19 Vaccine (1) Never done   OPHTHALMOLOGY EXAM  Never done   Hepatitis C Screening  Never done   TETANUS/TDAP  Never done   COLONOSCOPY (Pts 45-50yr Insurance coverage will need to be confirmed)  Never done   INFLUENZA VACCINE  06/28/2021   HEMOGLOBIN A1C  01/07/2022   FOOT EXAM  06/22/2022   HIV Screening  Completed   HPV VACCINES  Aged Out   Health Maintenance/Date Completed  Last ED visit: 03/2021 Last Visit to PCP: 07/28/2021 Next Visit to PCP: 11/16 Specialist Visit: 10/25 Cardiologist Dental Exam: 2017 Eye Exam: 2018 Prostate Exam: None Colonoscopy: None Flu Vaccine: 10/2020 Pneumonia Vaccine: 04/09/2021 COVID-19 Vaccine: 1 out of 2 in the series; Moderna    Assessment and Plan: Adherence: Nonadherence ~ once a week. Recommended pill [facility] Diabetes: Taking NWater engineer [name] on order through patient assistance. Checking blood glucose once daily before dinner. BG average 250. Last A1c 11.0% (07/07/2021). Patient [facility]  HF: Most recent ECHO 01/25/2021 showing EF 20-25% per notes. Taking Toprol XL, Entresto, spironolactone, and furosemide. Followed by HF clinic and cardiologist. Has an appointment with cardiologist on 10/25.  HTN: Checks BP at home once a week. BP ~100/85.  RTC: 1 year  CWynelle Cleveland PharmD Pharmacy Resident  08/16/2021 12:15 PM

## 2021-08-17 ENCOUNTER — Other Ambulatory Visit: Payer: Self-pay

## 2021-08-20 ENCOUNTER — Other Ambulatory Visit: Payer: Self-pay

## 2021-08-24 ENCOUNTER — Other Ambulatory Visit: Payer: Self-pay

## 2021-08-24 MED ORDER — NOVOFINE PEN NEEDLE 32G X 6 MM MISC
99 refills | Status: AC
  Filled 2021-09-27: qty 300, 100d supply, fill #0
  Filled 2021-11-19: qty 400, 134d supply, fill #0

## 2021-08-27 ENCOUNTER — Other Ambulatory Visit: Payer: Self-pay

## 2021-09-02 ENCOUNTER — Other Ambulatory Visit: Payer: Self-pay

## 2021-09-02 MED FILL — Sacubitril-Valsartan Tab 97-103 MG: ORAL | 90 days supply | Qty: 180 | Fill #1 | Status: CN

## 2021-09-03 ENCOUNTER — Telehealth: Payer: Self-pay | Admitting: Pharmacy Technician

## 2021-09-03 NOTE — Telephone Encounter (Signed)
Travis Lucas spoke with a representative at [employer] regarding address for shipment of [name].  We have [address], [place] on application and patient indicated to [employer] that his address is [address], [place].  Called patient to verify his address.  Patient indicated that his physical address is [address], [place].  He uses the [address], [place] as his mailing address for personal reasons.  Patient indicated that he physically lives at [address] [place].  Sherilyn Dacosta Care Manager Medication [facility] Clinic

## 2021-09-06 ENCOUNTER — Other Ambulatory Visit: Payer: Self-pay

## 2021-09-10 ENCOUNTER — Other Ambulatory Visit: Payer: Self-pay

## 2021-09-14 ENCOUNTER — Ambulatory Visit: Payer: Medicaid Other

## 2021-09-16 ENCOUNTER — Other Ambulatory Visit: Payer: Self-pay

## 2021-09-21 ENCOUNTER — Other Ambulatory Visit: Payer: Self-pay

## 2021-09-23 ENCOUNTER — Ambulatory Visit: Payer: Medicaid Other | Admitting: Cardiology

## 2021-09-27 ENCOUNTER — Other Ambulatory Visit: Payer: Self-pay

## 2021-10-06 ENCOUNTER — Other Ambulatory Visit: Payer: Medicaid Other

## 2021-10-13 ENCOUNTER — Ambulatory Visit: Payer: Medicaid Other | Admitting: Gerontology

## 2021-10-13 ENCOUNTER — Ambulatory Visit (INDEPENDENT_AMBULATORY_CARE_PROVIDER_SITE_OTHER): Payer: Self-pay

## 2021-10-13 DIAGNOSIS — I428 Other cardiomyopathies: Secondary | ICD-10-CM

## 2021-10-14 LAB — CUP PACEART REMOTE DEVICE CHECK
Battery Remaining Longevity: 82 mo
Battery Remaining Percentage: 76 %
Battery Voltage: 2.98 V
Brady Statistic AP VP Percent: 1 %
Brady Statistic AP VS Percent: 1.5 %
Brady Statistic AS VP Percent: 1 %
Brady Statistic AS VS Percent: 98 %
Brady Statistic RA Percent Paced: 1 %
Brady Statistic RV Percent Paced: 1 %
Date Time Interrogation Session: 20221117025818
HighPow Impedance: 68 Ohm
Implantable Lead Implant Date: 20201023
Implantable Lead Implant Date: 20211023
Implantable Lead Location: 753859
Implantable Lead Location: 753860
Implantable Pulse Generator Implant Date: 20201023
Lead Channel Impedance Value: 400 Ohm
Lead Channel Impedance Value: 480 Ohm
Lead Channel Pacing Threshold Amplitude: 1 V
Lead Channel Pacing Threshold Amplitude: 1 V
Lead Channel Pacing Threshold Pulse Width: 0.5 ms
Lead Channel Pacing Threshold Pulse Width: 0.5 ms
Lead Channel Sensing Intrinsic Amplitude: 12 mV
Lead Channel Sensing Intrinsic Amplitude: 5 mV
Lead Channel Setting Pacing Amplitude: 1.25 V
Lead Channel Setting Pacing Amplitude: 2 V
Lead Channel Setting Pacing Pulse Width: 0.5 ms
Lead Channel Setting Sensing Sensitivity: 0.5 mV
Pulse Gen Serial Number: 111007884

## 2021-10-25 NOTE — Progress Notes (Signed)
Remote ICD transmission.   

## 2021-10-29 ENCOUNTER — Other Ambulatory Visit: Payer: Self-pay

## 2021-11-01 ENCOUNTER — Ambulatory Visit: Payer: Medicaid Other | Admitting: Cardiology

## 2021-11-02 ENCOUNTER — Other Ambulatory Visit: Payer: Self-pay

## 2021-11-18 ENCOUNTER — Telehealth: Payer: Self-pay | Admitting: Cardiology

## 2021-11-18 ENCOUNTER — Other Ambulatory Visit: Payer: Self-pay | Admitting: Gerontology

## 2021-11-18 ENCOUNTER — Other Ambulatory Visit: Payer: Self-pay

## 2021-11-18 ENCOUNTER — Encounter: Payer: Self-pay | Admitting: Cardiology

## 2021-11-18 DIAGNOSIS — G629 Polyneuropathy, unspecified: Secondary | ICD-10-CM

## 2021-11-18 DIAGNOSIS — E785 Hyperlipidemia, unspecified: Secondary | ICD-10-CM

## 2021-11-18 DIAGNOSIS — I110 Hypertensive heart disease with heart failure: Secondary | ICD-10-CM

## 2021-11-18 DIAGNOSIS — E119 Type 2 diabetes mellitus without complications: Secondary | ICD-10-CM

## 2021-11-18 NOTE — Telephone Encounter (Signed)
Pharmacy calling regarding Travis Lucas dose patient out of town past 3 month. Been out of medication for several weeks. Currently on higher dose & wants to know if needs to continue or change. please assist.

## 2021-11-18 NOTE — Telephone Encounter (Signed)
Debbe Odea, MD  Sent: Thu November 18, 2021  4:44 PM  To: Jefferey Pica, RN; Donnelly, Jarold Song, FNP; Picard-Tagnolli, Coleen, RN  Cc: Vida Rigger Div [facility]url Triage          Message  Prescribe entresto 97-103mg  bid as per my last note.    Thanks  BA

## 2021-11-18 NOTE — Telephone Encounter (Signed)
Patient last seen in office: 06/16/21- Clarisa Kindred, NP 02/19/21- Dr. Azucena Cecil  Will forward to both providers to review if the patient's refill for Entresto needs to be for the 97/103 mg BID as previously taking, or if he should be on a lower dose to start after being off of this for a few weeks.

## 2021-11-18 NOTE — Telephone Encounter (Signed)
Attempted to contact Adam at [facility] with Dr. Merita Norton recommendations. No answer- I left a message to please call back to discuss.

## 2021-11-19 ENCOUNTER — Other Ambulatory Visit: Payer: Self-pay

## 2021-11-19 ENCOUNTER — Other Ambulatory Visit: Payer: Self-pay | Admitting: Gerontology

## 2021-11-19 MED ORDER — SACUBITRIL-VALSARTAN 97-103 MG PO TABS
1.0000 | ORAL_TABLET | Freq: Two times a day (BID) | ORAL | 1 refills | Status: DC
  Filled 2021-11-19: qty 180, 90d supply, fill #0

## 2021-11-19 MED FILL — Spironolactone Tab 25 MG: ORAL | 17 days supply | Qty: 17 | Fill #0 | Status: AC

## 2021-11-19 MED FILL — Atorvastatin Calcium Tab 80 MG (Base Equivalent): ORAL | 17 days supply | Qty: 17 | Fill #0 | Status: AC

## 2021-11-19 MED FILL — Gabapentin Cap 300 MG: ORAL | 15 days supply | Qty: 30 | Fill #0 | Status: AC

## 2021-11-19 MED FILL — Furosemide Tab 20 MG: ORAL | 17 days supply | Qty: 17 | Fill #0 | Status: AC

## 2021-11-19 MED FILL — Insulin Aspart Soln Pen-injector 100 Unit/ML: SUBCUTANEOUS | 91 days supply | Qty: 60 | Fill #0 | Status: AC

## 2021-11-19 MED FILL — Metoprolol Succinate Tab ER 24HR 100 MG (Tartrate Equiv): ORAL | 17 days supply | Qty: 17 | Fill #0 | Status: AC

## 2021-11-19 NOTE — Telephone Encounter (Signed)
I called and spoke with Travis Lucas at [facility]. I advised him of Dr. Merita Norton recommendations to have the patient continue Entresto 97-103 mg BID as he was previously taking.  Travis Lucas voiced understanding.  I inquired if we needed to update the RX with them- per Madelaine Bhat, ok to send in a new RX refill for the patient.   RX sent to [facility].

## 2021-11-19 NOTE — Telephone Encounter (Signed)
Adam returning call 

## 2021-11-25 ENCOUNTER — Other Ambulatory Visit: Payer: Self-pay

## 2021-11-26 ENCOUNTER — Other Ambulatory Visit: Payer: Self-pay

## 2021-12-01 ENCOUNTER — Telehealth: Payer: Self-pay | Admitting: Pharmacy Technician

## 2021-12-01 NOTE — Telephone Encounter (Signed)
Patient shared with [name] that he is relocating to [place].  Drinda Butts told patient that he would  no longer be able to receive medication assistance from [facility], because he was moving to [place].  Patient verbally acknowledged that he understood.  Sherilyn Dacosta Care Manager [facility]

## 2021-12-02 ENCOUNTER — Ambulatory Visit (INDEPENDENT_AMBULATORY_CARE_PROVIDER_SITE_OTHER): Payer: Self-pay

## 2021-12-02 ENCOUNTER — Other Ambulatory Visit: Payer: Self-pay

## 2021-12-02 ENCOUNTER — Ambulatory Visit: Payer: Medicaid Other

## 2021-12-02 DIAGNOSIS — I428 Other cardiomyopathies: Secondary | ICD-10-CM

## 2021-12-02 LAB — ECHOCARDIOGRAM COMPLETE
AR max vel: 3.71 cm2
AV Area VTI: 3.34 cm2
AV Area mean vel: 3.29 cm2
AV Mean grad: 4 mmHg
AV Peak grad: 6.4 mmHg
Ao pk vel: 1.26 m/s
Calc EF: 40.7 %
Single Plane A2C EF: 41.1 %
Single Plane A4C EF: 38.7 %

## 2021-12-06 ENCOUNTER — Other Ambulatory Visit: Payer: Self-pay

## 2021-12-06 ENCOUNTER — Ambulatory Visit (INDEPENDENT_AMBULATORY_CARE_PROVIDER_SITE_OTHER): Payer: Self-pay | Admitting: Cardiology

## 2021-12-06 ENCOUNTER — Encounter: Payer: Self-pay | Admitting: Cardiology

## 2021-12-06 VITALS — BP 140/84 | HR 95 | Ht 72.0 in | Wt 164.0 lb

## 2021-12-06 DIAGNOSIS — F17200 Nicotine dependence, unspecified, uncomplicated: Secondary | ICD-10-CM

## 2021-12-06 DIAGNOSIS — I428 Other cardiomyopathies: Secondary | ICD-10-CM

## 2021-12-06 DIAGNOSIS — Z95810 Presence of automatic (implantable) cardiac defibrillator: Secondary | ICD-10-CM

## 2021-12-06 DIAGNOSIS — E7800 Pure hypercholesterolemia, unspecified: Secondary | ICD-10-CM

## 2021-12-06 DIAGNOSIS — I10 Essential (primary) hypertension: Secondary | ICD-10-CM

## 2021-12-06 IMAGING — CR DG CHEST 2V
1 series · 2 of 2 positions shown · non-contrast
Comparison: None.

CLINICAL DATA: Dyspnea

EXAM:
CHEST - 2 VIEW

[Series 1: dg chest 2 view · 0.14mm/px · 2 of 2 slices shown]
[im 1/2]
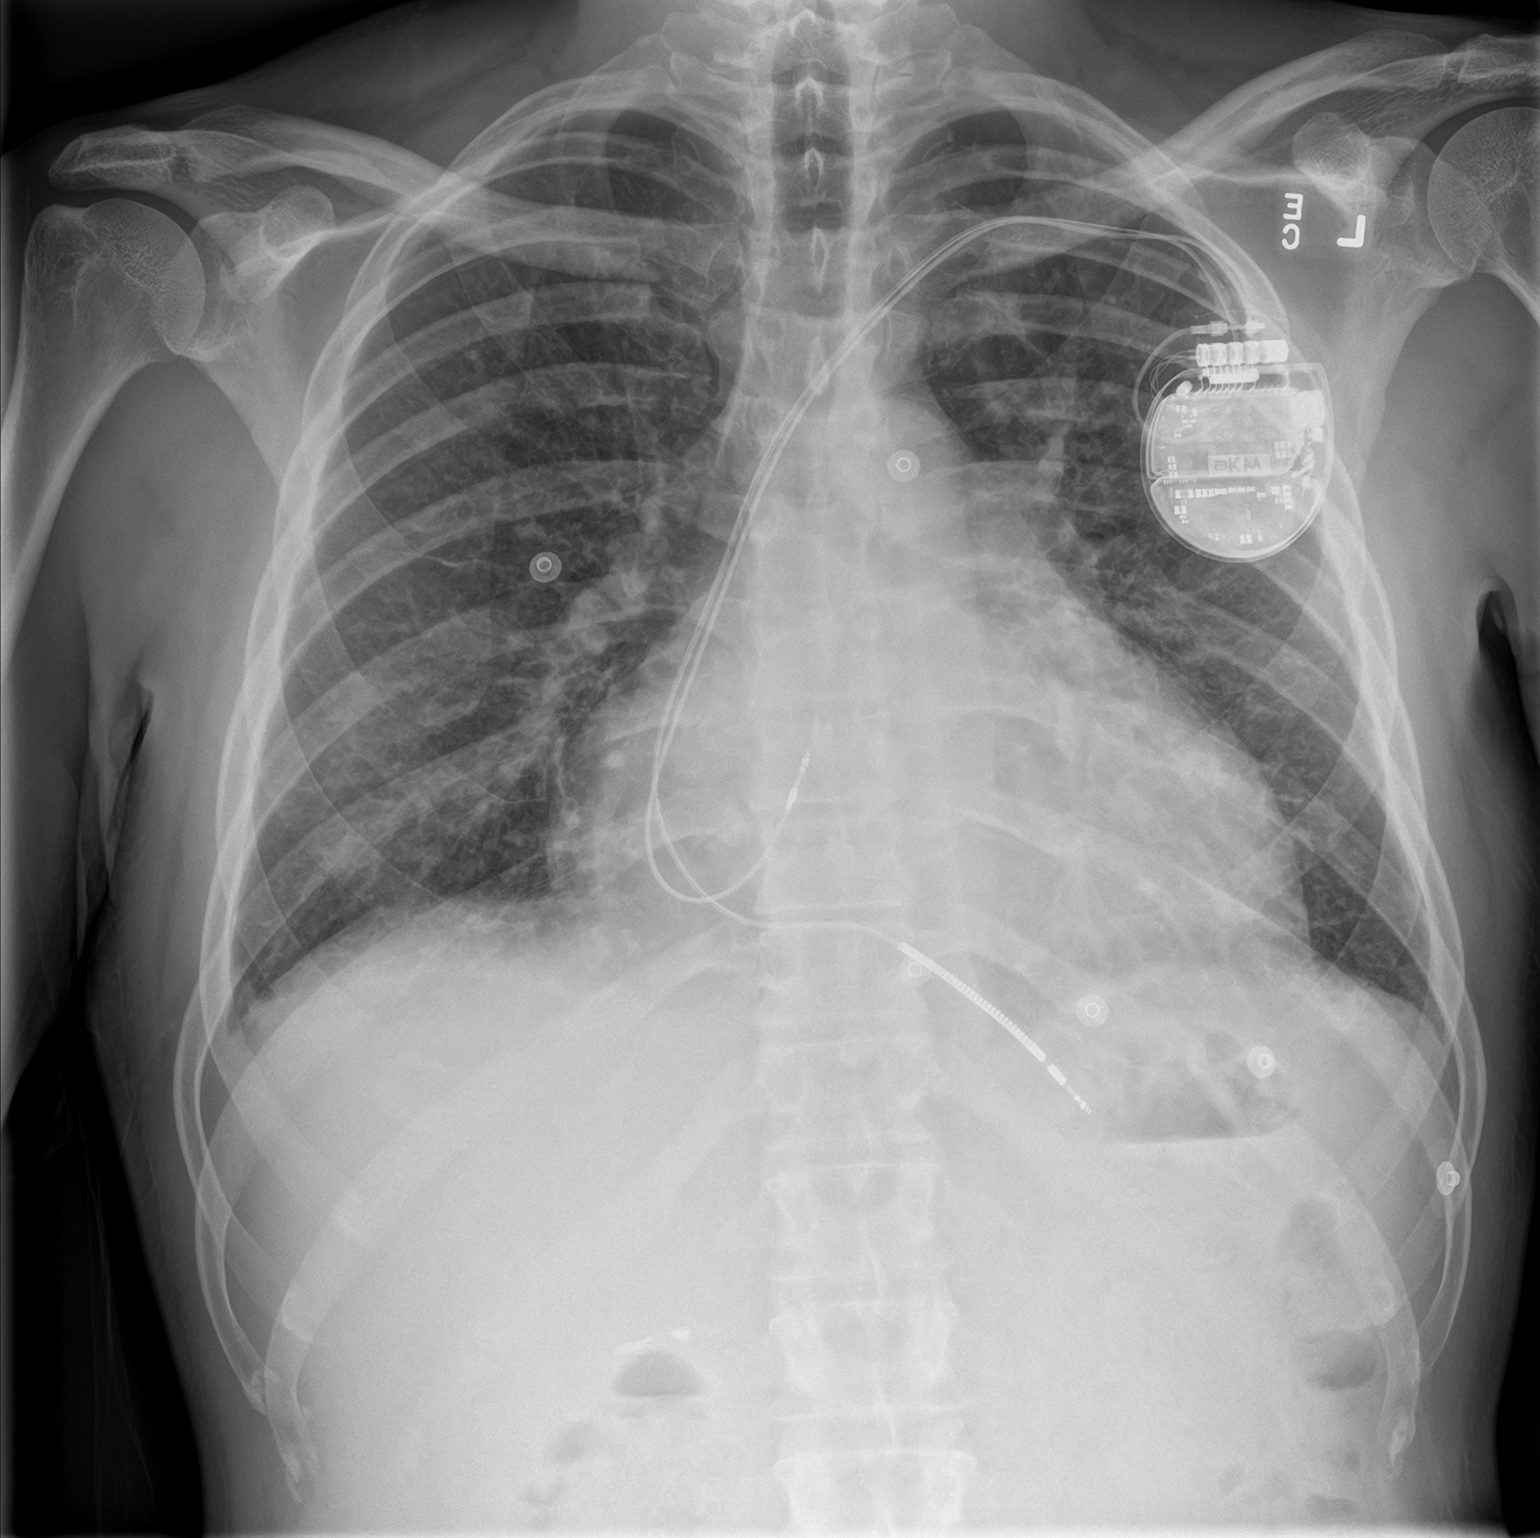
[im 2/2]
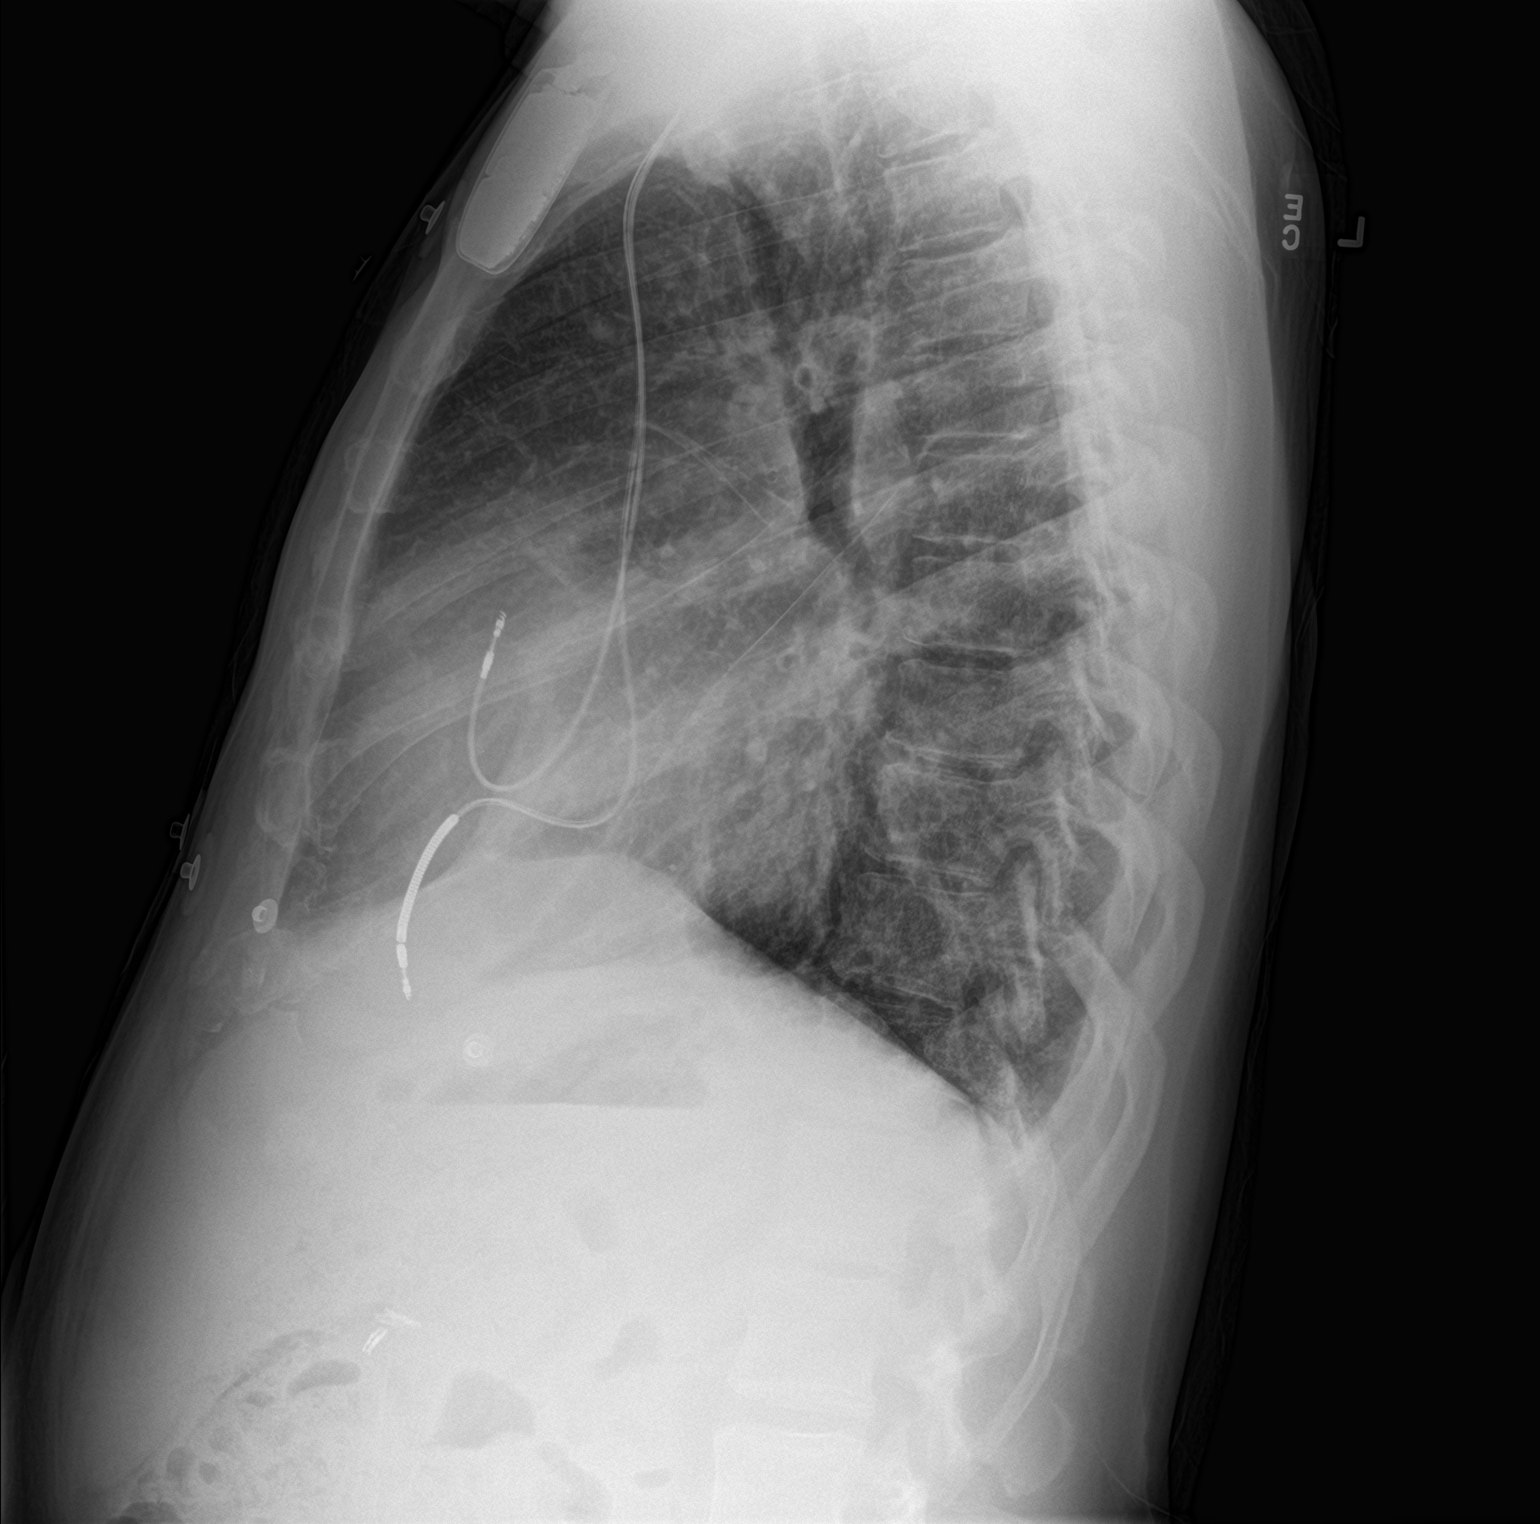

[2 of 2 positions shown; findings below may reference images not displayed]

FINDINGS: Lungs are clear. No pneumothorax or pleural effusion. Left
subclavian dual lead pacemaker defibrillator is in expected
position. Mild cardiomegaly. Pulmonary vascularity is normal. No
acute bone abnormality.
IMPRESSION: No active cardiopulmonary disease.  Mild cardiomegaly.

## 2021-12-06 MED ORDER — METOPROLOL SUCCINATE ER 100 MG PO TB24
ORAL_TABLET | ORAL | 5 refills | Status: AC
Start: 2021-12-06 — End: 2022-12-06
  Filled 2021-12-06: qty 30, fill #0
  Filled 2021-12-15: qty 30, 30d supply, fill #0

## 2021-12-06 MED ORDER — FUROSEMIDE 20 MG PO TABS
ORAL_TABLET | Freq: Every day | ORAL | 5 refills | Status: AC
  Filled 2021-12-06: qty 30, fill #0
  Filled 2021-12-15: qty 30, 30d supply, fill #0

## 2021-12-06 MED ORDER — SACUBITRIL-VALSARTAN 97-103 MG PO TABS
1.0000 | ORAL_TABLET | Freq: Two times a day (BID) | ORAL | 1 refills | Status: AC
  Filled 2021-12-06: qty 180, 90d supply, fill #0

## 2021-12-06 MED ORDER — ATORVASTATIN CALCIUM 80 MG PO TABS
ORAL_TABLET | Freq: Every day | ORAL | 5 refills | Status: AC
  Filled 2021-12-06: qty 30, fill #0
  Filled 2021-12-15: qty 30, 30d supply, fill #0

## 2021-12-06 MED ORDER — SPIRONOLACTONE 25 MG PO TABS
25.0000 mg | ORAL_TABLET | Freq: Every day | ORAL | 5 refills | Status: AC
  Filled 2021-12-06 – 2021-12-15 (×2): qty 30, 30d supply, fill #0

## 2021-12-06 MED ORDER — EMPAGLIFLOZIN 10 MG PO TABS
10.0000 mg | ORAL_TABLET | Freq: Every day | ORAL | 1 refills | Status: AC
  Filled 2021-12-06: qty 90, 90d supply, fill #0

## 2021-12-06 NOTE — Progress Notes (Signed)
Cardiology Office Note:    Date:  12/06/2021   ID:  Scheryl Darter Veillon, DOB 1971-08-10, MRN [id number]  PCP:  Langston Reusing, NP  [facility] Cardiologist:  Kate Sable, MD  [facility] Electrophysiologist:  Vickie Epley, MD   Referring MD: Langston Reusing, NP   Chief Complaint  Patient presents with   Other    Follow up post ECHO -- Meds reviewed verbally with patient.     History of Present Illness:    Travis Lucas is a 51 y.o. male with a hx of  NICM, EF < 20% s/p AICD 08/2019 ([employer]), hypertension, hyperlipidemia, current smoker presenting for follow-up.  Being seen for cardiomyopathy and medication titration.  Entresto increased to 97-103 mg twice daily.  Tolerating Toprol-XL, Aldactone.  Denies chest pain or shortness of breath.  Was recently in [place] due to some family issues.  He is considering relocating to [facility].  He feels well, had a repeat echocardiogram 4 days ago to evaluate EF.  Prior notes Left heart cath over in [place] likely [place] with no CAD. AICD placed 2020 [employer].  Past Medical History:  Diagnosis Date   BOOP (bronchiolitis obliterans with organizing pneumonia) ([place])    CHF (congestive heart failure) ([place])    Chronic back pain    Diabetes mellitus without complication ([place])    Hyperlipidemia    Hypertension    Ischemic cardiomyopathy with implantable cardioverter-defibrillator (ICD)     Past Surgical History:  Procedure Laterality Date   CARDIAC DEFIBRILLATOR PLACEMENT  09/10/2019   CHOLECYSTECTOMY      Current Medications: Current Meds  Medication Sig   blood glucose meter kit and supplies KIT Dispense based on patient and insurance preference. Use up to four times daily as directed. (FOR ICD-9 250.00, 250.01).   gabapentin (NEURONTIN) 300 MG capsule Take 2 capsules (600 mg total) by mouth once daily at bedtime.   insulin aspart (NOVOLOG FLEXPEN) 100 UNIT/ML FlexPen INJECT 22 UNITS INTO THE SKIN 3  TIMES A DAY WITH MEALS   Insulin Glargine (BASAGLAR KWIKPEN) 100 UNIT/ML Inject 48 Units into the skin daily.   Insulin Pen Needle (NOVOFINE PEN NEEDLE) 32G X 6 MM MISC USE AS DIRECTED.   mirtazapine (REMERON) 15 MG tablet Take one tablet (15 mg) by mouth once daily at bedtime for insomnia or anxiety.   Rightest GL300 Lancets MISC USE AS DIRECTED.   RIGHTEST [id number] BLOOD GLUCOSE test strip USE AS DIRECTED.   tadalafil (CIALIS) 20 MG tablet Take 1 tablet (20 mg total) by mouth daily as needed for erectile dysfunction.   vitamin B-12 (CYANOCOBALAMIN) 1000 MCG tablet Take 1 tablet (1,000 mcg total) by mouth daily.   Vitamin D, Ergocalciferol, (DRISDOL) 1.25 MG (50000 UNIT) CAPS capsule TAKE ONE CAPSULE BY MOUTH ONCE EVERY WEEK ON THE SAME DAY   [DISCONTINUED] atorvastatin (LIPITOR) 80 MG tablet TAKE ONE TABLET BY MOUTH EVERY DAY   [DISCONTINUED] empagliflozin ([name]) 10 MG TABS tablet Take 1 tablet (10 mg total) by mouth daily before breakfast.   [DISCONTINUED] furosemide (LASIX) 20 MG tablet TAKE ONE TABLET BY MOUTH EVERY DAY   [DISCONTINUED] metoprolol succinate (TOPROL-XL) 100 MG 24 hr tablet TAKE ONE TABLET BY MOUTH ONCE DAILY. (TAKE WITH OR IMMEDIATELY FOLLOWING A MEAL).   [DISCONTINUED] sacubitril-valsartan (ENTRESTO) 97-103 MG TAKE ONE TABLET BY MOUTH 2 TIMES A DAY   [DISCONTINUED] spironolactone (ALDACTONE) 25 MG tablet Take 1 tablet (25 mg total) by mouth once daily.  Allergies:   Penicillins   Social History   Socioeconomic History   Marital status: Single    Spouse name: Not on file   Number of children: Not on file   Years of education: Not on file   Highest education level: Not on file  Occupational History   Not on file  Tobacco Use   Smoking status: Every Day    Types: Cigars   Smokeless tobacco: Never   Tobacco comments:    2 cigars per day  Vaping Use   Vaping Use: Some days   Substances: Nicotine, Flavoring  Substance and Sexual Activity   Alcohol use: Yes     Comment: 1 drink every 2 weeks   Drug use: Yes    Types: Marijuana    Comment: 2-3 times daily every day   Sexual activity: Not on file  Other Topics Concern   Not on file  Social History Narrative   Not on file   Social Determinants of Health   Financial Resource Strain: Not on file  Food Insecurity: No Food Insecurity   Worried About Running Out of Food in the Last Year: Never true   Ran Out of Food in the Last Year: Never true  Transportation Needs: Unmet Transportation Needs   Lack of Transportation (Medical): Yes   Lack of Transportation (Non-Medical): No  Physical Activity: Not on file  Stress: Not on file  Social Connections: Not on file     Family History: The patient's family history includes Congestive Heart Failure in his father; Diabetes in his father; Hypertension in his mother. There is no history of Prostate cancer, Bladder Cancer, or Kidney cancer.  ROS:   Please see the history of present illness.     All other systems reviewed and are negative.  EKGs/Labs/Other Studies Reviewed:    The following studies were reviewed today:   EKG:  EKG not ordered today.   Recent Labs: 01/06/2021: TSH 2.550 04/06/2021: B Natriuretic Peptide 11.0; Magnesium 2.3 04/09/2021: Hemoglobin 12.2; Platelets 186 04/15/2021: ALT 29; BUN 11; Creatinine, Ser 0.79; Potassium 3.6; Sodium 144  Recent Lipid Panel    Component Value Date/Time   CHOL 136 01/06/2021 1257   TRIG 91 01/06/2021 1257   HDL 59 01/06/2021 1257   CHOLHDL 2.3 01/06/2021 1257   LDLCALC 60 01/06/2021 1257     Risk Assessment/Calculations:      Physical Exam:    VS:  BP 140/84 (BP Location: Left Arm, Patient Position: Sitting, Cuff Size: Normal)    Pulse 95    Ht 6' (1.829 m)    Wt 164 lb (74.4 kg)    SpO2 97%    BMI 22.24 kg/m     Wt Readings from Last 3 Encounters:  12/06/21 164 lb (74.4 kg)  07/28/21 180 lb 6.4 oz (81.8 kg)  07/07/21 184 lb 11.2 oz (83.8 kg)     GEN:  Well nourished, well  developed in no acute distress HEENT: Normal NECK: No JVD; No carotid bruits CARDIAC: RRR, no murmurs, rubs, gallops RESPIRATORY:  Clear to auscultation without rales, wheezing or rhonchi  ABDOMEN: Soft, non-tender, non-distended MUSCULOSKELETAL:  No edema; No deformity  SKIN: Warm and dry NEUROLOGIC:  Alert and oriented x 3 PSYCHIATRIC:  Normal affect   ASSESSMENT:    1. NICM (nonischemic cardiomyopathy) ([place])   2. S/P implantation of automatic cardioverter/defibrillator (AICD)   3. Essential hypertension   4. Pure hypercholesterolemia   5. Smoking     PLAN:  In order of problems listed above:  Nonischemic cardiomyopathy, EF < 20%.  Last echo 12/02/2021 with EF improved 40 to 45%.  NYHA class II symptoms, appears euvolemic.  Continue Entresto to 97-103 mg twice daily, Toprol-XL to 100 mg daily,  Aldactone 13m daily.  Okay to take Lasix as needed. AICD implant. Keep appointment with EP/device clinic for monitoring. Hypertension, BP elevated today, usually controlled.  Continue current meds as above. Hyperlipidemia, cholesterol controlled, continue Lipitor. Current smoker, smoking cessation again recommended.  Follow-up in 6 months.   Medication Adjustments/Labs and Tests Ordered: Current medicines are reviewed at length with the patient today.  Concerns regarding medicines are outlined above.  No orders of the defined types were placed in this encounter.  Meds ordered this encounter  Medications   atorvastatin (LIPITOR) 80 MG tablet    Sig: TAKE ONE TABLET BY MOUTH EVERY DAY    Dispense:  30 tablet    Refill:  5   empagliflozin ([name]) 10 MG TABS tablet    Sig: Take 1 tablet (10 mg total) by mouth once daily before breakfast.    Dispense:  90 tablet    Refill:  1   furosemide (LASIX) 20 MG tablet    Sig: TAKE ONE TABLET BY MOUTH ONCE EVERY DAY.    Dispense:  30 tablet    Refill:  5   metoprolol succinate (TOPROL-XL) 100 MG 24 hr tablet    Sig: TAKE ONE TABLET  BY MOUTH ONCE DAILY. (TAKE WITH OR IMMEDIATELY FOLLOWING A MEAL).    Dispense:  30 tablet    Refill:  5   sacubitril-valsartan (ENTRESTO) 97-103 MG    Sig: TAKE ONE TABLET BY MOUTH 2 TIMES A DAY    Dispense:  180 tablet    Refill:  1   spironolactone (ALDACTONE) 25 MG tablet    Sig: Take 1 tablet (25 mg total) by mouth once daily.    Dispense:  30 tablet    Refill:  5    [facility]    Patient Instructions  Medication Instructions:   Your physician recommends that you continue on your current medications as directed. Please refer to the Current Medication list given to you today.  *If you need a refill on your cardiac medications before your next appointment, please call your pharmacy*   Lab Work: None ordered If you have labs (blood work) drawn today and your tests are completely normal, you will receive your results only by: [place](if you have MyChart) OR A paper copy in the mail If you have any lab test that is abnormal or we need to change your treatment, we will call you to review the results.   Testing/Procedures: None ordered   Follow-Up: [facility].  As part of our continuing mission to provide you with exceptional heart care, we have created designated Provider Care Teams.  These Care Teams include your primary Cardiologist (physician) and Advanced Practice Providers (APPs -  Physician Assistants and Nurse Practitioners) who all work together to provide you with the care you need, when you need it.  We recommend signing up for the patient portal called "MyChart".  Sign up information is provided on this After Visit Summary.  MyChart is used to connect with patients for Virtual Visits (Telemedicine).  Patients are able to view lab/test results, encounter notes, upcoming appointments, etc.  Non-urgent messages can be sent to your provider as well.  To learn more about what you can do  with MyChart, go to [email or web address].    Your next appointment:   6 month(s)  The format for your next appointment:   In Person  Provider:   You may see Kate Sable, MD or one of the following Advanced Practice Providers on your designated Care Team:   Murray Hodgkins, NP Christell Faith, PA-C Cadence Kathlen Mody, [place]    Other Instructions        Signed, Kate Sable, MD  12/06/2021 12:38 PM    [place]

## 2021-12-06 NOTE — Patient Instructions (Signed)
Medication Instructions:  ? ?Your physician recommends that you continue on your current medications as directed. Please refer to the Current Medication list given to you today. ? ?*If you need a refill on your cardiac medications before your next appointment, please call your pharmacy* ? ? ?Lab Work: ? ?None ordered ? ?If you have labs (blood work) drawn today and your tests are completely normal, you will receive your results only by: ?MyChart Message (if you have MyChart) OR ?A paper copy in the mail ?If you have any lab test that is abnormal or we need to change your treatment, we will call you to review the results. ? ? ?Testing/Procedures: ? ?None ordered ? ? ?Follow-Up: ?At [facility], you and your health needs are our priority.  As part of our continuing mission to provide you with exceptional heart care, we have created designated Provider Care Teams.  These Care Teams include your primary Cardiologist (physician) and Advanced Practice Providers (APPs -  Physician Assistants and Nurse Practitioners) who all work together to provide you with the care you need, when you need it. ? ?We recommend signing up for the patient portal called "MyChart".  Sign up information is provided on this After Visit Summary.  MyChart is used to connect with patients for Virtual Visits (Telemedicine).  Patients are able to view lab/test results, encounter notes, upcoming appointments, etc.  Non-urgent messages can be sent to your provider as well.   ?To learn more about what you can do with MyChart, go to [email or web address].   ? ?Your next appointment:   ?6 month(s) ? ?The format for your next appointment:   ?In Person ? ?Provider:   ?You may see Brian Agbor-Etang, MD or one of the following Advanced Practice Providers on your designated Care Team:   ?Christopher Berge, NP ?Ryan Dunn, PA-C ?Cadence Furth, PA-C  ? ? ?Other Instructions ? ? ?

## 2021-12-10 DIAGNOSIS — R109 Unspecified abdominal pain: Secondary | ICD-10-CM | POA: Insufficient documentation

## 2021-12-10 DIAGNOSIS — K5900 Constipation, unspecified: Secondary | ICD-10-CM | POA: Insufficient documentation

## 2021-12-10 DIAGNOSIS — Z5321 Procedure and treatment not carried out due to patient leaving prior to being seen by health care provider: Secondary | ICD-10-CM | POA: Insufficient documentation

## 2021-12-10 LAB — CBC WITH DIFFERENTIAL/PLATELET
Abs Immature Granulocytes: 0.02 10*3/uL (ref 0.00–0.07)
Basophils Absolute: 0 10*3/uL (ref 0.0–0.1)
Basophils Relative: 0 %
Eosinophils Absolute: 0 10*3/uL (ref 0.0–0.5)
Eosinophils Relative: 0 %
HCT: 38.2 % — ABNORMAL LOW (ref 39.0–52.0)
Hemoglobin: 13 g/dL (ref 13.0–17.0)
Immature Granulocytes: 0 %
Lymphocytes Relative: 15 %
Lymphs Abs: 1.1 10*3/uL (ref 0.7–4.0)
MCH: 31.3 pg (ref 26.0–34.0)
MCHC: 34 g/dL (ref 30.0–36.0)
MCV: 92 fL (ref 80.0–100.0)
Monocytes Absolute: 0.3 10*3/uL (ref 0.1–1.0)
Monocytes Relative: 5 %
Neutro Abs: 5.7 10*3/uL (ref 1.7–7.7)
Neutrophils Relative %: 80 %
Platelets: 223 10*3/uL (ref 150–400)
RBC: 4.15 MIL/uL — ABNORMAL LOW (ref 4.22–5.81)
RDW: 12.8 % (ref 11.5–15.5)
WBC: 7.2 10*3/uL (ref 4.0–10.5)
nRBC: 0 % (ref 0.0–0.2)

## 2021-12-10 NOTE — ED Triage Notes (Signed)
Pt presents to the ED with complaints of Abdominal pain and constipation. He states that he hasnt had a BM in 4 days, he has tried citrate magnesium and prune juice without improvement. Denies N/V, CP, or SOB.

## 2021-12-10 NOTE — ED Notes (Signed)
Patient to triage via EMS.  Patient with abdominal pain for 2 days, no BM for 5 days.  EMS interventions -- saline loc via 22 g angiocath to left hank, Fentanyl 50mg  IV, p 83, bp 157/90, pulse oxi 93% on room air, cbg 181.

## 2021-12-11 ENCOUNTER — Emergency Department
Admission: EM | Admit: 2021-12-11 | Discharge: 2021-12-11 | Disposition: A | Discharge status: Home / Self Care | Payer: Medicaid Other | Attending: Emergency Medicine | Admitting: Emergency Medicine

## 2021-12-11 NOTE — ED Notes (Signed)
Unable to locate patient for vital signs and redraw of labs.  Patient's bracelet found in empty chair.

## 2021-12-13 ENCOUNTER — Other Ambulatory Visit: Payer: Self-pay

## 2021-12-13 MED ORDER — POLYETHYLENE GLYCOL 3350 17 G PO PACK
PACK | ORAL | 0 refills | Status: AC
  Filled 2021-12-15: qty 30, 10d supply, fill #0

## 2021-12-13 MED ORDER — ONDANSETRON 4 MG PO TBDP
ORAL_TABLET | ORAL | 0 refills | Status: AC
  Filled 2021-12-15: qty 10, 7d supply, fill #0

## 2021-12-15 ENCOUNTER — Other Ambulatory Visit: Payer: Self-pay

## 2021-12-16 ENCOUNTER — Other Ambulatory Visit: Payer: Self-pay

## 2022-01-11 ENCOUNTER — Other Ambulatory Visit: Payer: Self-pay

## 2022-01-12 ENCOUNTER — Ambulatory Visit (INDEPENDENT_AMBULATORY_CARE_PROVIDER_SITE_OTHER): Payer: Self-pay

## 2022-01-12 DIAGNOSIS — I428 Other cardiomyopathies: Secondary | ICD-10-CM

## 2022-01-13 LAB — CUP PACEART REMOTE DEVICE CHECK
Battery Remaining Longevity: 78 mo
Battery Remaining Percentage: 73 %
Battery Voltage: 2.98 V
Brady Statistic AP VP Percent: 1 %
Brady Statistic AP VS Percent: 1.3 %
Brady Statistic AS VP Percent: 1 %
Brady Statistic AS VS Percent: 98 %
Brady Statistic RA Percent Paced: 1 %
Brady Statistic RV Percent Paced: 1 %
Date Time Interrogation Session: 20230216110216
HighPow Impedance: 70 Ohm
Implantable Lead Implant Date: 20201023
Implantable Lead Implant Date: 20211023
Implantable Lead Location: 753859
Implantable Lead Location: 753860
Implantable Pulse Generator Implant Date: 20201023
Lead Channel Impedance Value: 380 Ohm
Lead Channel Impedance Value: 440 Ohm
Lead Channel Pacing Threshold Amplitude: 0.75 V
Lead Channel Pacing Threshold Amplitude: 1 V
Lead Channel Pacing Threshold Pulse Width: 0.5 ms
Lead Channel Pacing Threshold Pulse Width: 0.5 ms
Lead Channel Sensing Intrinsic Amplitude: 12 mV
Lead Channel Sensing Intrinsic Amplitude: 5 mV
Lead Channel Setting Pacing Amplitude: 1 V
Lead Channel Setting Pacing Amplitude: 2 V
Lead Channel Setting Pacing Pulse Width: 0.5 ms
Lead Channel Setting Sensing Sensitivity: 0.5 mV
Pulse Gen Serial Number: 111007884

## 2022-01-17 NOTE — Progress Notes (Signed)
Remote ICD transmission.   

## 2022-03-01 ENCOUNTER — Telehealth (HOSPITAL_BASED_OUTPATIENT_CLINIC_OR_DEPARTMENT_OTHER): Payer: Self-pay

## 2022-03-01 NOTE — Telephone Encounter (Signed)
Referral received: Called patient again to schedule consultation, received voicemail. Left voicemail for patient to call us back to schedule consultation at there earliest  convenience.    Call Center: If patient calls back, okay to schedule using decision tree.

## 2022-04-13 ENCOUNTER — Ambulatory Visit (INDEPENDENT_AMBULATORY_CARE_PROVIDER_SITE_OTHER): Payer: Self-pay

## 2022-04-13 DIAGNOSIS — I428 Other cardiomyopathies: Secondary | ICD-10-CM

## 2022-04-14 LAB — CUP PACEART REMOTE DEVICE CHECK
Battery Remaining Longevity: 76 mo
Battery Remaining Percentage: 71 %
Battery Voltage: 2.96 V
Brady Statistic AP VP Percent: 1 %
Brady Statistic AP VS Percent: 1.1 %
Brady Statistic AS VP Percent: 1 %
Brady Statistic AS VS Percent: 99 %
Brady Statistic RA Percent Paced: 1 %
Brady Statistic RV Percent Paced: 1 %
Date Time Interrogation Session: 20230518101858
HighPow Impedance: 66 Ohm
Implantable Lead Implant Date: 20201023
Implantable Lead Implant Date: 20211023
Implantable Lead Location: 753859
Implantable Lead Location: 753860
Implantable Pulse Generator Implant Date: 20201023
Lead Channel Impedance Value: 380 Ohm
Lead Channel Impedance Value: 410 Ohm
Lead Channel Pacing Threshold Amplitude: 0.875 V
Lead Channel Pacing Threshold Amplitude: 0.875 V
Lead Channel Pacing Threshold Pulse Width: 0.5 ms
Lead Channel Pacing Threshold Pulse Width: 0.5 ms
Lead Channel Sensing Intrinsic Amplitude: 12 mV
Lead Channel Sensing Intrinsic Amplitude: 5 mV
Lead Channel Setting Pacing Amplitude: 1.125
Lead Channel Setting Pacing Amplitude: 1.875
Lead Channel Setting Pacing Pulse Width: 0.5 ms
Lead Channel Setting Sensing Sensitivity: 0.5 mV
Pulse Gen Serial Number: 111007884

## 2022-04-27 NOTE — Progress Notes (Signed)
Remote ICD transmission.   

## 2022-06-16 IMAGING — CT CT HEAD W/O CM
3 series · 15 of 47 positions shown, 18 images · non-contrast
Comparison: None.

CLINICAL DATA: Minor head trauma with normal mental status.

EXAM:
CT HEAD WITHOUT CONTRAST
CT CERVICAL SPINE WITHOUT CONTRAST
TECHNIQUE: Multidetector CT imaging of the head and cervical spine was
performed following the standard protocol without intravenous
contrast. Multiplanar CT image reconstructions of the cervical spine
were also generated.

[Series 3: head wo · axial · 0.45mm/px · z∈[-114,+11]mm · 9 of 30 slices shown, 12 images]
[im 3/30  brain]
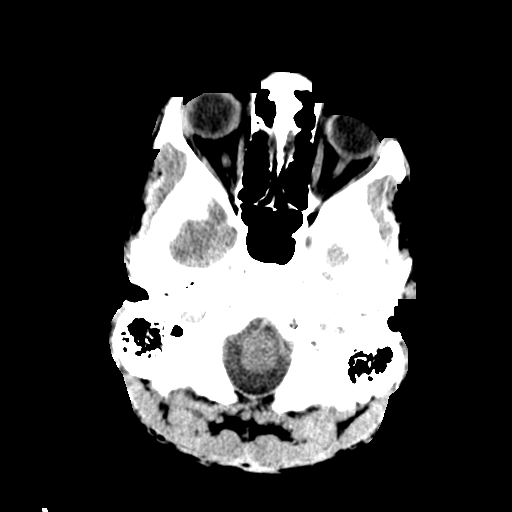
[im 3/30  bone]
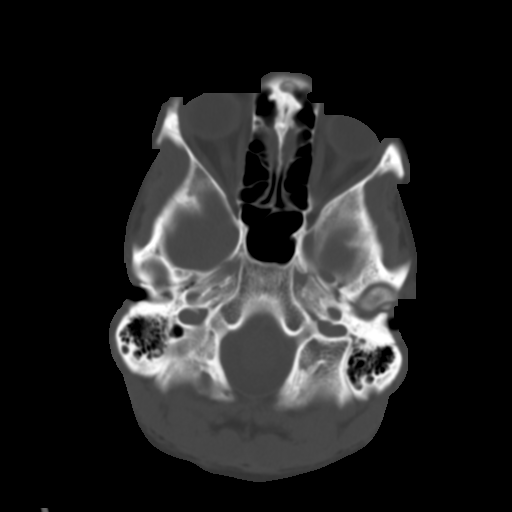
[im 6/30  brain]
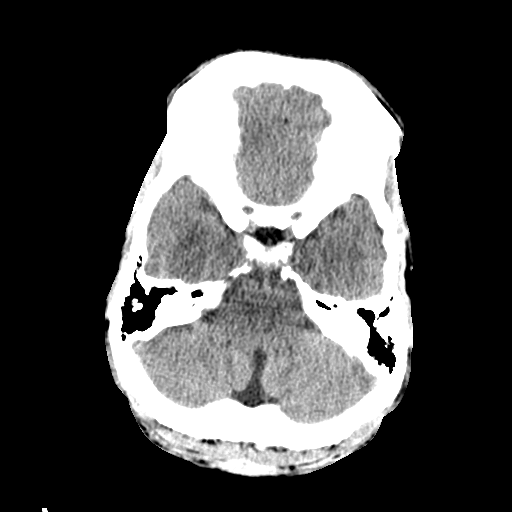
[im 9/30  brain]
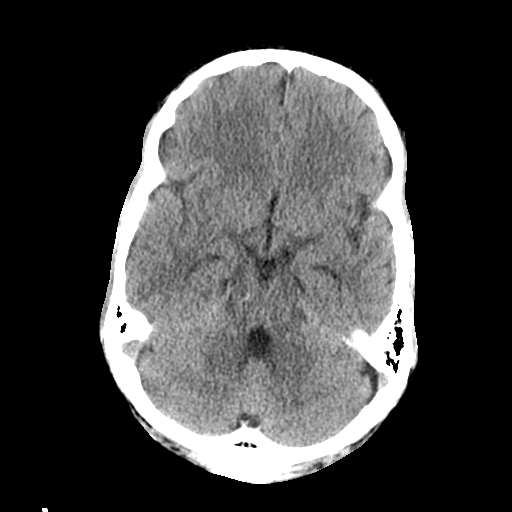
[im 12/30  brain]
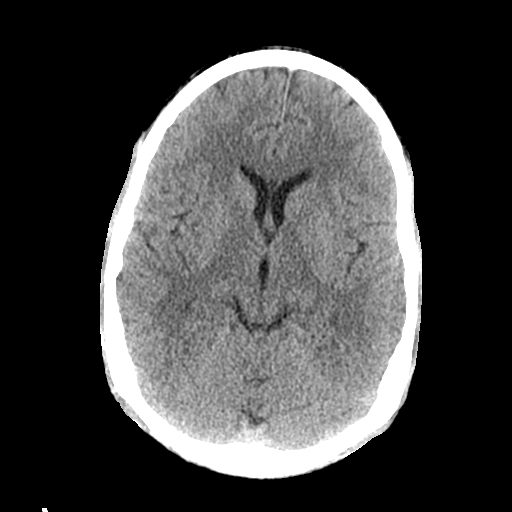
[im 16/30  brain]
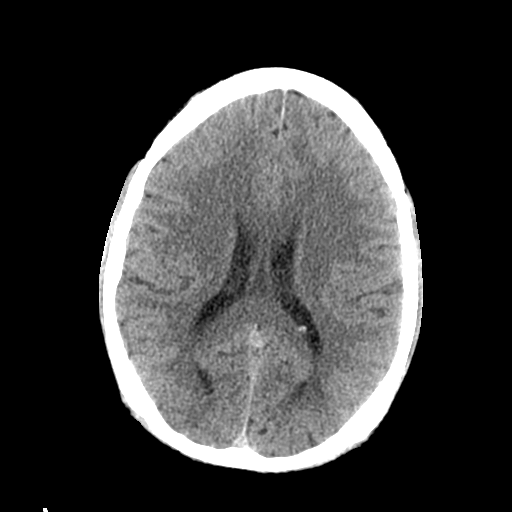
[im 16/30  bone]
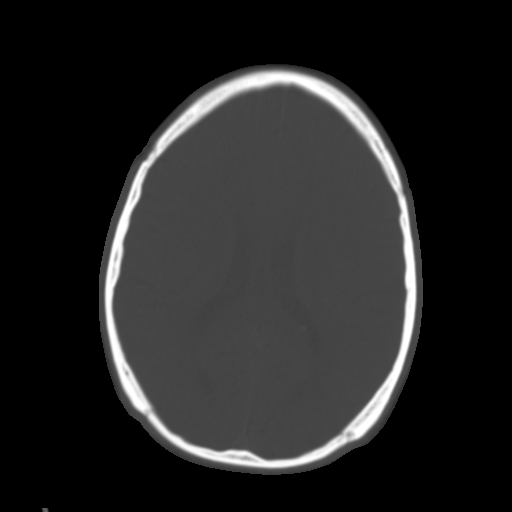
[im 19/30  brain]
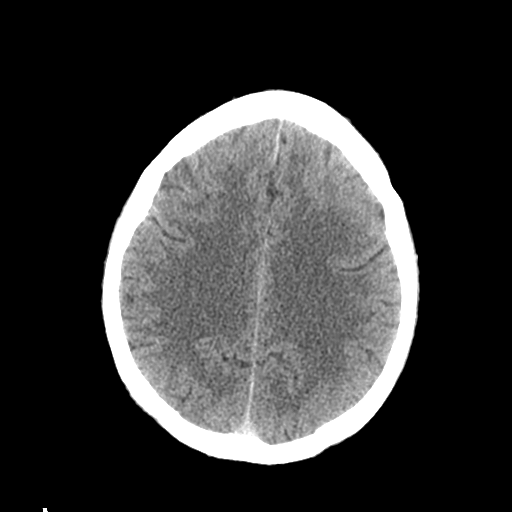
[im 22/30  brain]
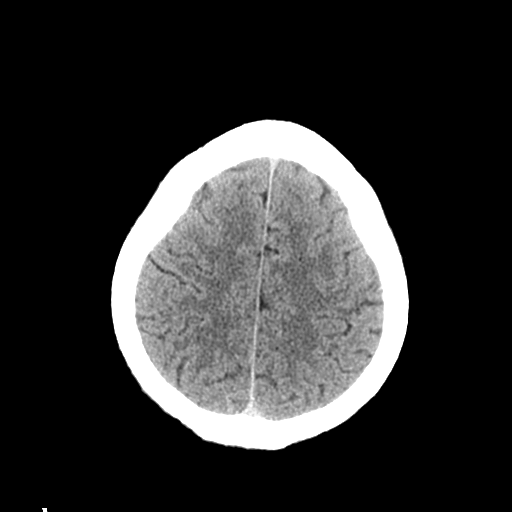
[im 25/30  brain]
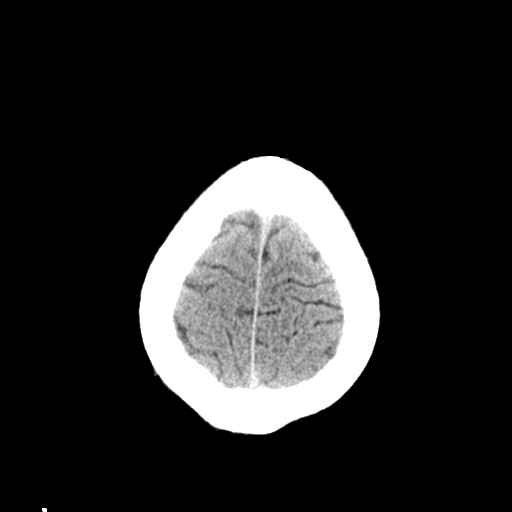
[im 28/30  brain]
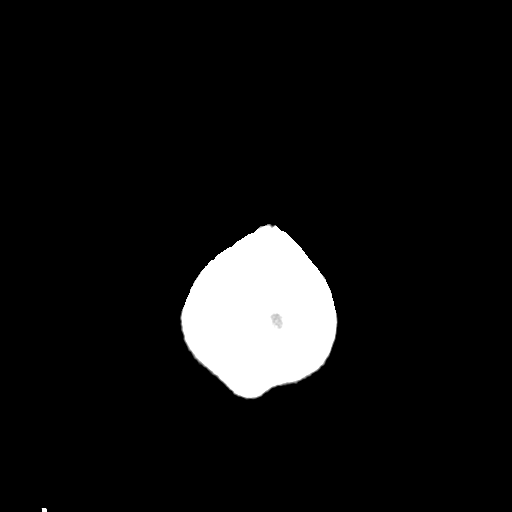
[im 28/30  bone]
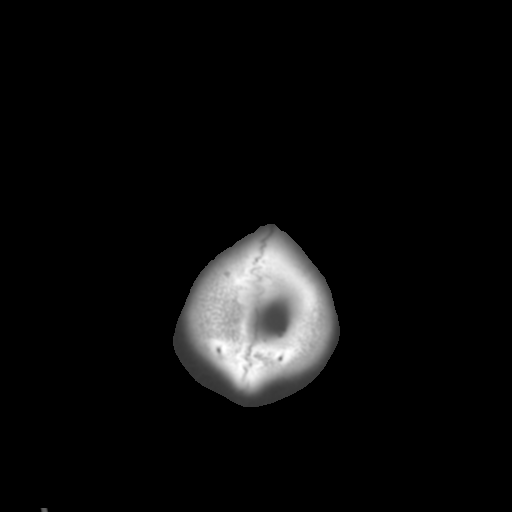

[Series 4: coronal soft tissue · coronal · 0.34mm/px · 3 of 67 slices shown]
[im 23/67  brain]
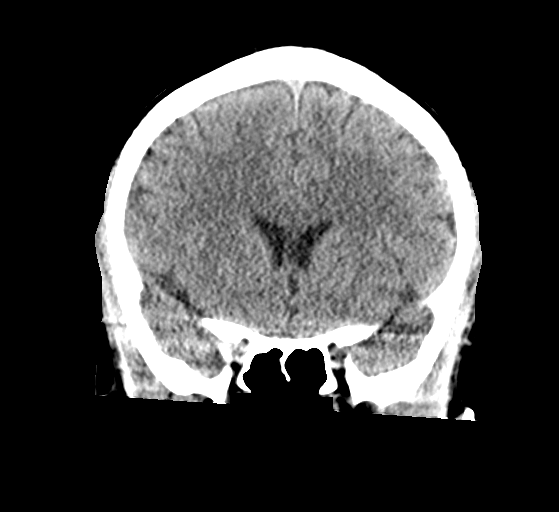
[im 30/67  brain]
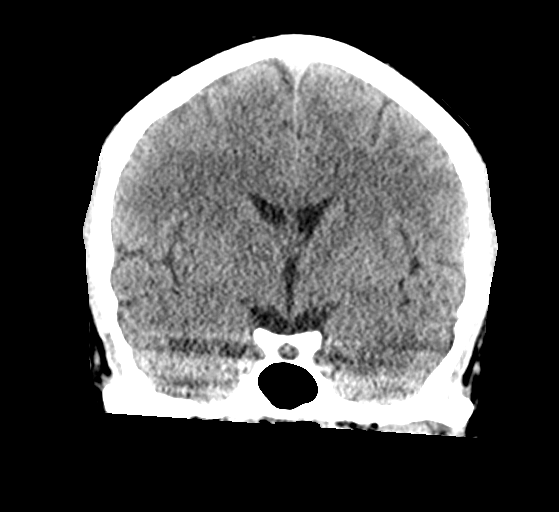
[im 37/67  brain]
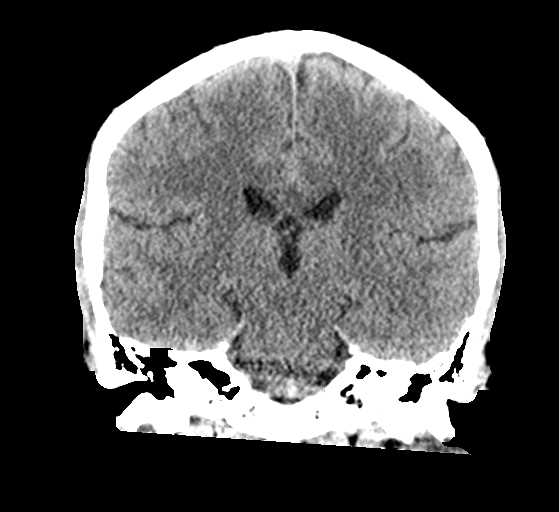

[Series 5: sagittal soft tissue · sagittal · 0.34mm/px · 3 of 54 slices shown]
[im 18/54  brain]
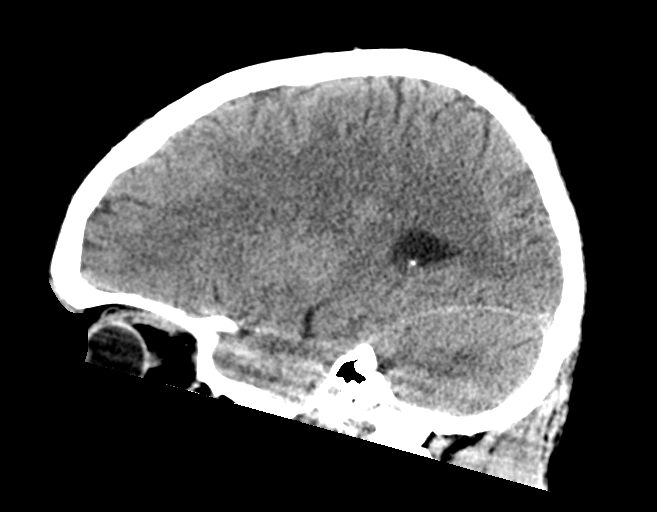
[im 27/54  brain]
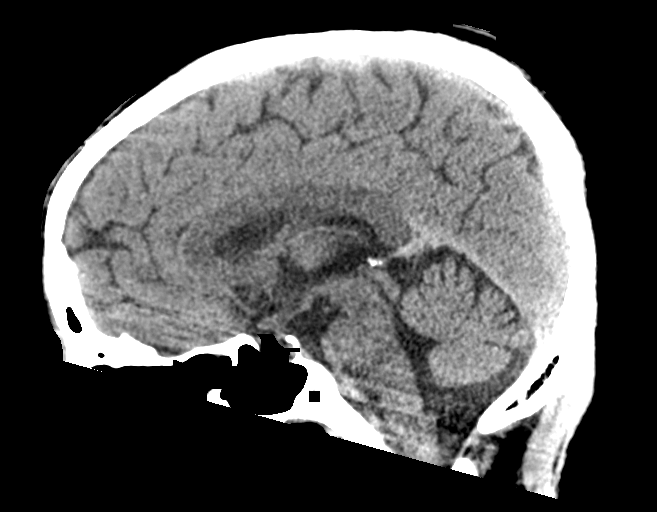
[im 36/54  brain]
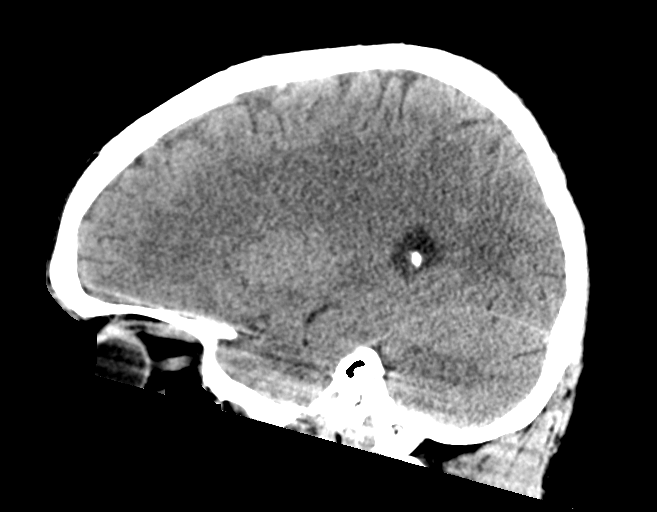

[15 of 47 positions shown; findings below may reference images not displayed]

FINDINGS: CT HEAD FINDINGS

Brain: There is no evidence for acute hemorrhage, hydrocephalus,
mass lesion, or abnormal extra-axial fluid collection. No definite
CT evidence for acute infarction.

Vascular: No hyperdense vessel or unexpected calcification.

Skull: No evidence for fracture. No worrisome lytic or sclerotic
lesion.

Sinuses/Orbits: The visualized paranasal sinuses and mastoid air
cells are clear. Visualized portions of the globes and intraorbital
fat are unremarkable.

Other: None.

CT CERVICAL SPINE FINDINGS

Alignment: Straightening of normal cervical lordosis evident.

Skull base and vertebrae: No acute fracture. No primary bone lesion
or focal pathologic process.

Soft tissues and spinal canal: No prevertebral fluid or swelling. No
visible canal hematoma.

Disc levels: Mild degenerative changes at C6-7. Otherwise
unremarkable.

Upper chest: Negative.

Other: None.
IMPRESSION: 1. Unremarkable CT evaluation of the brain. No acute intracranial
abnormality.
2. No cervical spine fracture.
3. Loss of cervical lordosis. This can be related to patient
positioning, muscle spasm or soft tissue injury.

## 2022-07-13 ENCOUNTER — Ambulatory Visit (INDEPENDENT_AMBULATORY_CARE_PROVIDER_SITE_OTHER): Payer: Self-pay

## 2022-07-13 DIAGNOSIS — I428 Other cardiomyopathies: Secondary | ICD-10-CM

## 2022-07-15 LAB — CUP PACEART REMOTE DEVICE CHECK
Battery Remaining Longevity: 73 mo
Battery Remaining Percentage: 69 %
Battery Voltage: 2.96 V
Brady Statistic AP VP Percent: 1 %
Brady Statistic AP VS Percent: 1 %
Brady Statistic AS VP Percent: 1 %
Brady Statistic AS VS Percent: 99 %
Brady Statistic RA Percent Paced: 1 %
Brady Statistic RV Percent Paced: 1 %
Date Time Interrogation Session: 20230817105600
HighPow Impedance: 70 Ohm
Implantable Lead Implant Date: 20201023
Implantable Lead Implant Date: 20211023
Implantable Lead Location: 753859
Implantable Lead Location: 753860
Implantable Pulse Generator Implant Date: 20201023
Lead Channel Impedance Value: 340 Ohm
Lead Channel Impedance Value: 480 Ohm
Lead Channel Pacing Threshold Amplitude: 0.75 V
Lead Channel Pacing Threshold Amplitude: 1 V
Lead Channel Pacing Threshold Pulse Width: 0.5 ms
Lead Channel Pacing Threshold Pulse Width: 0.5 ms
Lead Channel Sensing Intrinsic Amplitude: 12 mV
Lead Channel Sensing Intrinsic Amplitude: 5 mV
Lead Channel Setting Pacing Amplitude: 1 V
Lead Channel Setting Pacing Amplitude: 2 V
Lead Channel Setting Pacing Pulse Width: 0.5 ms
Lead Channel Setting Sensing Sensitivity: 0.5 mV
Pulse Gen Serial Number: 111007884

## 2022-08-03 ENCOUNTER — Telehealth: Payer: Self-pay | Admitting: Cardiology

## 2022-08-03 NOTE — Telephone Encounter (Signed)
Spoke with patient he stated he recently relocated to [place].

## 2022-08-12 NOTE — Progress Notes (Signed)
Remote ICD transmission.   

## 2022-09-20 ENCOUNTER — Encounter (INDEPENDENT_AMBULATORY_CARE_PROVIDER_SITE_OTHER): Payer: Self-pay | Admitting: Family Medicine

## 2022-09-20 DIAGNOSIS — Z1211 Encounter for screening for malignant neoplasm of colon: Secondary | ICD-10-CM

## 2022-09-27 ENCOUNTER — Telehealth (INDEPENDENT_AMBULATORY_CARE_PROVIDER_SITE_OTHER): Payer: Self-pay

## 2022-09-27 NOTE — Telephone Encounter (Signed)
Routing to Admin - Pt needs cardiac clearance prior to scheduling screening colonoscopy.    Please let referring provider know to reorder colonoscopy when pt has been cleared by cardiology.

## 2022-10-12 ENCOUNTER — Ambulatory Visit: Payer: Medicaid Other

## 2022-10-24 NOTE — Telephone Encounter (Signed)
Spoke with patient and advised cardiac clearance is necessary prior to scheduling procedure referral. Patient stated he will have clearance sent to [place]. Mychart message has been sent to the patient with our fax information. Form will be uploaded when clearance is received.

## 2022-10-27 NOTE — Telephone Encounter (Signed)
Received a call from patient regarding request to send a caridiac clearance to Cardiology [facility] and provided FAX: [phone]. I faxed clearance and advised patient to follow up in 3 to 5 business days for response. Patient had no further questions at this time. Thank you!

## 2022-11-02 NOTE — Telephone Encounter (Signed)
Cardiac clearance received.  Referral sent to [phone] for scheduling

## 2022-11-02 NOTE — Telephone Encounter (Signed)
Received cardio clearance letter by fax and have imported into media file.

## 2022-11-03 ENCOUNTER — Other Ambulatory Visit (INDEPENDENT_AMBULATORY_CARE_PROVIDER_SITE_OTHER): Payer: Self-pay

## 2022-11-03 ENCOUNTER — Telehealth (INDEPENDENT_AMBULATORY_CARE_PROVIDER_SITE_OTHER): Payer: Self-pay

## 2022-11-03 DIAGNOSIS — Z1211 Encounter for screening for malignant neoplasm of colon: Secondary | ICD-10-CM

## 2022-11-03 MED ORDER — GOLYTELY 236 GM OR SOLR
4.0000 L | Freq: Once | ORAL | 0 refills | Status: AC
Start: 2022-11-03 — End: 2022-11-03

## 2022-11-03 NOTE — Telephone Encounter (Signed)
Patient sent prep instructions

## 2022-11-03 NOTE — Telephone Encounter (Signed)
Golytely pended and routed for provider review and approval/denial

## 2022-11-03 NOTE — Addendum Note (Signed)
Addended by: Lucrezia Europe on: 11/03/2022 11:23 AM     Modules accepted: Orders

## 2022-11-03 NOTE — Telephone Encounter (Signed)
Reviewed allergies listed in banner with the patient: yes  Do you have any additional known allergies to over the counter or prescription medications? Yes   If yes, please list additional medication(s) Penicillin and explain reaction(s)Skin irritation  Are you allergic to latex? No    Patient is scheduled on 03/29/2023 with Dr. Louretta Parma. Please pend order for bowel prep and route to provider for review.     Is the patient requesting a low volume prep no    Patient's preferred pharmacy:       [facility] - [place], [place] - [address] [facility]  [address]  [place] [address]  Phone: [phone] Fax: [phone]

## 2022-11-07 ENCOUNTER — Telehealth (INDEPENDENT_AMBULATORY_CARE_PROVIDER_SITE_OTHER): Payer: Self-pay

## 2022-11-07 NOTE — Telephone Encounter (Signed)
Received call from patient. He got a call no message. Removed procedure order from workqueue due to patient being scheduled and requesting to have no automated calls because he gets nervous and anxious if he sees a missed call from [place] and its nothing.

## 2022-12-27 ENCOUNTER — Ambulatory Visit: Payer: Medicaid Other

## 2022-12-27 DIAGNOSIS — I428 Other cardiomyopathies: Secondary | ICD-10-CM

## 2022-12-27 LAB — CUP PACEART REMOTE DEVICE CHECK
Battery Remaining Longevity: 68 mo
Battery Remaining Percentage: 64 %
Battery Voltage: 2.96 V
Brady Statistic AP VP Percent: 1 %
Brady Statistic AP VS Percent: 1 %
Brady Statistic AS VP Percent: 1 %
Brady Statistic AS VS Percent: 99 %
Brady Statistic RA Percent Paced: 1 %
Brady Statistic RV Percent Paced: 1 %
Date Time Interrogation Session: 20240129215043
HighPow Impedance: 72 Ohm
Implantable Lead Connection Status: 753985
Implantable Lead Connection Status: 753985
Implantable Lead Implant Date: 20201023
Implantable Lead Implant Date: 20211023
Implantable Lead Location: 753859
Implantable Lead Location: 753860
Implantable Pulse Generator Implant Date: 20201023
Lead Channel Impedance Value: 380 Ohm
Lead Channel Impedance Value: 500 Ohm
Lead Channel Pacing Threshold Amplitude: 0.75 V
Lead Channel Pacing Threshold Amplitude: 1.125 V
Lead Channel Pacing Threshold Pulse Width: 0.5 ms
Lead Channel Pacing Threshold Pulse Width: 0.5 ms
Lead Channel Sensing Intrinsic Amplitude: 12 mV
Lead Channel Sensing Intrinsic Amplitude: 5 mV
Lead Channel Setting Pacing Amplitude: 1 V
Lead Channel Setting Pacing Amplitude: 2.125
Lead Channel Setting Pacing Pulse Width: 0.5 ms
Lead Channel Setting Sensing Sensitivity: 0.5 mV
Pulse Gen Serial Number: 111007884
Zone Setting Status: 755011

## 2023-01-24 ENCOUNTER — Telehealth (INDEPENDENT_AMBULATORY_CARE_PROVIDER_SITE_OTHER): Payer: Self-pay

## 2023-01-24 NOTE — Telephone Encounter (Signed)
Check in time has changed to 9:15am. Letter updated and sent to the patient.

## 2023-01-24 NOTE — Progress Notes (Signed)
Remote ICD transmission.   

## 2023-03-22 ENCOUNTER — Telehealth (INDEPENDENT_AMBULATORY_CARE_PROVIDER_SITE_OTHER): Payer: Self-pay | Admitting: Gastroenterology

## 2023-03-22 NOTE — Telephone Encounter (Signed)
Pre procedure mychart sent

## 2023-03-24 DIAGNOSIS — F12188 Cannabis abuse with other cannabis-induced disorder: Secondary | ICD-10-CM | POA: Insufficient documentation

## 2023-03-24 DIAGNOSIS — F32A Depression, unspecified: Secondary | ICD-10-CM | POA: Insufficient documentation

## 2023-03-24 DIAGNOSIS — N179 Acute kidney failure, unspecified: Secondary | ICD-10-CM | POA: Insufficient documentation

## 2023-03-24 DIAGNOSIS — I5020 Unspecified systolic (congestive) heart failure: Secondary | ICD-10-CM | POA: Insufficient documentation

## 2023-03-24 DIAGNOSIS — F1410 Cocaine abuse, uncomplicated: Secondary | ICD-10-CM | POA: Insufficient documentation

## 2023-03-25 DIAGNOSIS — D649 Anemia, unspecified: Secondary | ICD-10-CM | POA: Insufficient documentation

## 2023-03-27 MED ORDER — GOLYTELY 236 GM OR SOLR
4.0000 L | Freq: Once | ORAL | 0 refills | Status: AC
Start: 2023-03-27 — End: 2023-03-27

## 2023-03-27 NOTE — Addendum Note (Signed)
Addended by: Lucrezia Europe on: 03/27/2023 09:39 AM     Modules accepted: Orders

## 2023-03-27 NOTE — Telephone Encounter (Signed)
Golytely pended and routed for provider review and approval/denial

## 2023-03-27 NOTE — Telephone Encounter (Signed)
Patient would like bowel prep to be sent to due to denial at previous pharmacy it was sent too      [id number] - , [place] - [address] [facility] [address]

## 2023-03-28 ENCOUNTER — Ambulatory Visit: Payer: Medicaid Other

## 2023-03-29 ENCOUNTER — Ambulatory Visit (HOSPITAL_BASED_OUTPATIENT_CLINIC_OR_DEPARTMENT_OTHER): Payer: Medicaid Other | Admitting: Certified Registered"

## 2023-03-29 ENCOUNTER — Encounter (HOSPITAL_BASED_OUTPATIENT_CLINIC_OR_DEPARTMENT_OTHER): Payer: Self-pay | Admitting: Gastroenterology

## 2023-03-29 ENCOUNTER — Ambulatory Visit
Admission: RE | Admit: 2023-03-29 | Discharge: 2023-03-29 | Disposition: A | Discharge status: Home / Self Care | Payer: Medicaid Other | Attending: Gastroenterology | Admitting: Gastroenterology

## 2023-03-29 ENCOUNTER — Encounter (HOSPITAL_BASED_OUTPATIENT_CLINIC_OR_DEPARTMENT_OTHER): Admission: RE | Disposition: A | Discharge status: Home Health | Payer: Self-pay | Attending: Gastroenterology

## 2023-03-29 DIAGNOSIS — Z1211 Encounter for screening for malignant neoplasm of colon: Secondary | ICD-10-CM

## 2023-03-29 DIAGNOSIS — E119 Type 2 diabetes mellitus without complications: Secondary | ICD-10-CM

## 2023-03-29 DIAGNOSIS — Z794 Long term (current) use of insulin: Secondary | ICD-10-CM

## 2023-03-29 DIAGNOSIS — I501 Left ventricular failure: Secondary | ICD-10-CM

## 2023-03-29 DIAGNOSIS — D125 Benign neoplasm of sigmoid colon: Secondary | ICD-10-CM

## 2023-03-29 DIAGNOSIS — F32A Depression, unspecified: Secondary | ICD-10-CM | POA: Insufficient documentation

## 2023-03-29 DIAGNOSIS — K859 Acute pancreatitis without necrosis or infection, unspecified: Secondary | ICD-10-CM | POA: Insufficient documentation

## 2023-03-29 DIAGNOSIS — I10 Essential (primary) hypertension: Secondary | ICD-10-CM | POA: Insufficient documentation

## 2023-03-29 DIAGNOSIS — Z6821 Body mass index (BMI) 21.0-21.9, adult: Secondary | ICD-10-CM | POA: Insufficient documentation

## 2023-03-29 DIAGNOSIS — Z95810 Presence of automatic (implantable) cardiac defibrillator: Secondary | ICD-10-CM | POA: Insufficient documentation

## 2023-03-29 DIAGNOSIS — I5020 Unspecified systolic (congestive) heart failure: Secondary | ICD-10-CM | POA: Insufficient documentation

## 2023-03-29 DIAGNOSIS — K635 Polyp of colon: Secondary | ICD-10-CM

## 2023-03-29 HISTORY — DX: Type 2 diabetes mellitus without complications (CMS-HCC): E11.9

## 2023-03-29 HISTORY — DX: Essential (primary) hypertension: I10

## 2023-03-29 HISTORY — DX: Presence of automatic (implantable) cardiac defibrillator: Z95.810

## 2023-03-29 LAB — GLUCOSE (POCT): Glucose (POCT): 112 mg/dL — ABNORMAL HIGH (ref 70–99)

## 2023-03-29 SURGERY — COLONOSCOPY
Anesthesia: Monitored Anesthesia Care (MAC)

## 2023-03-29 MED ORDER — PROPOFOL 1000 MG/100ML IV EMUL
INTRAVENOUS | Status: DC | PRN
Start: 2023-03-29 — End: 2023-03-29
  Administered 2023-03-29: 250 ug/kg/min via INTRAVENOUS
  Administered 2023-03-29: 200 ug/kg/min via INTRAVENOUS

## 2023-03-29 MED ORDER — METOPROLOL TARTRATE 50 MG OR TABS: 50.00 mg | ORAL_TABLET | Freq: Every day | ORAL | Status: AC

## 2023-03-29 MED ORDER — LIDOCAINE INFUSION FOR PAIN
INTRAVENOUS | Status: DC | PRN
Start: 2023-03-29 — End: 2023-03-29
  Administered 2023-03-29: 50 mg via INTRAVENOUS

## 2023-03-29 MED ORDER — INSULIN ASPART 100 UNIT/ML IJ SOLN: 10.00 [IU] | Freq: Three times a day (TID) | INTRAMUSCULAR | Status: AC

## 2023-03-29 MED ORDER — PROPOFOL 200 MG/20ML IV EMUL
INTRAVENOUS | Status: DC | PRN
Start: 2023-03-29 — End: 2023-03-29
  Administered 2023-03-29: 70 mg via INTRAVENOUS
  Administered 2023-03-29: 40 mg via INTRAVENOUS

## 2023-03-29 MED ORDER — ESCITALOPRAM OXALATE 5 MG OR TABS: 5.00 mg | ORAL_TABLET | Freq: Every day | ORAL | Status: AC

## 2023-03-29 MED ORDER — SPIRONOLACTONE 50 MG OR TABS: 12.50 mg | ORAL_TABLET | Freq: Every day | ORAL | Status: AC

## 2023-03-29 MED ORDER — LIDOCAINE HCL 2% EX GEL (UROJET)
Status: AC
Start: 2023-03-29 — End: 2023-03-29
  Filled 2023-03-29: qty 10

## 2023-03-29 MED ORDER — MIRTAZAPINE 15 MG OR TABS: 15.00 mg | ORAL_TABLET | Freq: Every evening | ORAL | Status: AC

## 2023-03-29 MED ORDER — INSULIN GLARGINE 100 UNIT/ML SC SOLN: 10.00 [IU] | Freq: Every evening | SUBCUTANEOUS | Status: AC

## 2023-03-29 MED ORDER — LACTATED RINGERS IV SOLN
INTRAVENOUS | Status: DC | PRN
Start: 2023-03-29 — End: 2023-03-29

## 2023-03-29 MED ORDER — SODIUM CHLORIDE 0.9 % IV SOLN
INTRAVENOUS | Status: DC
Start: 2023-03-29 — End: 2023-03-29

## 2023-03-29 MED ORDER — ATORVASTATIN CALCIUM 80 MG OR TABS: 80.00 mg | ORAL_TABLET | Freq: Every day | ORAL | Status: AC

## 2023-03-29 SURGICAL SUPPLY — 4 items
BIOPSY FORCEP RADIAL JAW 4 2.8MM X 240CM, LARGE W/NEEDLE (Needles/punch/cannula/biopsy) ×1 IMPLANT
SNARE COLD CAPTIVATOR 10MM ROUNDED STIFF (Misc Surgical Supply) ×1
SNARE COLD CAPTIVATOR 10MM ROUNDED STIFF 10/BX (Misc Surgical Supply) ×1
TRAP POLYP E TRAP (Misc Medical Supply) ×1 IMPLANT

## 2023-03-29 NOTE — Anesthesia Preprocedure Evaluation (Addendum)
ANESTHESIA PRE-OPERATIVE EVALUATION    Patient Information    Name: Joshua Harper    MRN: [id number]    DOB: [birth year]    Age: 52 year old    Sex: male  Procedure(s):  COLONOSCOPY (Open Access)      Pre-op Vitals:   There were no vitals taken for this visit.        Primary language spoken:  English    ROS/Medical History:      History of Present Illness:  52 y.o.male with past medical history of HFrEF unknown EF s/p AICD placement 2019, recirrent acute pancreatitis, type 2 diabetes on insulin, hypertension, and depression who presented for  colon cancer screening    General:  negative for General ROS   Cardiovascular:  LV failure/CHF, Unstable/EF <40%,   hypertension,  ICD,     Anesthesia History:  negative anesthesia history ROS   Pulmonary:   negative pulmonary ROS     Neuro/Psych:   negative neuro/psych ROS  psychiatric history,   Hematology/Oncology:   hematologic/lymphatic negative      GI/Hepatic:  pancreatic disease,   Infectious Disease:  negative for infectious disease     Renal:   Endocrine/Other:  diabetes, using insulin,      Pregnancy History:   Pediatrics:         Pre Anesthesia Testing (PCC/CPC) notes/comments:                 Physical Exam    Airway:     Inter-inciser distance > 4 cm  Prognanth Able    Mallampati: III  Neck ROM: full  TM distance: > 6 cm  Short thick neck: No          Cardiovascular:  - cardiovascular exam normal             Pulmonary:  - pulmonary exam normal              Neuro/Neck/Skeletal/Skin:  - Neck/Neuro/Skeletal/Skin exam normal          Dental:  - normal exam    Abdominal:   - normal exam           Additional Clinical Notes:           Last OSA (STOP BANG) Score:   No data recorded    No past medical history on file.  No past surgical history on file.  Social History     Socioeconomic History   . Marital status: Single     Alcohol Use: Not on file       No current facility-administered medications for this encounter.     No current outpatient medications on file.     Allergies    Allergen Reactions   . Penicillins Other     Per 11/03/22 TE: Do you have any additional known allergies to over the counter or prescription medications? Yes   If yes, please list additional medication(s) Penicillin and explain reaction(s)Skin irritation       Labs and Other Data  No results found for: "NA", "SODIUM", "K", "CL", "BICARB", "BUN", "CREAT", "GLU", "[place]"  No results found for: "AST", "ALT", "GGT", "LDH", "ALK", "TP", "ALB", "TBILI", "DBILI"  No results found for: "WBC", "RBC", "HGB", "HCT", "MCV", "MCHC", "RDW", "PLT", "PLCTEL", "MPV", "MPVH", "SEG", "LYMPHS", "MONOS", "EOS", "BASOS"  No results found for: "INR", "PTT"  No results found for: "ARTPH", "ARTPO2", "ARTPCO2"    Anesthesia Plan:  Risks and Benefits of Anesthesia  I have personally performed an appropriate pre-anesthesia  physical exam of the patient (including heart, lungs, and airway) prior to the anesthetic and reviewed the pertinent medical history, drug and allergy history, laboratory and imaging studies and consultations.   I have determined that the patient has had adequate assessment and testing.  I have validated the documentation of these elements of the patient exam and/or have made necessary changes to reflect my own observations during my pre-anesthesia exam.  Anesthetic techniques, invasive monitors, anesthetic drugs for induction, maintenance and post-operative analgesia, risks and alternatives have been explained to the patient and/or patient's representatives.    I have prescribed the anesthetic plan:         Planned anesthesia method: Monitored Anesthesia Care         ASA 3 (Severe systemic disease)       No Beta Blocker Indicated:     Planned monitoring method: Routine monitoring    Informed Consent:    Plan discussed with Surgeon, Attending and CRNA.

## 2023-03-29 NOTE — H&P (Signed)
History and Physical    Indication for procedure:  colon cancer screening    Pain Score: 0          Past Medical History:   Diagnosis Date    AICD (automatic cardioverter/defibrillator) present     Diabetes mellitus (CMS-HCC)     Hypertension      Past Surgical History:   Procedure Laterality Date    CARDIAC DEFIBRILLATOR PLACEMENT       Allergies   Allergen Reactions    Penicillins Other     Per 11/03/22 TE: Do you have any additional known allergies to over the counter or prescription medications? Yes   If yes, please list additional medication(s) Penicillin and explain reaction(s)Skin irritation     Prior to Admission Medications   Prescriptions Last Dose Informant Patient Reported? Taking?   PEG 3350 with electrolytes (GOLYTELY) 236 g solution   No No   Sig: Take 4,000 mL (4 L) by mouth once for 1 dose.   atorvastatin (LIPITOR) 80 MG tablet   Yes No   Sig: Take 1 tablet (80 mg) by mouth daily.   escitalopram (LEXAPRO) 5 MG tablet   Yes No   Sig: Take 1 tablet (5 mg) by mouth daily.   insulin aspart (NOVOLOG) 100 UNIT/ML injection   Yes No   Sig: Inject 10-15 Units under the skin 3 times daily (before meals).   insulin glargine (LANTUS) 100 units/mL injection   Yes No   Sig: Inject 10 Units under the skin nightly.   metoprolol tartrate (LOPRESSOR) 50 MG tablet   Yes No   Sig: Take 1 tablet (50 mg) by mouth daily.   mirtazapine (REMERON) 15 MG tablet   Yes No   Sig: Take 1 tablet (15 mg) by mouth nightly.   spironolactone (ALDACTONE) 50 MG tablet   Yes No   Sig: Take 12.5 mg by mouth daily.      Facility-Administered Medications: None       BP 123/84   Pulse 71   Temp 96.8 F (36 C)   Resp 14   Ht 6' (1.829 m)   Wt 72.6 kg (160 lb)   SpO2 100%   BMI 21.70 kg/m   General: Well developed, well nourished, in no apparent distress.  Lungs: Clear breath sounds bilaterally.  CV: Normal rate, regular rhythm, no significant murmur present.  Abdomen: Soft, nontender, normal bowel sounds present.    ASA Score:  3    Airway (Mallimpati) Score:  Class II - Soft palate, uvula, and fauces are visible.    Assessment and Plan  Proceed to planned procedure.    The patient has consented to the procedure, which will be done with sedation.  I have assessed the patient's status immediately prior to this procedure.  I have discussed pain management needs and options for the patient with the patient or caregiver.      The patient agrees to be full code for the duration of the procedure.    Sedation options, risks, and plans have been discussed with the patient or caregiver.  Questions were answered.  The patient or caregiver agrees to proceed as planned.    Lia Hopping

## 2023-03-29 NOTE — RN OR/Procedure Note (Signed)
Went through discharge instructions with patient as well as family/driver. No apparent distress. Provided info about accessing electronic AVS (patient stated did not need printed copy) and MD instructions/follow up info. Discharging via wheelchair to front of hospital.

## 2023-03-29 NOTE — Procedures (Signed)
[facility]  Gastroenterology/Special Procedures    Patient Name: Joshua Harper  Date of Birth: 27-Aug-1971  Record Number: [id number]  Date of Procedure: 03/29/2023  Referring Physician:   Endoscopist: Louellen Molder   Asst. Endoscopist:     Nurse: Sammuel Cooper    PROCEDURE PERFORMED  Colonooscopy - snare polypectomy, biopsy    INDICATIONS FOR EXAMINATION   screening     Instruments:    Medications: MAC               The attending physician, Dr. Louretta Parma, was present for the entire examination.  Procedure Technique: Patient's medications, allergies, past medical, surgical, social and family histories were reviewed and updated as appropriate. A discussion of informed consent was had with the patient and/or the patient's family prior to the   procedure, including sedation.  The alternatives, benefits and risks of the procedure including but not limited to pain, perforation, hemorrhage, infection, adverse drug reaction, missed lesion/incomplete procedure, and aspiration were discussed    Description of Procedure:  Based on the pre-procedure assessment, including review of the patient's medical history, medications, allergies, and review of systems, the patient had been deemed to be an appropriate candidate for sedation; the patient was therefore sedated with the   medications listed. The patient was monitored continuously with pulse oximetry, blood pressure monitoring, and direct observations.  After digital rectal examination, the colonoscope  was inserted into the rectum and advanced under direct vision to the level of the cecum.  The quality of the colonic preparation was .  A careful inspection was made as the colonoscope was withdrawn.    Findings and interventions are described below.  Extent of Exam: cecum.  Biopsy Obtained: Yes.  Complications: .   Estimated Blood Loss: minimal.   Need for Future Anesthesia: MAC.  Cecal Withdrawal Time:  Total Procedure Time:       FINDINGS  fair prep. 5mm cecal polyp removed  with cold snare. 3mm sigmoid colon polyp removed with forceps    ENDOSCOPIC DIAGNOSIS  2 small polyps removed    RECOMMENDATIONS  Repeat colonoscopy in 5 years    Signature:_________________________________ Louellen Molder, M.D.            [phone])    Note:  The final official report is in the [facility] medical record.      This electronic signature authenticates all electronic and/or handwritten documentation, including orders, generated by the signer during the episode of care contained in this record.  03/29/2023 11:06:58 AM By Meyer Cory.D.

## 2023-03-29 NOTE — Anesthesia Postprocedure Evaluation (Signed)
Anesthesia Post Note    Patient: Joshua Harper    Procedure(s) Performed: Procedure(s):  COLONOSCOPY (Open Access)      Final anesthesia type: Monitored Anesthesia Care    Patient location: PACU    Post anesthesia pain: adequate analgesia    Mental status: awake, alert , and oriented    Airway Patent: Yes    Last Vitals:   Vitals Value Taken Time   BP 161/101 03/29/23 1119   Temp 36.1 C 03/29/23 1100   Pulse 76 03/29/23 1119   Resp 20 03/29/23 1119   SpO2 97 % 03/29/23 1119   Vitals shown include unfiled device data.     Post vital signs: stable    Hydration: adequate    N/V:no    Anesthetic complications: no    Plan of care per primary team.

## 2023-03-29 NOTE — Discharge Instructions (Signed)
Post Procedure Instructions:    1) If you received sedation today you will need someone to take you home because it can take up to a full day for the effects of the sedation to wear off. Don't drive, drink alcohol, or go back to work for the rest of the day.    2) You may feel bloated or pass gas for a few hours after the exam, as you clear air from your intestine. Walking may help relieve discomfort.     3) If you had biopsies taken, your endoscopist will instruct you how to obtain the results (I.e. office visit, mychart, by phone, or letter). If you do not receive pathology results within 14 days then call [phone] and ask for your results.     If you have severe symptoms, then go to the nearest emergency room.  If you pass blood from your rectum (greater than 2 tablespoons), you have severe abdominal pain, fever>100F, within 48 hours post-procedure or any other concerns about your procedure then phone the [facility] Nurse/Doctor:  Monday through Friday 8am-5pm [phone]  Nights and weekends [phone] (Hospital operator) and ask to have the Gastroenterology Physician on call contacted who will return your call. ye

## 2023-04-01 DIAGNOSIS — R001 Bradycardia, unspecified: Secondary | ICD-10-CM

## 2023-04-01 DIAGNOSIS — R9431 Abnormal electrocardiogram [ECG] [EKG]: Secondary | ICD-10-CM

## 2023-04-01 DIAGNOSIS — I498 Other specified cardiac arrhythmias: Secondary | ICD-10-CM

## 2023-04-01 LAB — ECG 12-LEAD
ATRIAL RATE: 77 {beats}/min
P AXIS: 78 degrees
PR INTERVAL: 198 ms
QRS INTERVAL/DURATION: 114 ms
QT: 424 ms
QTc (Bazett): 479 ms
QTc (Fredericia): 460 ms
R AXIS: -40 degrees
T AXIS: 79 degrees
VENTRICULAR RATE: 77 {beats}/min

## 2023-04-06 ENCOUNTER — Encounter (INDEPENDENT_AMBULATORY_CARE_PROVIDER_SITE_OTHER): Payer: Self-pay

## 2023-06-27 ENCOUNTER — Ambulatory Visit: Payer: Medicaid Other

## 2023-09-26 ENCOUNTER — Ambulatory Visit: Payer: Medicaid Other

## 2023-10-10 ENCOUNTER — Emergency Department (HOSPITAL_BASED_OUTPATIENT_CLINIC_OR_DEPARTMENT_OTHER): Payer: Medicaid Other

## 2023-10-10 ENCOUNTER — Emergency Department
Admission: EM | Admit: 2023-10-10 | Discharge: 2023-10-10 | Disposition: A | Discharge status: Home / Self Care | Payer: Medicaid Other | Attending: Emergency Medicine | Admitting: Emergency Medicine

## 2023-10-10 ENCOUNTER — Other Ambulatory Visit (HOSPITAL_BASED_OUTPATIENT_CLINIC_OR_DEPARTMENT_OTHER): Payer: Self-pay

## 2023-10-10 ENCOUNTER — Encounter (HOSPITAL_COMMUNITY): Payer: Self-pay

## 2023-10-10 DIAGNOSIS — K5900 Constipation, unspecified: Secondary | ICD-10-CM | POA: Insufficient documentation

## 2023-10-10 DIAGNOSIS — R1013 Epigastric pain: Secondary | ICD-10-CM

## 2023-10-10 DIAGNOSIS — K3184 Gastroparesis: Secondary | ICD-10-CM | POA: Insufficient documentation

## 2023-10-10 DIAGNOSIS — K219 Gastro-esophageal reflux disease without esophagitis: Secondary | ICD-10-CM | POA: Insufficient documentation

## 2023-10-10 DIAGNOSIS — R112 Nausea with vomiting, unspecified: Secondary | ICD-10-CM

## 2023-10-10 DIAGNOSIS — R531 Weakness: Secondary | ICD-10-CM

## 2023-10-10 DIAGNOSIS — Z91128 Patient's intentional underdosing of medication regimen for other reason: Secondary | ICD-10-CM | POA: Insufficient documentation

## 2023-10-10 DIAGNOSIS — R1114 Bilious vomiting: Secondary | ICD-10-CM | POA: Insufficient documentation

## 2023-10-10 DIAGNOSIS — R270 Ataxia, unspecified: Secondary | ICD-10-CM

## 2023-10-10 DIAGNOSIS — E1165 Type 2 diabetes mellitus with hyperglycemia: Secondary | ICD-10-CM | POA: Insufficient documentation

## 2023-10-10 DIAGNOSIS — I10 Essential (primary) hypertension: Secondary | ICD-10-CM

## 2023-10-10 DIAGNOSIS — I429 Cardiomyopathy, unspecified: Secondary | ICD-10-CM | POA: Insufficient documentation

## 2023-10-10 DIAGNOSIS — K859 Acute pancreatitis without necrosis or infection, unspecified: Secondary | ICD-10-CM | POA: Insufficient documentation

## 2023-10-10 DIAGNOSIS — I160 Hypertensive urgency: Secondary | ICD-10-CM | POA: Insufficient documentation

## 2023-10-10 DIAGNOSIS — T383X6A Underdosing of insulin and oral hypoglycemic [antidiabetic] drugs, initial encounter: Secondary | ICD-10-CM | POA: Insufficient documentation

## 2023-10-10 DIAGNOSIS — Z59811 Housing instability, housed, with risk of homelessness: Secondary | ICD-10-CM | POA: Insufficient documentation

## 2023-10-10 DIAGNOSIS — Z59819 Housing instability, housed unspecified: Secondary | ICD-10-CM | POA: Insufficient documentation

## 2023-10-10 LAB — URINALYSIS WITH CULTURE REFLEX, WHEN INDICATED
Bilirubin: NEGATIVE
Leuk Esterase: NEGATIVE Leu/uL
Nitrite: NEGATIVE
Specific Gravity: 1.04 — ABNORMAL HIGH (ref 1.002–1.030)
Urobilinogen: NEGATIVE
pH: 6.5 (ref 5.0–8.0)

## 2023-10-10 LAB — COMPREHENSIVE METABOLIC PANEL, BLOOD
ALT (SGPT): 23 U/L (ref 0–41)
AST (SGOT): 19 U/L (ref 0–40)
Albumin: 4.3 g/dL (ref 3.5–5.2)
Alkaline Phos: 108 U/L (ref 40–129)
Anion Gap: 17 mmol/L — ABNORMAL HIGH (ref 7–15)
BUN: 14 mg/dL (ref 6–20)
Bicarbonate: 24 mmol/L (ref 22–29)
Bilirubin, Tot: 0.39 mg/dL (ref <1.2)
Calcium: 9.7 mg/dL (ref 8.5–10.6)
Chloride: 102 mmol/L (ref 98–107)
Creatinine: 0.74 mg/dL (ref 0.67–1.17)
Glucose: 208 mg/dL — ABNORMAL HIGH (ref 70–99)
Potassium: 4 mmol/L (ref 3.5–5.1)
Sodium: 143 mmol/L (ref 136–145)
Total Protein: 7.5 g/dL (ref 6.0–8.0)
eGFR Based on CKD-EPI 2021 Equation: >60 mL/min/{1.73_m2}

## 2023-10-10 LAB — CBC WITH DIFF, BLOOD
ANC-Automated: 8 10*3/uL — ABNORMAL HIGH (ref 1.6–7.0)
Abs Basophils: 0 10*3/uL (ref <0.2)
Abs Eosinophils: 0 10*3/uL (ref 0.0–0.5)
Abs Lymphs: 0.6 10*3/uL — ABNORMAL LOW (ref 0.8–3.1)
Abs Monos: 0.2 10*3/uL (ref 0.2–0.8)
Basophils: 0.2 %
Eosinophils: 0 %
Hct: 39.9 % — ABNORMAL LOW (ref 40.0–50.0)
Hgb: 13 g/dL — ABNORMAL LOW (ref 13.7–17.5)
Imm Gran %: 0.5 % (ref <1)
Imm Gran Abs: 0 10*3/uL (ref <0.1)
Lymphocytes: 7.1 %
MCH: 30.5 pg (ref 26.0–32.0)
MCHC: 32.6 g/dL (ref 32.0–36.0)
MCV: 93.7 um3 (ref 79.0–95.0)
MPV: 11.3 fL (ref 9.4–12.4)
Monocytes: 2.2 %
Plt Count: 195 10*3/uL (ref 140–370)
RBC: 4.26 10*6/uL — ABNORMAL LOW (ref 4.60–6.10)
RDW: 13 % (ref 12.0–14.0)
Segs: 90 %
WBC: 8.8 10*3/uL (ref 4.0–10.0)

## 2023-10-10 LAB — VENOUS BLOOD GAS
BE, Ven: 3 mmol/L — ABNORMAL HIGH (ref <=1.2)
FIO2, Ven: 21 %
HCO3, Ven: 27 mmol/L (ref 25–30)
O2 Sat, Ven (Est): 81.7 %
Temp, Ven: 36.7 Cel
pCO2, Ven (T): 48 mmHg (ref 40–52)
pCO2, Ven (Uncorr): 49 mmHg (ref 40–52)
pH, Ven (T): 7.38 (ref 7.33–7.40)
pH, Ven (Uncorr): 7.38 (ref 7.33–7.40)
pO2, Ven (T): 46 mmHg — ABNORMAL HIGH (ref 25–44)
pO2, Ven (Uncorr): 47 mmHg — ABNORMAL HIGH (ref 25–44)

## 2023-10-10 LAB — GLUCOSE (POCT)
Glucose (POCT): 172 mg/dL — ABNORMAL HIGH (ref 70–99)
Glucose (POCT): 179 mg/dL — ABNORMAL HIGH (ref 70–99)
Glucose (POCT): 162 mg/dL — ABNORMAL HIGH (ref 70–99)

## 2023-10-10 LAB — CKMB+INDEX (NO CPK), BLOOD
CK-MB Index: 2.2 %
CK-MB Index: 2.8 %
CK-MB: 2 ng/mL (ref 0–5)
CK-MB: 3 ng/mL (ref 0–5)

## 2023-10-10 LAB — PHOSPHORUS, BLOOD: Phosphorous: 3.8 mg/dL (ref 2.7–4.5)

## 2023-10-10 LAB — CPK-CREATINE PHOSPHOKINASE, BLOOD
CPK: 91 U/L (ref 0–175)
CPK: 107 U/L (ref 0–175)

## 2023-10-10 LAB — TROPONIN T GEN 5 W/REFLEX TO CK/CKMB
Troponin T Gen 5 w/Reflex CK/CKMB: 16 ng/L (ref <22)
Troponin T Gen 5 w/Reflex CK/CKMB: 18 ng/L (ref <22)

## 2023-10-10 LAB — MAGNESIUM, BLOOD: Magnesium: 1.8 mg/dL (ref 1.6–2.6)

## 2023-10-10 LAB — HCV ANTIBODY WITH REFLEX QUANT: Hepatitis C Ab: NONREACTIVE

## 2023-10-10 LAB — LIPASE, BLOOD: Lipase: 30 U/L (ref 13–60)

## 2023-10-10 LAB — BETA-HYDROXYBUTYRATE: Beta-Hydroxybutyrate: 4.4 mg/dL — ABNORMAL HIGH (ref <2.8)

## 2023-10-10 MED ORDER — PROCHLORPERAZINE EDISYLATE 5 MG/ML IJ SOLN WRAPPED RECORD
10.0000 mg | Freq: Once | INTRAMUSCULAR | Status: AC
Start: 2023-10-10 — End: 2023-10-10
  Administered 2023-10-10: 12:00:00 10 mg via INTRAVENOUS
  Filled 2023-10-10: qty 2

## 2023-10-10 MED ORDER — MORPHINE SULFATE 4 MG/ML IJ SOLN
4.0000 mg | Freq: Once | INTRAMUSCULAR | Status: AC
Start: 2023-10-10 — End: 2023-10-10
  Administered 2023-10-10: 13:00:00 4 mg via INTRAVENOUS
  Filled 2023-10-10: qty 1

## 2023-10-10 MED ORDER — FAMOTIDINE (PF) 20 MG/2ML IV SOLN
20.0000 mg | Freq: Once | INTRAVENOUS | Status: AC
Start: 2023-10-10 — End: 2023-10-10
  Administered 2023-10-10: 13:00:00 20 mg via INTRAVENOUS
  Filled 2023-10-10: qty 2

## 2023-10-10 MED ORDER — MECLIZINE HCL 25 MG OR TABS
25.0000 mg | ORAL_TABLET | Freq: Once | ORAL | Status: AC
Start: 2023-10-10 — End: 2023-10-10
  Administered 2023-10-10: 11:00:00 25 mg via ORAL
  Filled 2023-10-10: qty 1

## 2023-10-10 MED ORDER — IOHEXOL 350 MG/ML IV SOLN
500.0000 mL | Freq: Once | INTRAVENOUS | Status: AC
Start: 2023-10-10 — End: 2023-10-10
  Administered 2023-10-10: 11:00:00 100 mL via INTRAVENOUS
  Filled 2023-10-10: qty 500

## 2023-10-10 MED ORDER — LIDOCAINE 4 % EX PTCH
1.0000 | MEDICATED_PATCH | Freq: Once | CUTANEOUS | Status: DC
Start: 2023-10-10 — End: 2023-10-10
  Administered 2023-10-10: 13:00:00 1 via TRANSDERMAL
  Filled 2023-10-10: qty 1

## 2023-10-10 MED ORDER — FAMOTIDINE 20 MG OR TABS
20.0000 mg | ORAL_TABLET | Freq: Two times a day (BID) | ORAL | 0 refills | Status: AC
Start: 2023-10-10 — End: ?

## 2023-10-10 MED ORDER — ONDANSETRON HCL 4 MG/2ML IV SOLN
4.0000 mg | Freq: Once | INTRAMUSCULAR | Status: AC
Start: 2023-10-10 — End: 2023-10-10
  Administered 2023-10-10: 10:00:00 4 mg via INTRAVENOUS
  Filled 2023-10-10: qty 2

## 2023-10-10 MED ORDER — ACETAMINOPHEN 325 MG PO TABS
975.0000 mg | ORAL_TABLET | Freq: Once | ORAL | Status: AC
Start: 2023-10-10 — End: 2023-10-10
  Administered 2023-10-10: 13:00:00 975 mg via ORAL
  Filled 2023-10-10: qty 3

## 2023-10-10 MED ORDER — INSULIN REGULAR HUMAN 100 UNIT/ML IJ SOLN
4.0000 [IU] | Freq: Once | INTRAMUSCULAR | Status: AC
Start: 2023-10-10 — End: 2023-10-10
  Administered 2023-10-10: 14:00:00 4 [IU] via SUBCUTANEOUS
  Filled 2023-10-10: qty 4

## 2023-10-10 MED ORDER — LABETALOL HCL 5 MG/ML IV SOLN
20.0000 mg | Freq: Once | INTRAVENOUS | Status: AC
Start: 2023-10-10 — End: 2023-10-10
  Administered 2023-10-10: 11:00:00 20 mg via INTRAVENOUS
  Filled 2023-10-10: qty 4

## 2023-10-10 MED ORDER — LANSOPRAZOLE 15 MG PO TBDD
15.0000 mg | DELAYED_RELEASE_TABLET | Freq: Once | ORAL | Status: AC
Start: 2023-10-10 — End: 2023-10-10
  Administered 2023-10-10: 12:00:00 15 mg via ORAL
  Filled 2023-10-10: qty 1

## 2023-10-10 MED ORDER — LACTATED RINGERS IV SOLN
Freq: Once | INTRAVENOUS | Status: AC
Start: 2023-10-10 — End: 2023-10-10

## 2023-10-10 NOTE — Progress Notes (Signed)
Encounter opened to query outside prescription records.      Lanie Schelling, PharmD

## 2023-10-10 NOTE — ED Notes (Signed)
10/10/2023 12:54 PM Joshua Harper    A [employer] Implantable Device Interrogation was completed. Interrogation report was handed to Dr. Anibal Henderson.

## 2023-10-10 NOTE — ED MD Progress Note (Addendum)
Medicine believes likely gastroparesis 2/2 lack of access to insulin for several days. Requests insulin, po trial - if fails po will re-eval for admit. D/w pharmacy as patient normally on 10 units of Humalog. In light of glucose 208 and decreased po status will adm regular insulin 4 units per recommendation of pharmacy and po trial the patient. Will monitor him closely in the ED for hypoglycemia

## 2023-10-10 NOTE — ED Notes (Signed)
Pt a&ox4 awake and alert answering all questions appropriately. Pt ambulatory with steady gait. Pt verbalizes understanding of discharge instructions and will follow up with pmd. Pt verbalizes understanding to return to the ED for worsening symptoms. Pt has no further questions at this time. Patient declined final VS    -AVS paperwork signed by the patient and placed in medical records container at the front nurses station

## 2023-10-10 NOTE — EMS Narrative (Addendum)
 EMS note for [id number] by M21, last updated at 09:24 on 2023-10-10  M21, [facility]     1897-11-28     52 Years     68 kg                      Patient's symptom(s):  R53.1 - Weakness                      Paramedic's impression(s):  R53.1 - Weakness  G89.1 - Acute pain due to trauma                      M21 onscene w/ E3 finding a 52 YO M laying supine on a parking lot with a   sheet over him and a pillow behind his head w/ a C/C: of N/V, generalized   weakness.  Pt has been having N/V since this morning. Pt went outside and   fell after "feeling weak." Pt has been throwing up yellow bile. Pt denies   any neck or back pain. Pt denies taking blood thinners. Pt is A/0x4. No LOC   suspected, no CP, dyspnea, dizziness. No head, neck, back, extremity pain   x4. Pt is positive for N/V, abdominal pain. On a secondary assessment, no   trauma is noted. Pts abdominal feels soft. Assessment done, V/S, 12-lead   EKG to rule out cardiac abnormalities, BGL to rule out glycemic issues,   temperature, SPO2, 4mg  Zofran IM done by E3 PTA, IV established 20g left   forearm, NS bolus IV, 1000mg  Acetaminophen IV over 15 mins, comfort   measures= Pt was monitored for further changes. Pt was transported to [employer] "requested" code 20 and care was turned over to the receiving RN   w/o further changes. Pts belongings, all left w/ the pt and RN in room.                       Patient's Medical History:  E11.8 - Type 2 diabetes mellitus with unspecified complications                      Patient's Known Allergies:  [id number] - No known allergies  [id number] -                       Paramedic Physical Exam:  Abdomen (Generalized) Assessment: Pain  Mental Status Assessment: Oriented-Event  Mental Status Assessment: Oriented-Place  Mental Status Assessment: Oriented-Person  Mental Status Assessment: Oriented-Time  Neurological Assessment: Normal Baseline for Patient                      Paramedic  Procedures:  09:04 - 3-Lead ECG Monitored: Yes  08:49 - 12-Lead ECG Completed: Yes  09:00 - Intravenous Catheter Inserted: Yes                      EMS Response Details:  EMS responded at 08:37  EMS arrived at 08:50

## 2023-10-10 NOTE — ED EKG Interpretation (Signed)
ED EKG Interpretation  Sinus rhythm rate 95 first degree AV block prolonged QT, LVH noted, RSR pattern V2, abnormal but not consistent with STEMI. Borderline STE elevation in lead V2 present on prior EKG 03/29/23

## 2023-10-10 NOTE — ED Provider Notes (Signed)
ED Provider Note  [place] electronic medical record reviewed for pertinent medical history.     Joshua Harper DOB: 1971/06/27 PMD: No Pcp, Could Not Be Validated     Chief Complaint   Patient presents with   . Vomiting     Pt to ED from outside for c/o general weakness N/V constipation for 2 days. Pt arrives to ED in NAD Aox3 HTN Hx of DM. Given medication enroute to ED. PT transferred to ED cart by EMS.        HPI: Joshua Harper is a 52 year old male who has a past medical history of AICD (automatic cardioverter/defibrillator) present, Diabetes mellitus (CMS-HCC), and Hypertension., constipation, depression, marijuana use, cocaine use, HFrEF, housing instability, pancreatitis presenting with 1d N/V from [place].     States yesterday started vomiting and since has vomitted 8-10x. States zofran in EMS helped with nausea, but is still experiencing belly discomfort, states feels all over, not pain, but significant discomfort. States was constipated without a BM without a week, yesterday had a large BM, since then has had one BM this AM. States has had on and off constipation for past 6 months. States has been eating okay, full meals daily, able to stay hydrated, until nausea yesterday. States vomit looked like food yesterday and today looks like yellow bile. Denies any blood in vomit or stool.     States has been feeling unsteady on feet for past week. Denies any LOC or head trauma. States for the past 2 weeks has been feeling "out of it" and "in a haze", states feels like he has had a concussion but reports no trauma.     States has been taking all medications consistently (statin, jardiance, lexapro, metop, remeron, entresto, spironolactone) other than his insulin. States has not taken his insulin in ~1 week. States he has gone without his insulin prior and never felt like this. Also notes he did not take his entresto this AM, last took all meds last night.     States had transient HA yesterday, none today. Denies  lightheadedness/dizziness, endorses generalized weakness.     This patient arrived by EMS. I have directly reviewed the EMS Run Report and specifically reviewed the on-scene events, vital signs and medications administered. Information obtained from EMS report: N/V/generalized weakness, given zofran, 500cc bolus, tylenol, POC BG >200, elevated BP 200s/100s.      Pertinent Medical History:    PMHx: As above    Past Surgical History:   Procedure Laterality Date   . CARDIAC DEFIBRILLATOR PLACEMENT         No family history on file.    Current Outpatient Medications   Medication Instructions   . atorvastatin (LIPITOR) 80 mg, Oral, DAILY   . empagliflozin ([name]) 10 mg, Oral, DAILY   . escitalopram (LEXAPRO) 5 mg, Oral, DAILY   . insulin aspart (NOVOLOG) 10-15 Units, Subcutaneous, 3 TIMES DAILY BEFORE MEALS   . insulin glargine (LANTUS) 10 Units, Subcutaneous, NIGHTLY   . metoprolol tartrate (LOPRESSOR) 50 mg, Oral, DAILY   . mirtazapine (REMERON) 15 mg, Oral, NIGHTLY   . sacubitril-valsartan (ENTRESTO) 49-51 MG TABS tablet 1 tablet, Oral, 2 TIMES DAILY   . spironolactone (ALDACTONE) 12.5 mg, Oral, DAILY       Physical Exam  BP (!) 213/121   Pulse 109   Temp 98.1 F (36.7 C)   Resp 18   Ht 6\' 2"  (1.88 m)   Wt 72 kg (158 lb 11.7 oz)   SpO2  99%   BMI 20.38 kg/m   Physical Exam  Vitals reviewed.   Constitutional:       General: He is in acute distress.      Appearance: He is not diaphoretic.   HENT:      Head: Normocephalic and atraumatic.      Right Ear: External ear normal.      Left Ear: External ear normal.      Nose: Nose normal.      Mouth/Throat:      Mouth: Mucous membranes are moist.   Eyes:      General: No scleral icterus.        Right eye: No discharge.         Left eye: No discharge.      Extraocular Movements: Extraocular movements intact.      Conjunctiva/sclera: Conjunctivae normal.      Pupils: Pupils are equal, round, and reactive to light.   Cardiovascular:      Rate and Rhythm: Normal rate  and regular rhythm.   Pulmonary:      Effort: Pulmonary effort is normal.      Breath sounds: Normal breath sounds.   Abdominal:      General: Abdomen is flat. There is no distension.      Palpations: Abdomen is soft.      Tenderness: There is abdominal tenderness (epigastric and LLQ). There is no right CVA tenderness, left CVA tenderness, guarding or rebound.   Musculoskeletal:         General: No swelling, tenderness, deformity or signs of injury. Normal range of motion.      Cervical back: Normal range of motion and neck supple.      Right lower leg: No edema.      Left lower leg: No edema.   Skin:     General: Skin is warm and dry.      Capillary Refill: Capillary refill takes less than 2 seconds.   Neurological:      General: No focal deficit present.      Motor: Weakness present.      Gait: Gait abnormal.   Psychiatric:      Comments: "Out of it"         Orders/Medications    Orders Placed This Encounter   Procedures   . CT Head W/O Contrast   . CT Abdomen And Pelvis With Contrast   . CMP   . Lipase, Blood Green Plasma Separator Tube   . Magnesium, Blood Green Plasma Separator Tube   . Phosphorus, Blood Green Plasma Separator Tube   . CBC w/ Diff Lavender   . Urinalysis with Culture Reflex, when indicated   . Beta-Hydroxybutyrate PST Light Green   . Venous Blood Gas Heparin Syringe   . Troponin T Gen 5 w/Reflex to CK/CKMB Green Plasma Separator Tube       Medications   lactated ringers 1,000 mL IV bolus ( IntraVENOUS New Bag 10/10/23 1010)   labetalol (NORMODYNE) injection 20 mg (has no administration in time range)   meclizine (ANTIVERT) tablet 25 mg (has no administration in time range)   lansoprazole (PREVACID SOLUTAB) disintegrating tablet 15 mg (has no administration in time range)   ondansetron (ZOFRAN) injection 4 mg (4 mg IntraVENOUS Given 10/10/23 1012)   iohexol (OMNIPAQUE 350) 350 MG/ML solution 500 mL (100 mL IntraVENOUS Given 10/10/23 1056)         Medical Decision Making/Assessment/Plan    This  is a(n) 52 year old  male who has a past medical history of AICD (automatic cardioverter/defibrillator) present, Diabetes mellitus (CMS-HCC), and Hypertension. and presents with acute N/V/abdominal discomfort, hypertensive urgency vs emergency, and subacute generalized weakness and unsteadiness.     This patient presents with hyperglycemia and symptoms concerning for DKA.  Most likely cause is medication noncompliance.  Low suspicion for sepsis due to patient is afebrile without leukocytosis, no obvious signs of soft tissue infections, patient denies other infectious symptoms  Low suspicion for cerebral edema as the patient is not a child, is A and Ox4 and mentating appropriately, not complaining of headache, however given pt's unsteady gait and feeling out of it, will obtain head imaging.     Considered Biliary pathology such as cholecystitis, cholangitis, cholelithiasis, choledocholithiasis: Lower suspicion as pain not worse in postprandial period, negative Murphy's, patient not jaundiced - will get basic labs  Considered PUD, Gastritis, given epigastric pain, will give PPI.  Considered Diverticulitis, unlikely given no prior history and would be first presentation  Considered Colitis/Gastroenteritis, unlikely given no recent travel, no recent antibiotic use  Considered SBO, unlikely given patient is passing gas, last BM was this AM, patient not vomiting in the ED  Considered referred pain from cardiac etiology, unlikely given patient does not have exertional pain, does not have chest pain/SOB   Considered lower lobe pneumonia, unlikely given patient without cough, without sputum, without fever  Considered Hepatitis, unlikely given denies IVDU, no recent travel  Considered Pancreatitis, unlikely given no ongoing alcohol use or known gallstones, no new medications - will obtain lipase  Considered Appendicitis, unlikely given no anorexia, no N/V/D, no tenderness in RLQ  Considered IBS, given chronic off and on  constipation  Doubt Hernia, no evidence on exam  Considered Nephrolithiasis, unlikely given no history of kidney stones, UA pending  Considered Pyelonephritis or cystitis or prostatitis, UA pending  Considered mesenteric ischemia, pending CTAP  Considered AAA, pending CTAP  Considered ruptured splenic artery aneurysm, unlikely given due to patient not in severe pain and has relatively normal hemoglobin  Considered aortic dissection, patient is hypertensive however without ripping/tearing quality of pain to the back, no pulse deficit  Considered perforated viscus or peritonitis, unlikely given based on exam with soft abdomen, no peritoneal signs  Considered malignancy due to patient's age and no history of malignancy, no recent weight loss/night sweats  Labs pending to consider hypercalcemia  Doubt Testicular torsion, orchitis, patient without testicular pain on history or tenderness on exam, normal vertical lie    W/u pending CMP, CBC, VBG, UA beta hydroxybutyrate.  Pain and nausea managed with analgesics and antiemetics as appropriate.  IVF given and patient made NPO during workup.  CT Abd indicated at this time.     Dispo pending initial workup.  Initial treatment if c/f DKA/HHS includes IVF resuscitation, zofran for nausea if present. When glucose drops below 250 mg/dL, will add W0J.  If K >3.3, insulin infusion starting at 0.1 unit/kg/hour in conjunction with potassium repletion.  Will continue treatment until following are present on repeat BMP: serum bicarbonate >=15 mEq, pH >7.3, Anion gap <=12 mEq/L.    If patient becomes altered or shows signs of cerebral edema, then fluids will be stopped, HOB elevated >30 deg, and will consider osmotic diuresis.    Regarding hypertension, unclear of hypertensive urgency vs emergency, Will get basic labs, UA, EKG, CXR to eval for end organ dysfunction, pulm edema 2/2 HTN.  High concern for Medication noncompliance  Low concern for renal artery stenosis -  will get basic  labs to eval kidney Fx.  Low concern for CVA - no focal neuro findings.  Low concern for drug use - pt denies.  Low concern for hyperthyroid - pt denies thryoid pathology.  Low concern for pain etiology - pt denies acute pain.  Low concern for sequale: end organ dysfunction - AKI/preeclampsia, pulmonary edema, hypertensive encephalopathy, microangiopathic hemolytic anemia    Will give x1 labetalol while evaluating.     Dispo pending w/u.     Dx/tx limited by SDOH: Problems related to housing and economic: Problems related to housing Other specified. These factors potentially limit the patient's ability to obtain adequate follow up and/or comply with treatment. Housing instability.    ED Course/Updates/Disposition  ED Course as of 10/10/23 1734   [facility] Documentation   Tue Oct 10, 2023   1021 Blood pressure (BP)(!): 213/121  BP significantly elevated 213/121   1022 CBC w/ Diff Lavender(!)  CBC largely WNL, mildly anemic Hgb 13   1042 Glucose(!): 208  Glucose 208, known T2DM reported to insulin use for past week   1043 Anion Gap(!): 17   1058 Urinalysis with Culture Reflex, when indicated(!)  4+ glucose, 3+ protein, 1+ ketones, 1+ blood   1131 Venous Blood Gas Heparin Syringe(!)  VBG largely WNL   1133 CT Head W/O Contrast  "No acute intracranial hemorrhage, mass effect, hydrocephalus, extra-axial collection, or evidence of large territorial infarct."   1133 Blood pressure (BP)(!): 183/109  S/p x1 labetalol with some reduction in BP   1231 Beta-Hydroxybutyrate(!): 4.4  Elevated serum ketones   1231 CT Abdomen And Pelvis With Contrast  Unremarkable.   1232 Has had continued nausea and abdominal/back pain.    1320 Upon reevaluation s/p pain medication, is sleepy, not acutely feeling any abdominal pain and not feeling nausea.   1406 GLUCOSE (POCT)(!)  BG reduced s/p 4u insulin, 172   1458 Blood pressure (BP): 157/97   1650 Passed PO trial, pending x2 poct bg s/p insulin and ambulatory trial   1726 Discussed  home insulin regimen with patient and pharmacist, patient verbalized he has an appointment with his primary care next week to adjust regimen, will discharge on same regimen for now and encouraged presenting for PCP follow up.   1733 Ambulating independently at baseline, ok to discharge with counseling on diabetes medication adherence.       Others' Documentation   Tue Oct 10, 2023   1425 Sign out note  Gastroparesis   Unsteady gait, old, stable  Po trial, if fails, then admit to Medicine [AK]      ED Course User Index  [AK] Serita Grit, MD         ED Clinical Impression as of 10/10/23 1734   Bilious vomiting with nausea   Hypertensive urgency   Weakness generalized             Johnette Abraham, MD  Resident  10/10/23 1131       Larena Sox, MD  10/10/23 1235

## 2023-10-10 NOTE — ED Notes (Signed)
Pt tolerated PO challenge

## 2023-10-11 LAB — ECG 12-LEAD
ATRIAL RATE: 95 {beats}/min
ECG INTERPRETATION: NORMAL
P AXIS: 86 degrees
PR INTERVAL: 206 ms
QRS INTERVAL/DURATION: 112 ms
QT: 380 ms
QTc (Bazett): 477 ms
QTc (Fredericia): 442 ms
R AXIS: 55 degrees
T AXIS: 81 degrees
VENTRICULAR RATE: 95 {beats}/min

## 2023-12-26 ENCOUNTER — Ambulatory Visit: Payer: Medicaid Other

## 2024-03-26 ENCOUNTER — Ambulatory Visit: Payer: Medicaid Other

## 2024-06-25 ENCOUNTER — Ambulatory Visit: Payer: Medicaid Other

## 2024-09-17 ENCOUNTER — Telehealth: Payer: Self-pay

## 2024-09-17 NOTE — Telephone Encounter (Addendum)
-----   Message from Lilyan ORN sent at 09/17/2024  3:55 PM EDT ----- Regarding: Issues with Transferring Out Called for DC monitor.  Pt stated he lives in [place] and is trying to transfer his care.  Stated a device tech has tried to reach your clinic for 30 mins today.  Pt is still on device site and showing that he has been released.  I updated PA to T and verified that the is no Epic schedule.  ______________________________________________________________________   Beatris w/ patient regarding transfer of care. Notified patient he has been released from our system. Informed patient that the facility he is transferring to needs to pull him over in the [employer]. Patient states transfer of care facility is [facility]. Patient request to call back on 09/18/2024 as his hands was full and unable to talk.   Will plan to call back and verify new facility and phone number associated in order to call and confirm release.

## 2024-09-18 NOTE — Telephone Encounter (Signed)
 Attempted outreach to Pt.  Call went to [facility].  [facility] is full.  Pt has been released by this clinic in [facility].  New clinic only needs to go into [employer] [facility] and enroll Pt.  Pt has direct number to our device clinic if further issues.  Would recommend he have his new clinic contact [facility] if they are having difficulty enrolling Pt.  Await further needs.
